# Patient Record
Sex: Male | Born: 1935 | Race: Black or African American | Hispanic: No | Marital: Married | Smoking: Former smoker
Health system: Southern US, Community
[De-identification: ages and names within clinical notes are randomized; demographics above are authoritative.]

## PROBLEM LIST (undated history)

## (undated) DIAGNOSIS — I714 Abdominal aortic aneurysm, without rupture, unspecified: Secondary | ICD-10-CM

## (undated) DIAGNOSIS — R066 Hiccough: Secondary | ICD-10-CM

## (undated) DIAGNOSIS — I10 Essential (primary) hypertension: Secondary | ICD-10-CM

## (undated) DIAGNOSIS — K449 Diaphragmatic hernia without obstruction or gangrene: Secondary | ICD-10-CM

## (undated) DIAGNOSIS — K5790 Diverticulosis of intestine, part unspecified, without perforation or abscess without bleeding: Secondary | ICD-10-CM

## (undated) DIAGNOSIS — N184 Chronic kidney disease, stage 4 (severe): Secondary | ICD-10-CM

## (undated) DIAGNOSIS — J449 Chronic obstructive pulmonary disease, unspecified: Secondary | ICD-10-CM

## (undated) DIAGNOSIS — I2510 Atherosclerotic heart disease of native coronary artery without angina pectoris: Secondary | ICD-10-CM

## (undated) DIAGNOSIS — K219 Gastro-esophageal reflux disease without esophagitis: Secondary | ICD-10-CM

## (undated) DIAGNOSIS — K222 Esophageal obstruction: Secondary | ICD-10-CM

## (undated) DIAGNOSIS — J850 Gangrene and necrosis of lung: Secondary | ICD-10-CM

## (undated) DIAGNOSIS — R911 Solitary pulmonary nodule: Secondary | ICD-10-CM

## (undated) DIAGNOSIS — D649 Anemia, unspecified: Secondary | ICD-10-CM

## (undated) DIAGNOSIS — C61 Malignant neoplasm of prostate: Secondary | ICD-10-CM

## (undated) DIAGNOSIS — I480 Paroxysmal atrial fibrillation: Secondary | ICD-10-CM

## (undated) HISTORY — DX: Abdominal aortic aneurysm, without rupture: I71.4

## (undated) HISTORY — DX: Gangrene and necrosis of lung: J85.0

## (undated) HISTORY — DX: Atherosclerotic heart disease of native coronary artery without angina pectoris: I25.10

## (undated) HISTORY — PX: CORONARY STENT PLACEMENT: SHX1402

## (undated) HISTORY — DX: Anemia, unspecified: D64.9

## (undated) HISTORY — DX: Essential (primary) hypertension: I10

## (undated) HISTORY — DX: Chronic obstructive pulmonary disease, unspecified: J44.9

## (undated) HISTORY — DX: Hiccough: R06.6

## (undated) HISTORY — DX: Abdominal aortic aneurysm, without rupture, unspecified: I71.40

## (undated) HISTORY — DX: Diaphragmatic hernia without obstruction or gangrene: K44.9

## (undated) HISTORY — DX: Esophageal obstruction: K22.2

## (undated) HISTORY — DX: Solitary pulmonary nodule: R91.1

---

## 1998-01-28 ENCOUNTER — Ambulatory Visit (HOSPITAL_COMMUNITY): Admission: RE | Admit: 1998-01-28 | Discharge: 1998-01-28 | Payer: Self-pay | Admitting: Gastroenterology

## 1998-02-14 ENCOUNTER — Ambulatory Visit (HOSPITAL_COMMUNITY): Admission: RE | Admit: 1998-02-14 | Discharge: 1998-02-14 | Payer: Self-pay | Admitting: Gastroenterology

## 1998-09-18 ENCOUNTER — Encounter: Payer: Self-pay | Admitting: Gastroenterology

## 1998-09-18 ENCOUNTER — Ambulatory Visit (HOSPITAL_COMMUNITY): Admission: RE | Admit: 1998-09-18 | Discharge: 1998-09-18 | Payer: Self-pay | Admitting: Gastroenterology

## 1998-10-02 ENCOUNTER — Ambulatory Visit (HOSPITAL_COMMUNITY): Admission: RE | Admit: 1998-10-02 | Discharge: 1998-10-02 | Payer: Self-pay | Admitting: Gastroenterology

## 1998-11-04 ENCOUNTER — Ambulatory Visit (HOSPITAL_COMMUNITY): Admission: RE | Admit: 1998-11-04 | Discharge: 1998-11-04 | Payer: Self-pay | Admitting: Gastroenterology

## 1998-12-02 ENCOUNTER — Ambulatory Visit (HOSPITAL_COMMUNITY): Admission: RE | Admit: 1998-12-02 | Discharge: 1998-12-02 | Payer: Self-pay | Admitting: Gastroenterology

## 1999-05-27 ENCOUNTER — Ambulatory Visit (HOSPITAL_COMMUNITY): Admission: RE | Admit: 1999-05-27 | Discharge: 1999-05-27 | Payer: Self-pay | Admitting: Gastroenterology

## 1999-07-22 ENCOUNTER — Ambulatory Visit (HOSPITAL_COMMUNITY): Admission: RE | Admit: 1999-07-22 | Discharge: 1999-07-22 | Payer: Self-pay | Admitting: Gastroenterology

## 2000-01-28 ENCOUNTER — Ambulatory Visit (HOSPITAL_COMMUNITY): Admission: RE | Admit: 2000-01-28 | Discharge: 2000-01-28 | Payer: Self-pay | Admitting: Gastroenterology

## 2000-04-05 ENCOUNTER — Encounter: Admission: RE | Admit: 2000-04-05 | Discharge: 2000-04-05 | Payer: Self-pay | Admitting: Gastroenterology

## 2000-04-05 ENCOUNTER — Encounter: Payer: Self-pay | Admitting: Gastroenterology

## 2000-06-07 ENCOUNTER — Ambulatory Visit (HOSPITAL_COMMUNITY): Admission: RE | Admit: 2000-06-07 | Discharge: 2000-06-07 | Payer: Self-pay | Admitting: Gastroenterology

## 2000-10-26 ENCOUNTER — Ambulatory Visit (HOSPITAL_COMMUNITY): Admission: RE | Admit: 2000-10-26 | Discharge: 2000-10-26 | Payer: Self-pay | Admitting: Gastroenterology

## 2000-11-11 ENCOUNTER — Encounter: Payer: Self-pay | Admitting: Cardiology

## 2000-11-11 ENCOUNTER — Ambulatory Visit (HOSPITAL_COMMUNITY): Admission: RE | Admit: 2000-11-11 | Discharge: 2000-11-11 | Payer: Self-pay | Admitting: Cardiology

## 2001-03-06 ENCOUNTER — Encounter: Payer: Self-pay | Admitting: Cardiology

## 2001-03-06 ENCOUNTER — Ambulatory Visit (HOSPITAL_COMMUNITY): Admission: RE | Admit: 2001-03-06 | Discharge: 2001-03-06 | Payer: Self-pay | Admitting: Cardiology

## 2001-05-09 ENCOUNTER — Ambulatory Visit (HOSPITAL_COMMUNITY): Admission: RE | Admit: 2001-05-09 | Discharge: 2001-05-09 | Payer: Self-pay | Admitting: Gastroenterology

## 2001-07-20 ENCOUNTER — Ambulatory Visit (HOSPITAL_COMMUNITY): Admission: RE | Admit: 2001-07-20 | Discharge: 2001-07-20 | Payer: Self-pay | Admitting: Ophthalmology

## 2001-09-06 HISTORY — PX: EYE SURGERY: SHX253

## 2001-10-02 ENCOUNTER — Ambulatory Visit (HOSPITAL_COMMUNITY): Admission: RE | Admit: 2001-10-02 | Discharge: 2001-10-02 | Payer: Self-pay | Admitting: Ophthalmology

## 2001-11-21 ENCOUNTER — Ambulatory Visit (HOSPITAL_COMMUNITY): Admission: RE | Admit: 2001-11-21 | Discharge: 2001-11-21 | Payer: Self-pay | Admitting: Gastroenterology

## 2002-05-25 ENCOUNTER — Ambulatory Visit (HOSPITAL_COMMUNITY): Admission: RE | Admit: 2002-05-25 | Discharge: 2002-05-25 | Payer: Self-pay | Admitting: Gastroenterology

## 2002-10-18 ENCOUNTER — Ambulatory Visit (HOSPITAL_COMMUNITY): Admission: RE | Admit: 2002-10-18 | Discharge: 2002-10-18 | Payer: Self-pay | Admitting: Gastroenterology

## 2003-05-15 ENCOUNTER — Ambulatory Visit (HOSPITAL_COMMUNITY): Admission: RE | Admit: 2003-05-15 | Discharge: 2003-05-15 | Payer: Self-pay | Admitting: Gastroenterology

## 2003-07-07 ENCOUNTER — Emergency Department (HOSPITAL_COMMUNITY): Admission: EM | Admit: 2003-07-07 | Discharge: 2003-07-07 | Payer: Self-pay | Admitting: Emergency Medicine

## 2003-07-22 ENCOUNTER — Ambulatory Visit (HOSPITAL_COMMUNITY): Admission: RE | Admit: 2003-07-22 | Discharge: 2003-07-22 | Payer: Self-pay | Admitting: Cardiology

## 2003-07-31 ENCOUNTER — Encounter: Admission: RE | Admit: 2003-07-31 | Discharge: 2003-07-31 | Payer: Self-pay | Admitting: General Surgery

## 2003-08-02 ENCOUNTER — Encounter: Admission: RE | Admit: 2003-08-02 | Discharge: 2003-08-02 | Payer: Self-pay | Admitting: General Surgery

## 2003-09-27 ENCOUNTER — Ambulatory Visit (HOSPITAL_COMMUNITY): Admission: RE | Admit: 2003-09-27 | Discharge: 2003-09-27 | Payer: Self-pay | Admitting: *Deleted

## 2003-11-20 ENCOUNTER — Inpatient Hospital Stay (HOSPITAL_COMMUNITY): Admission: RE | Admit: 2003-11-20 | Discharge: 2003-11-28 | Payer: Self-pay | Admitting: *Deleted

## 2003-12-12 ENCOUNTER — Encounter: Admission: RE | Admit: 2003-12-12 | Discharge: 2003-12-12 | Payer: Self-pay | Admitting: Gastroenterology

## 2003-12-16 ENCOUNTER — Ambulatory Visit (HOSPITAL_COMMUNITY): Admission: RE | Admit: 2003-12-16 | Discharge: 2003-12-16 | Payer: Self-pay | Admitting: Gastroenterology

## 2004-04-07 ENCOUNTER — Ambulatory Visit (HOSPITAL_COMMUNITY): Admission: RE | Admit: 2004-04-07 | Discharge: 2004-04-07 | Payer: Self-pay | Admitting: Gastroenterology

## 2004-08-04 ENCOUNTER — Ambulatory Visit (HOSPITAL_COMMUNITY): Admission: RE | Admit: 2004-08-04 | Discharge: 2004-08-04 | Payer: Self-pay | Admitting: Gastroenterology

## 2005-01-05 ENCOUNTER — Ambulatory Visit (HOSPITAL_COMMUNITY): Admission: RE | Admit: 2005-01-05 | Discharge: 2005-01-05 | Payer: Self-pay | Admitting: Gastroenterology

## 2005-03-01 ENCOUNTER — Encounter: Admission: RE | Admit: 2005-03-01 | Discharge: 2005-03-01 | Payer: Self-pay | Admitting: Cardiology

## 2005-06-29 ENCOUNTER — Ambulatory Visit (HOSPITAL_COMMUNITY): Admission: RE | Admit: 2005-06-29 | Discharge: 2005-06-29 | Payer: Self-pay | Admitting: Gastroenterology

## 2006-02-14 ENCOUNTER — Ambulatory Visit (HOSPITAL_COMMUNITY): Admission: RE | Admit: 2006-02-14 | Discharge: 2006-02-14 | Payer: Self-pay | Admitting: Gastroenterology

## 2006-02-14 ENCOUNTER — Encounter (INDEPENDENT_AMBULATORY_CARE_PROVIDER_SITE_OTHER): Payer: Self-pay | Admitting: Specialist

## 2006-06-15 ENCOUNTER — Ambulatory Visit (HOSPITAL_COMMUNITY): Admission: RE | Admit: 2006-06-15 | Discharge: 2006-06-15 | Payer: Self-pay | Admitting: Gastroenterology

## 2006-06-28 ENCOUNTER — Encounter: Admission: RE | Admit: 2006-06-28 | Discharge: 2006-06-28 | Payer: Self-pay | Admitting: Gastroenterology

## 2006-07-25 ENCOUNTER — Ambulatory Visit (HOSPITAL_COMMUNITY): Admission: RE | Admit: 2006-07-25 | Discharge: 2006-07-25 | Payer: Self-pay | Admitting: Gastroenterology

## 2006-11-07 ENCOUNTER — Ambulatory Visit (HOSPITAL_COMMUNITY): Admission: RE | Admit: 2006-11-07 | Discharge: 2006-11-07 | Payer: Self-pay | Admitting: Gastroenterology

## 2007-05-29 ENCOUNTER — Ambulatory Visit (HOSPITAL_COMMUNITY): Admission: RE | Admit: 2007-05-29 | Discharge: 2007-05-29 | Payer: Self-pay | Admitting: Gastroenterology

## 2007-07-03 ENCOUNTER — Ambulatory Visit (HOSPITAL_COMMUNITY): Admission: RE | Admit: 2007-07-03 | Discharge: 2007-07-03 | Payer: Self-pay | Admitting: Gastroenterology

## 2007-07-31 ENCOUNTER — Ambulatory Visit (HOSPITAL_COMMUNITY): Admission: RE | Admit: 2007-07-31 | Discharge: 2007-07-31 | Payer: Self-pay | Admitting: Gastroenterology

## 2007-09-11 ENCOUNTER — Inpatient Hospital Stay (HOSPITAL_COMMUNITY): Admission: EM | Admit: 2007-09-11 | Discharge: 2007-09-15 | Payer: Self-pay | Admitting: Emergency Medicine

## 2007-10-07 ENCOUNTER — Inpatient Hospital Stay (HOSPITAL_COMMUNITY): Admission: EM | Admit: 2007-10-07 | Discharge: 2007-10-18 | Payer: Self-pay | Admitting: Emergency Medicine

## 2008-02-19 ENCOUNTER — Inpatient Hospital Stay (HOSPITAL_COMMUNITY): Admission: AD | Admit: 2008-02-19 | Discharge: 2008-03-05 | Payer: Self-pay | Admitting: Gastroenterology

## 2008-02-29 ENCOUNTER — Ambulatory Visit: Payer: Self-pay | Admitting: Internal Medicine

## 2008-07-29 IMAGING — CR DG CHEST 1V PORT
2 series · 2 of 2 positions shown · non-contrast
Comparison: 09/12/07.

CLINICAL DATA: Atrial fibrillation and hypotension.  Syncope yesterday.  
AP PORTABLE SEMIERECT CHEST ? 1 VIEW 10/07/07 AT [DATE]:

[AP (1 of 2)]
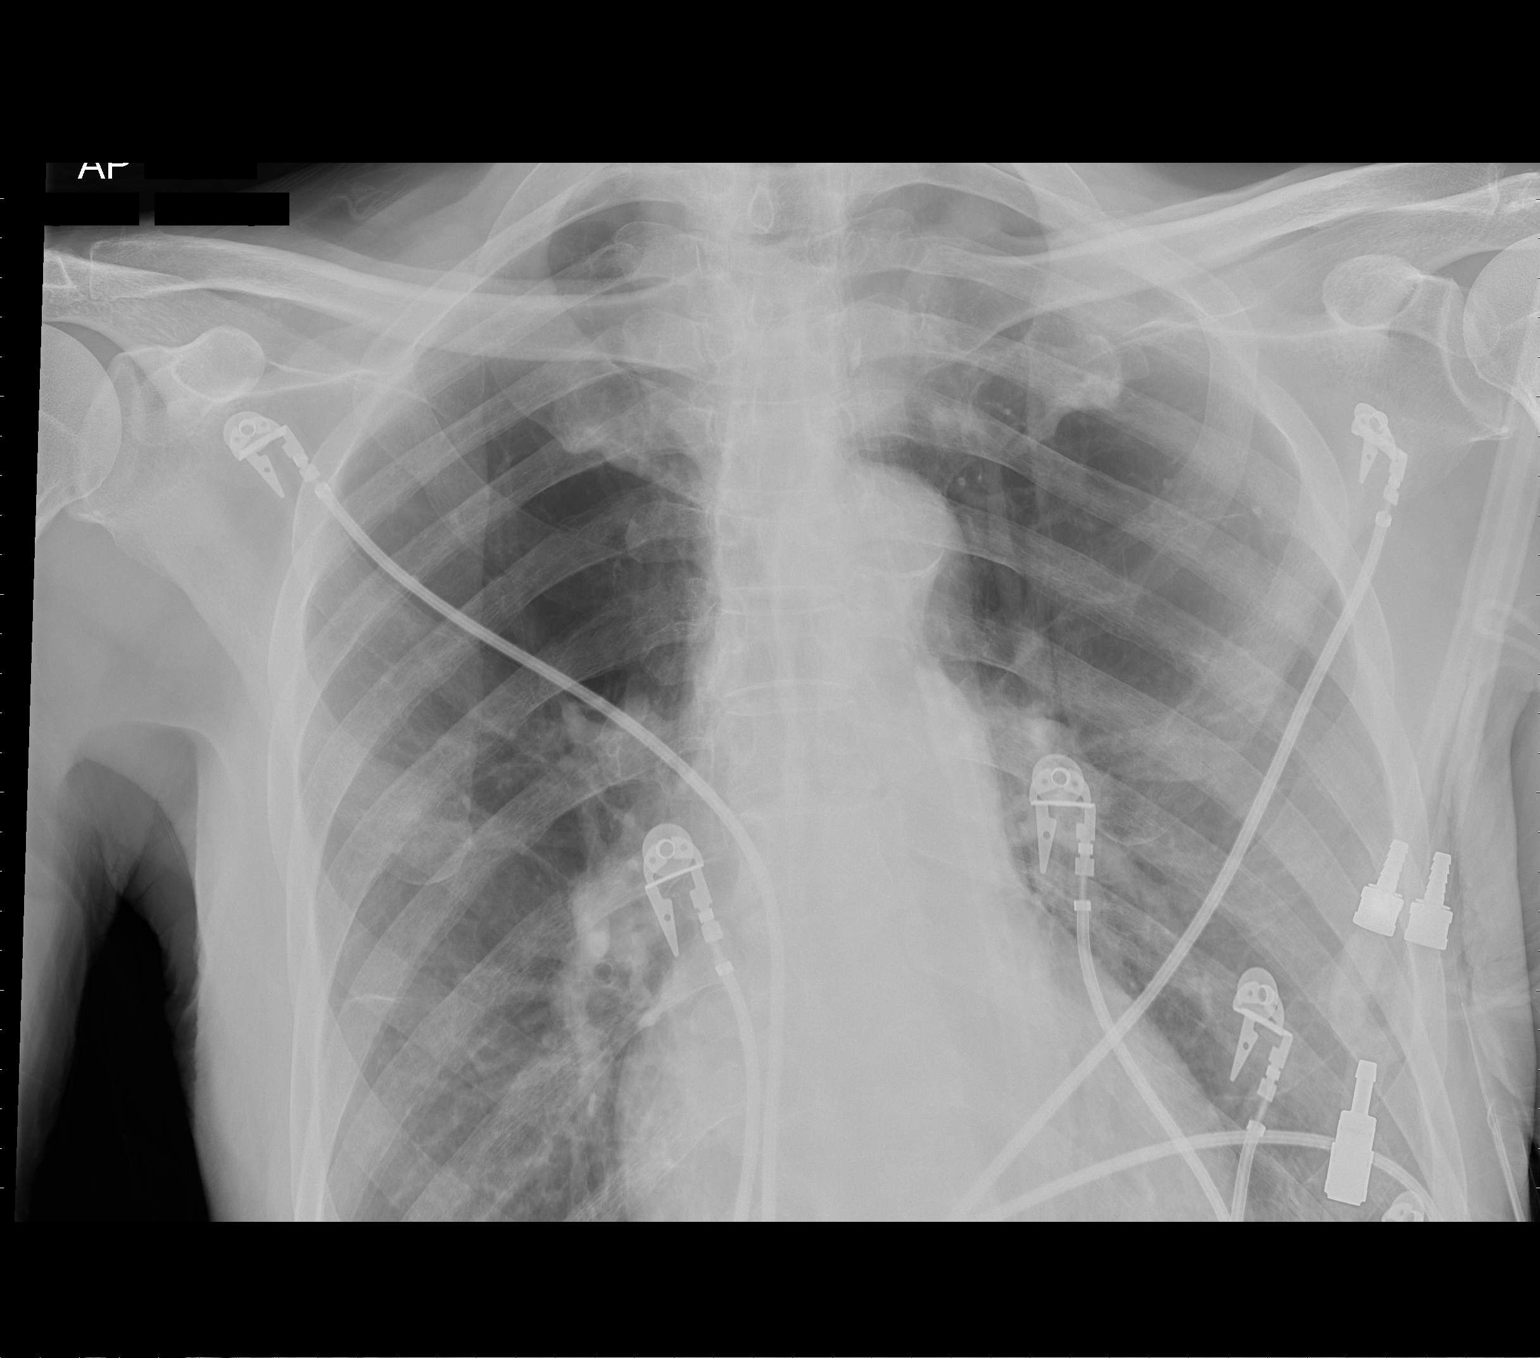

[AP (2 of 2)]
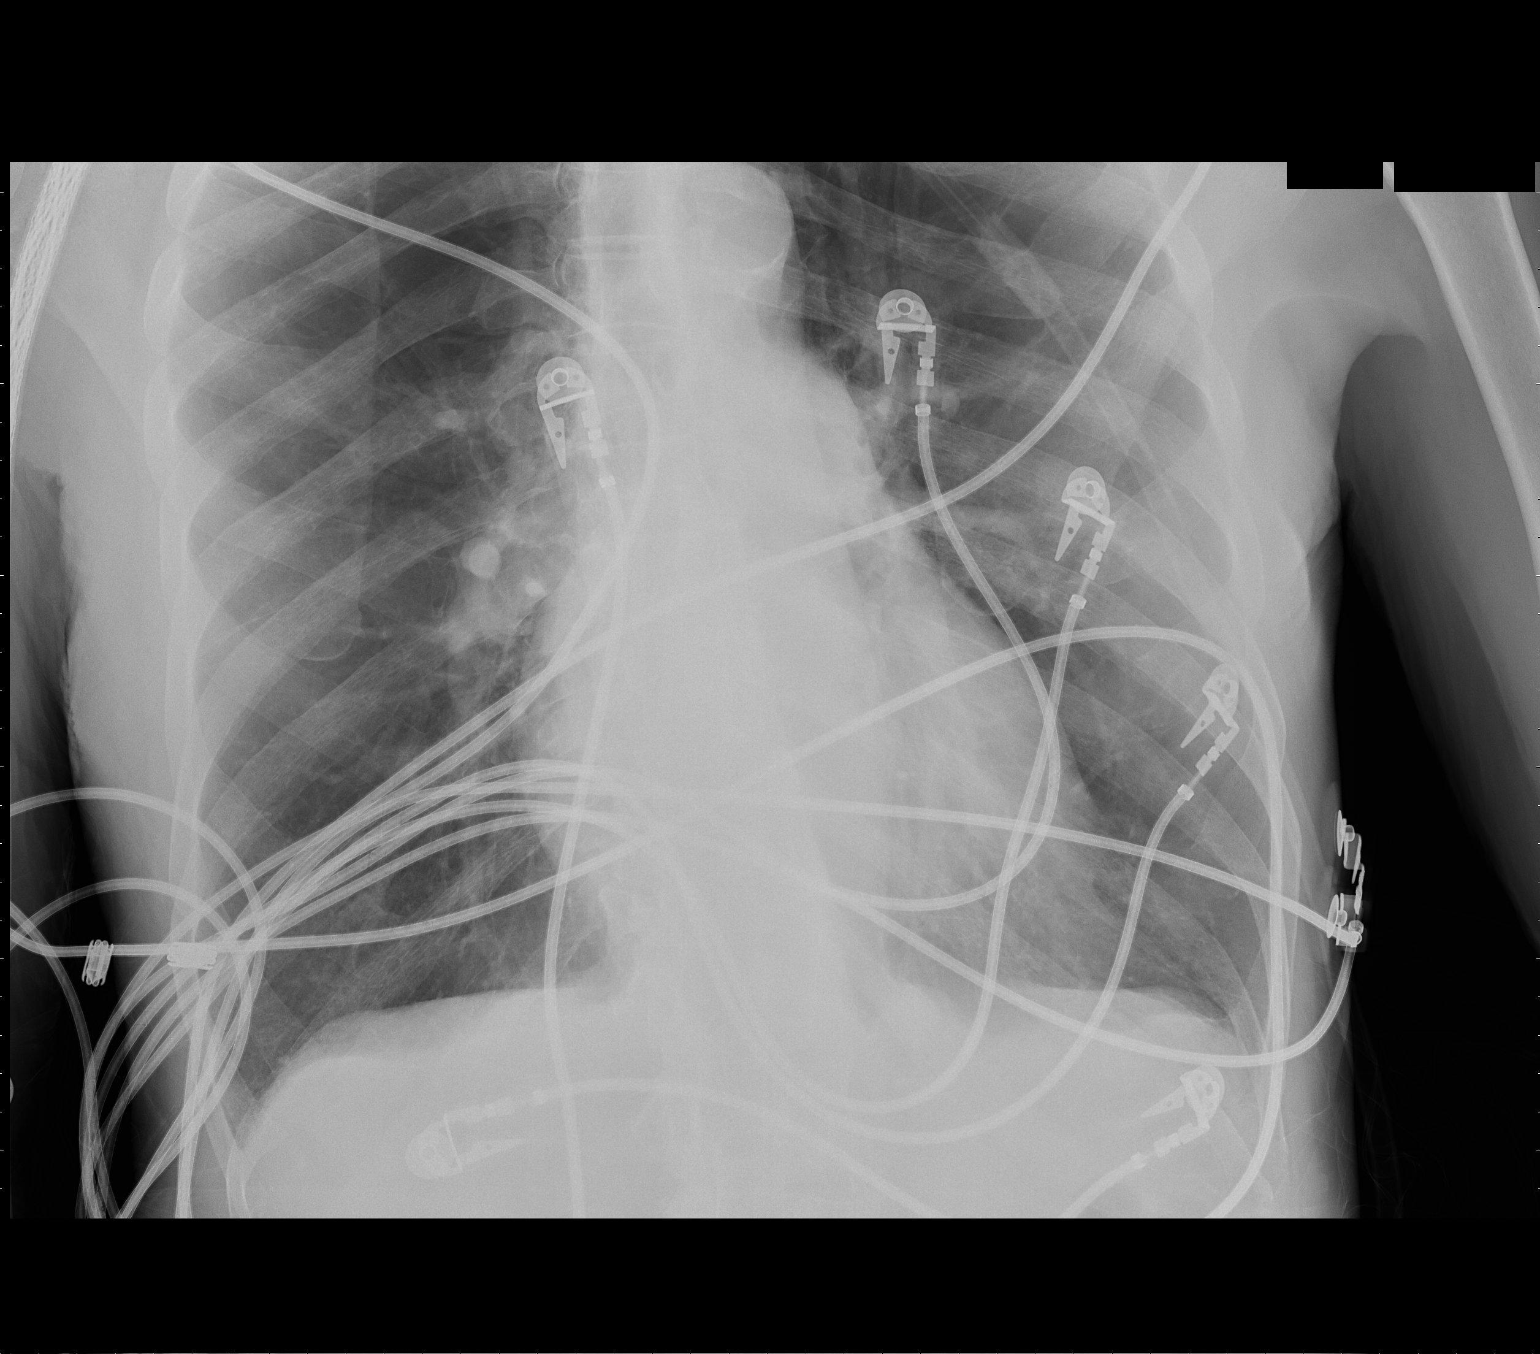

[2 of 2 positions shown; findings below may reference images not displayed]

FINDINGS: No infiltrate or congestive heart failure.  Mild central pulmonary vascular prominence.  No pneumothorax.  Minimal tortuous aorta.  Heart is slightly enlarged.
IMPRESSION: 1.  Mild cardiomegaly.  Mild central pulmonary vascular prominence.
2.  Minimal tortuous aorta.  
3.  No infiltrate, congestive heart failure or pneumothorax.

## 2008-09-10 ENCOUNTER — Inpatient Hospital Stay (HOSPITAL_BASED_OUTPATIENT_CLINIC_OR_DEPARTMENT_OTHER): Admission: RE | Admit: 2008-09-10 | Discharge: 2008-09-10 | Payer: Self-pay | Admitting: Cardiology

## 2008-09-18 ENCOUNTER — Ambulatory Visit (HOSPITAL_COMMUNITY): Admission: RE | Admit: 2008-09-18 | Discharge: 2008-09-18 | Payer: Self-pay | Admitting: Gastroenterology

## 2008-10-25 ENCOUNTER — Encounter (HOSPITAL_COMMUNITY): Admission: RE | Admit: 2008-10-25 | Discharge: 2009-01-23 | Payer: Self-pay | Admitting: Cardiology

## 2008-11-15 ENCOUNTER — Ambulatory Visit (HOSPITAL_COMMUNITY): Admission: RE | Admit: 2008-11-15 | Discharge: 2008-11-15 | Payer: Self-pay | Admitting: Gastroenterology

## 2008-12-17 ENCOUNTER — Ambulatory Visit (HOSPITAL_COMMUNITY): Admission: RE | Admit: 2008-12-17 | Discharge: 2008-12-17 | Payer: Self-pay | Admitting: Gastroenterology

## 2009-02-19 ENCOUNTER — Ambulatory Visit (HOSPITAL_COMMUNITY): Admission: RE | Admit: 2009-02-19 | Discharge: 2009-02-19 | Payer: Self-pay | Admitting: Gastroenterology

## 2009-03-14 ENCOUNTER — Encounter (HOSPITAL_COMMUNITY): Admission: RE | Admit: 2009-03-14 | Discharge: 2009-06-09 | Payer: Self-pay | Admitting: Cardiology

## 2009-03-20 ENCOUNTER — Inpatient Hospital Stay (HOSPITAL_COMMUNITY): Admission: EM | Admit: 2009-03-20 | Discharge: 2009-04-04 | Payer: Self-pay | Admitting: *Deleted

## 2009-03-20 ENCOUNTER — Ambulatory Visit: Payer: Self-pay | Admitting: Pulmonary Disease

## 2009-03-20 DIAGNOSIS — J850 Gangrene and necrosis of lung: Secondary | ICD-10-CM

## 2009-03-20 HISTORY — DX: Gangrene and necrosis of lung: J85.0

## 2009-04-02 ENCOUNTER — Encounter (INDEPENDENT_AMBULATORY_CARE_PROVIDER_SITE_OTHER): Payer: Self-pay | Admitting: Cardiology

## 2009-04-03 ENCOUNTER — Ambulatory Visit: Payer: Self-pay | Admitting: Physical Medicine & Rehabilitation

## 2009-04-04 ENCOUNTER — Inpatient Hospital Stay (HOSPITAL_COMMUNITY)
Admission: RE | Admit: 2009-04-04 | Discharge: 2009-04-16 | Payer: Self-pay | Admitting: Physical Medicine & Rehabilitation

## 2009-04-04 ENCOUNTER — Ambulatory Visit: Payer: Self-pay | Admitting: Physical Medicine & Rehabilitation

## 2009-04-16 ENCOUNTER — Encounter: Payer: Self-pay | Admitting: Internal Medicine

## 2009-04-21 DIAGNOSIS — J449 Chronic obstructive pulmonary disease, unspecified: Secondary | ICD-10-CM

## 2009-04-21 DIAGNOSIS — I10 Essential (primary) hypertension: Secondary | ICD-10-CM | POA: Insufficient documentation

## 2009-04-21 DIAGNOSIS — I4891 Unspecified atrial fibrillation: Secondary | ICD-10-CM | POA: Insufficient documentation

## 2009-04-21 DIAGNOSIS — I2510 Atherosclerotic heart disease of native coronary artery without angina pectoris: Secondary | ICD-10-CM | POA: Insufficient documentation

## 2009-04-22 ENCOUNTER — Ambulatory Visit: Payer: Self-pay | Admitting: Internal Medicine

## 2009-04-22 DIAGNOSIS — J852 Abscess of lung without pneumonia: Secondary | ICD-10-CM | POA: Insufficient documentation

## 2009-04-24 ENCOUNTER — Ambulatory Visit: Payer: Self-pay | Admitting: Cardiology

## 2009-04-24 ENCOUNTER — Telehealth (INDEPENDENT_AMBULATORY_CARE_PROVIDER_SITE_OTHER): Payer: Self-pay | Admitting: *Deleted

## 2009-06-24 ENCOUNTER — Encounter: Payer: Self-pay | Admitting: Internal Medicine

## 2009-06-24 ENCOUNTER — Ambulatory Visit: Payer: Self-pay | Admitting: Internal Medicine

## 2009-07-01 ENCOUNTER — Ambulatory Visit (HOSPITAL_COMMUNITY): Admission: RE | Admit: 2009-07-01 | Discharge: 2009-07-01 | Payer: Self-pay | Admitting: Ophthalmology

## 2009-08-07 ENCOUNTER — Ambulatory Visit: Payer: Self-pay | Admitting: Internal Medicine

## 2009-08-11 ENCOUNTER — Ambulatory Visit: Payer: Self-pay | Admitting: Internal Medicine

## 2009-08-11 ENCOUNTER — Ambulatory Visit: Payer: Self-pay | Admitting: Cardiology

## 2009-08-11 DIAGNOSIS — J984 Other disorders of lung: Secondary | ICD-10-CM

## 2009-08-11 DIAGNOSIS — R1319 Other dysphagia: Secondary | ICD-10-CM

## 2009-08-12 ENCOUNTER — Telehealth: Payer: Self-pay | Admitting: Internal Medicine

## 2009-08-12 ENCOUNTER — Encounter: Payer: Self-pay | Admitting: Internal Medicine

## 2009-08-13 ENCOUNTER — Telehealth (INDEPENDENT_AMBULATORY_CARE_PROVIDER_SITE_OTHER): Payer: Self-pay | Admitting: *Deleted

## 2009-08-14 ENCOUNTER — Encounter: Admission: RE | Admit: 2009-08-14 | Discharge: 2009-08-14 | Payer: Self-pay | Admitting: Gastroenterology

## 2009-08-14 ENCOUNTER — Encounter: Payer: Self-pay | Admitting: Internal Medicine

## 2009-09-04 ENCOUNTER — Ambulatory Visit (HOSPITAL_COMMUNITY): Admission: RE | Admit: 2009-09-04 | Discharge: 2009-09-04 | Payer: Self-pay | Admitting: Gastroenterology

## 2009-09-08 ENCOUNTER — Ambulatory Visit (HOSPITAL_COMMUNITY): Admission: RE | Admit: 2009-09-08 | Discharge: 2009-09-08 | Payer: Self-pay | Admitting: Gastroenterology

## 2009-10-06 ENCOUNTER — Telehealth: Payer: Self-pay | Admitting: Internal Medicine

## 2009-10-06 ENCOUNTER — Ambulatory Visit: Payer: Self-pay | Admitting: Internal Medicine

## 2009-10-14 ENCOUNTER — Encounter: Admission: RE | Admit: 2009-10-14 | Discharge: 2009-10-14 | Payer: Self-pay | Admitting: Cardiology

## 2009-10-27 ENCOUNTER — Encounter: Payer: Self-pay | Admitting: Internal Medicine

## 2009-10-31 ENCOUNTER — Ambulatory Visit: Payer: Self-pay | Admitting: Thoracic Surgery

## 2009-11-04 ENCOUNTER — Ambulatory Visit (HOSPITAL_COMMUNITY): Admission: RE | Admit: 2009-11-04 | Discharge: 2009-11-04 | Payer: Self-pay | Admitting: Thoracic Surgery

## 2009-11-05 ENCOUNTER — Ambulatory Visit: Payer: Self-pay | Admitting: Thoracic Surgery

## 2009-11-07 ENCOUNTER — Ambulatory Visit (HOSPITAL_COMMUNITY): Admission: RE | Admit: 2009-11-07 | Discharge: 2009-11-07 | Payer: Self-pay | Admitting: Thoracic Surgery

## 2009-11-11 ENCOUNTER — Ambulatory Visit: Payer: Self-pay | Admitting: Thoracic Surgery

## 2009-11-13 ENCOUNTER — Telehealth: Payer: Self-pay | Admitting: Internal Medicine

## 2009-11-20 ENCOUNTER — Ambulatory Visit: Payer: Self-pay | Admitting: Internal Medicine

## 2009-11-21 ENCOUNTER — Telehealth (INDEPENDENT_AMBULATORY_CARE_PROVIDER_SITE_OTHER): Payer: Self-pay | Admitting: *Deleted

## 2009-12-02 ENCOUNTER — Ambulatory Visit (HOSPITAL_COMMUNITY): Admission: RE | Admit: 2009-12-02 | Discharge: 2009-12-02 | Payer: Self-pay | Admitting: Gastroenterology

## 2009-12-30 ENCOUNTER — Encounter (HOSPITAL_COMMUNITY): Admission: RE | Admit: 2009-12-30 | Discharge: 2010-03-30 | Payer: Self-pay | Admitting: Internal Medicine

## 2010-01-07 ENCOUNTER — Encounter: Payer: Self-pay | Admitting: Internal Medicine

## 2010-01-07 ENCOUNTER — Encounter: Admission: RE | Admit: 2010-01-07 | Discharge: 2010-01-07 | Payer: Self-pay | Admitting: Thoracic Surgery

## 2010-01-07 ENCOUNTER — Ambulatory Visit: Payer: Self-pay | Admitting: Thoracic Surgery

## 2010-01-23 ENCOUNTER — Ambulatory Visit: Payer: Self-pay | Admitting: Internal Medicine

## 2010-01-28 IMAGING — RF DG ESOPHAGUS
12 series · 14 of 14 positions shown · non-contrast
Comparison: Chest CT 03/26/2009 and diagnostic esophagram
06/28/2006.

CLINICAL DATA: Pneumonia.  History of esophageal stricture and
percutaneous G tube placed 02/20/2008

LIMITED ESOPHOGRAM / BARIUM SWALLOW
TECHNIQUE: Single contrast examination was performed using
Mmnipaque-CHH and thin barium.
Fluoroscopy time:  1.5 minutes.

[Series 1: run · 3 of 3 slices shown (1 of 12)]
[im 1/3]
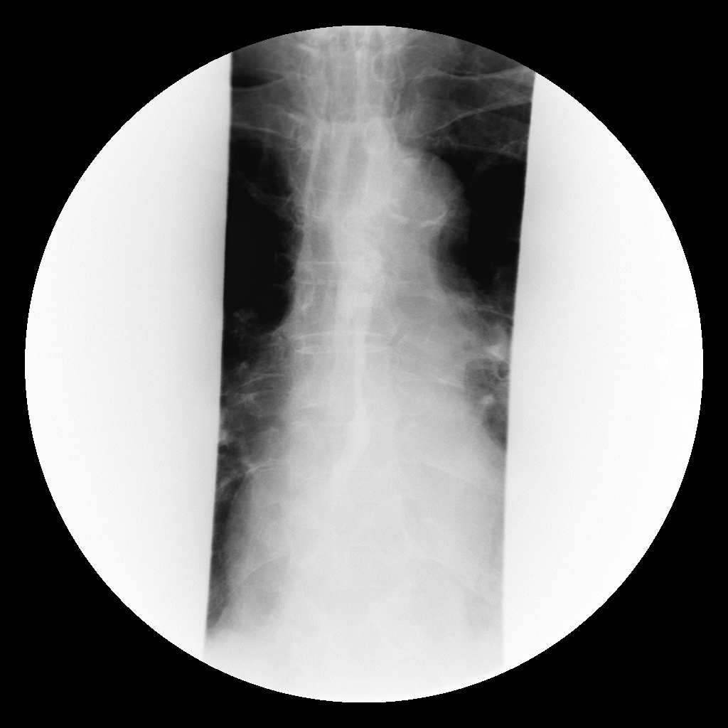
[im 2/3]
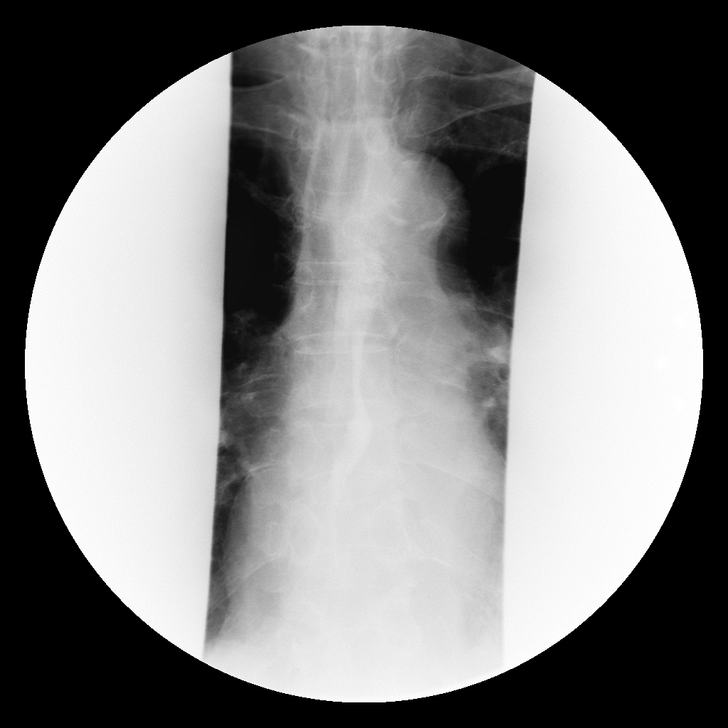
[im 3/3]
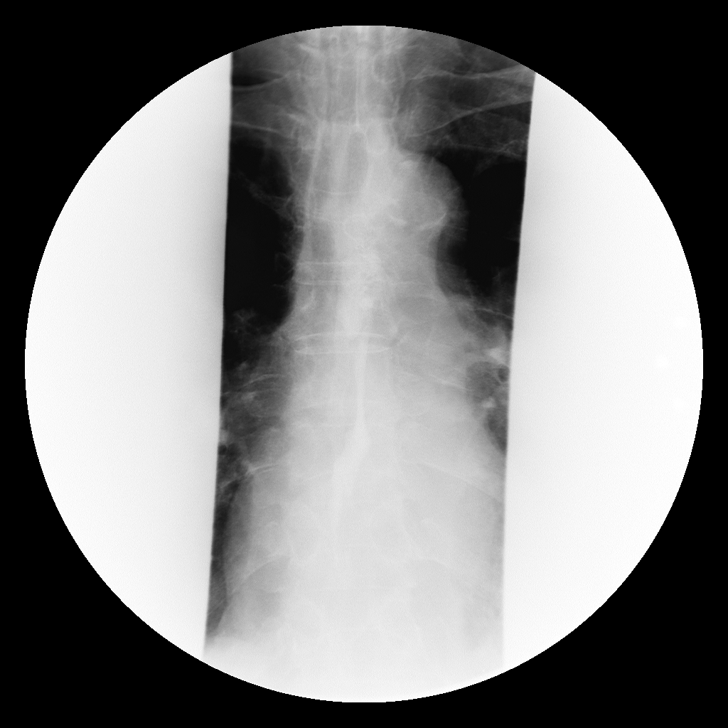

[Series 2: run · 1 of 1 slices shown (2 of 12)]
[im 1/1]
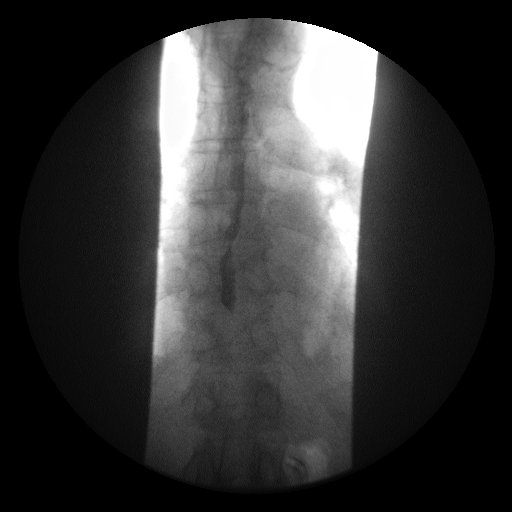

[Series 3: run · 1 of 1 slices shown (3 of 12)]
[im 1/1]
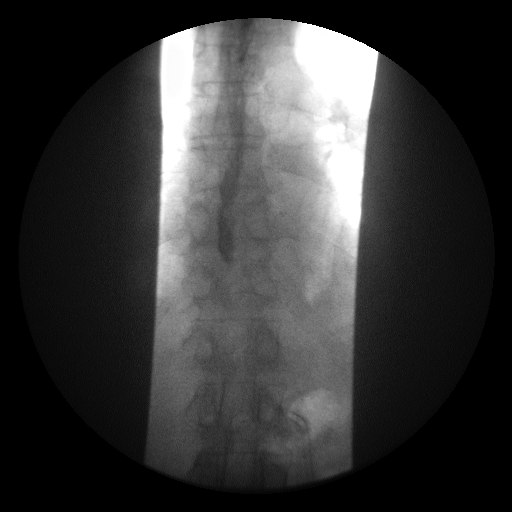

[Series 4: run · 1 of 1 slices shown (4 of 12)]
[im 1/1]
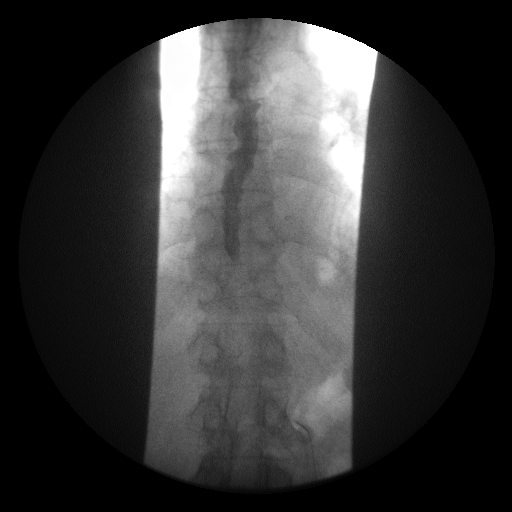

[Series 5: run · 1 of 1 slices shown (5 of 12)]
[im 1/1]
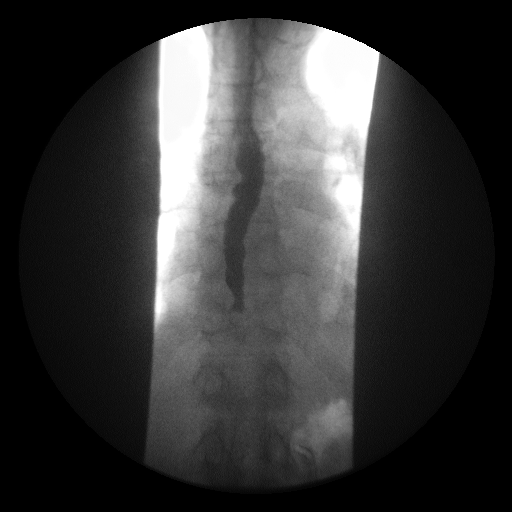

[Series 6: run · 1 of 1 slices shown (6 of 12)]
[im 1/1]
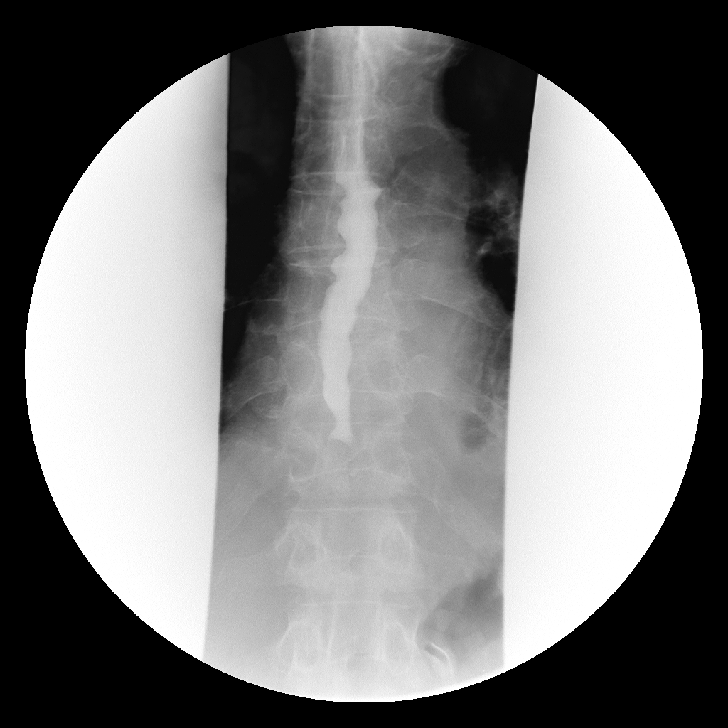

[Series 7: run · 1 of 1 slices shown (7 of 12)]
[im 1/1]
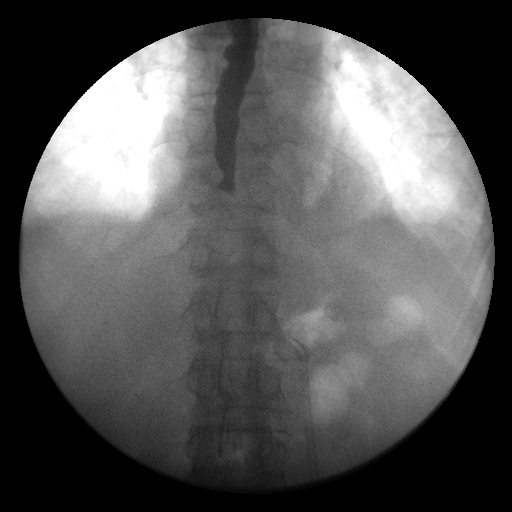

[Series 8: run · 1 of 1 slices shown (8 of 12)]
[im 1/1]
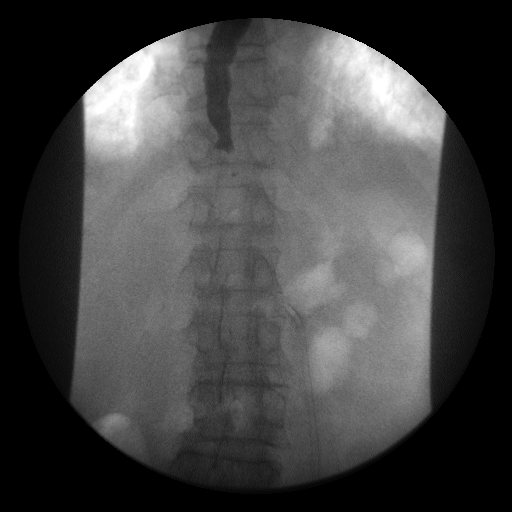

[Series 9: run · 1 of 1 slices shown (9 of 12)]
[im 1/1]
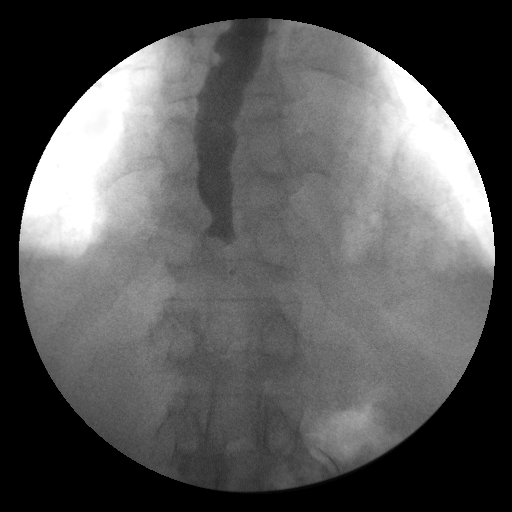

[Series 10: run · 1 of 1 slices shown (10 of 12)]
[im 1/1]
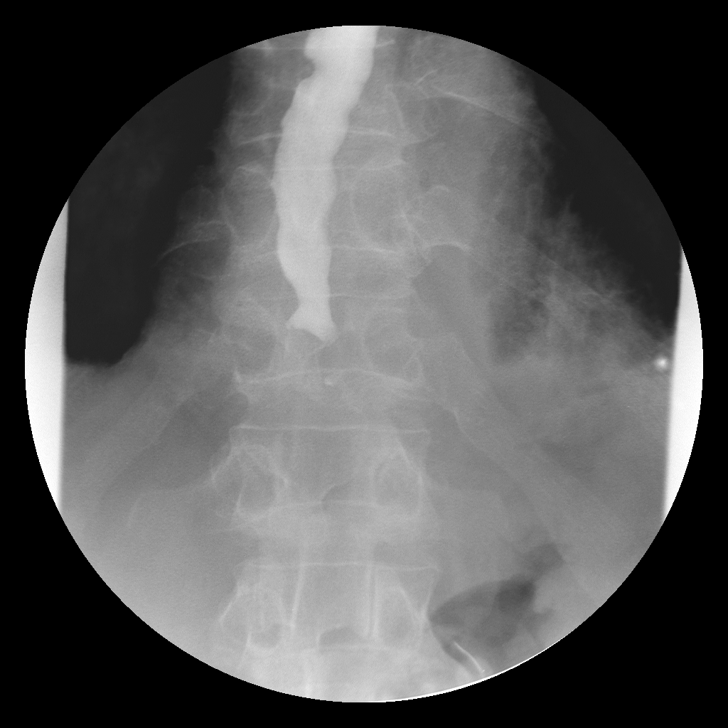

[Series 11: run · 1 of 1 slices shown (11 of 12)]
[im 1/1]
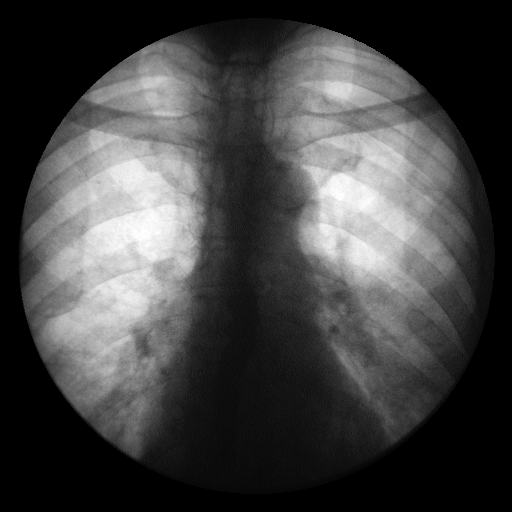

[Series 12: run · 1 of 1 slices shown (12 of 12)]
[im 1/1]
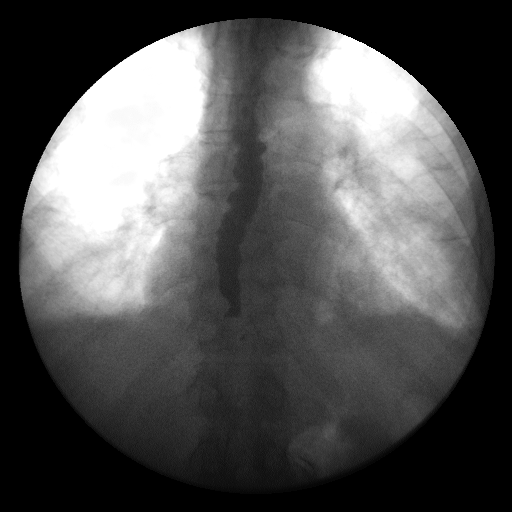

[14 of 14 positions shown; findings below may reference images not displayed]

FINDINGS: In discussion with the patient, he reports not taking
fluids or solids per mouth since the placement of the percutaneous
G tube.  Because of this, the study was initiated using small
volumes of Mmnipaque-CHH.  This passed into the thoracic esophagus,
but do not pass into the stomach.  No aspiration into the
tracheobronchial tree was demonstrated.  This was followed by small
volumes of thin barium.  Similar results were obtained.  There was
no passage of contrast material into the stomach.  Some spasm of
the thoracic esophagus was noted.
IMPRESSION: Non patency of the distal esophagus compatible with chronic
esophageal obstruction.  No demonstrated aspiration into the
tracheobronchial tree.

## 2010-02-01 IMAGING — CR DG CHEST 2V
2 series · 2 of 2 positions shown · non-contrast
Comparison: Esophagram 04/07/2009 and chest x-ray 04/05/2009.  CT
chest 03/26/2009

CLINICAL DATA: Pneumonia, deconditioning.

CHEST - 2 VIEW

[w chest pa]
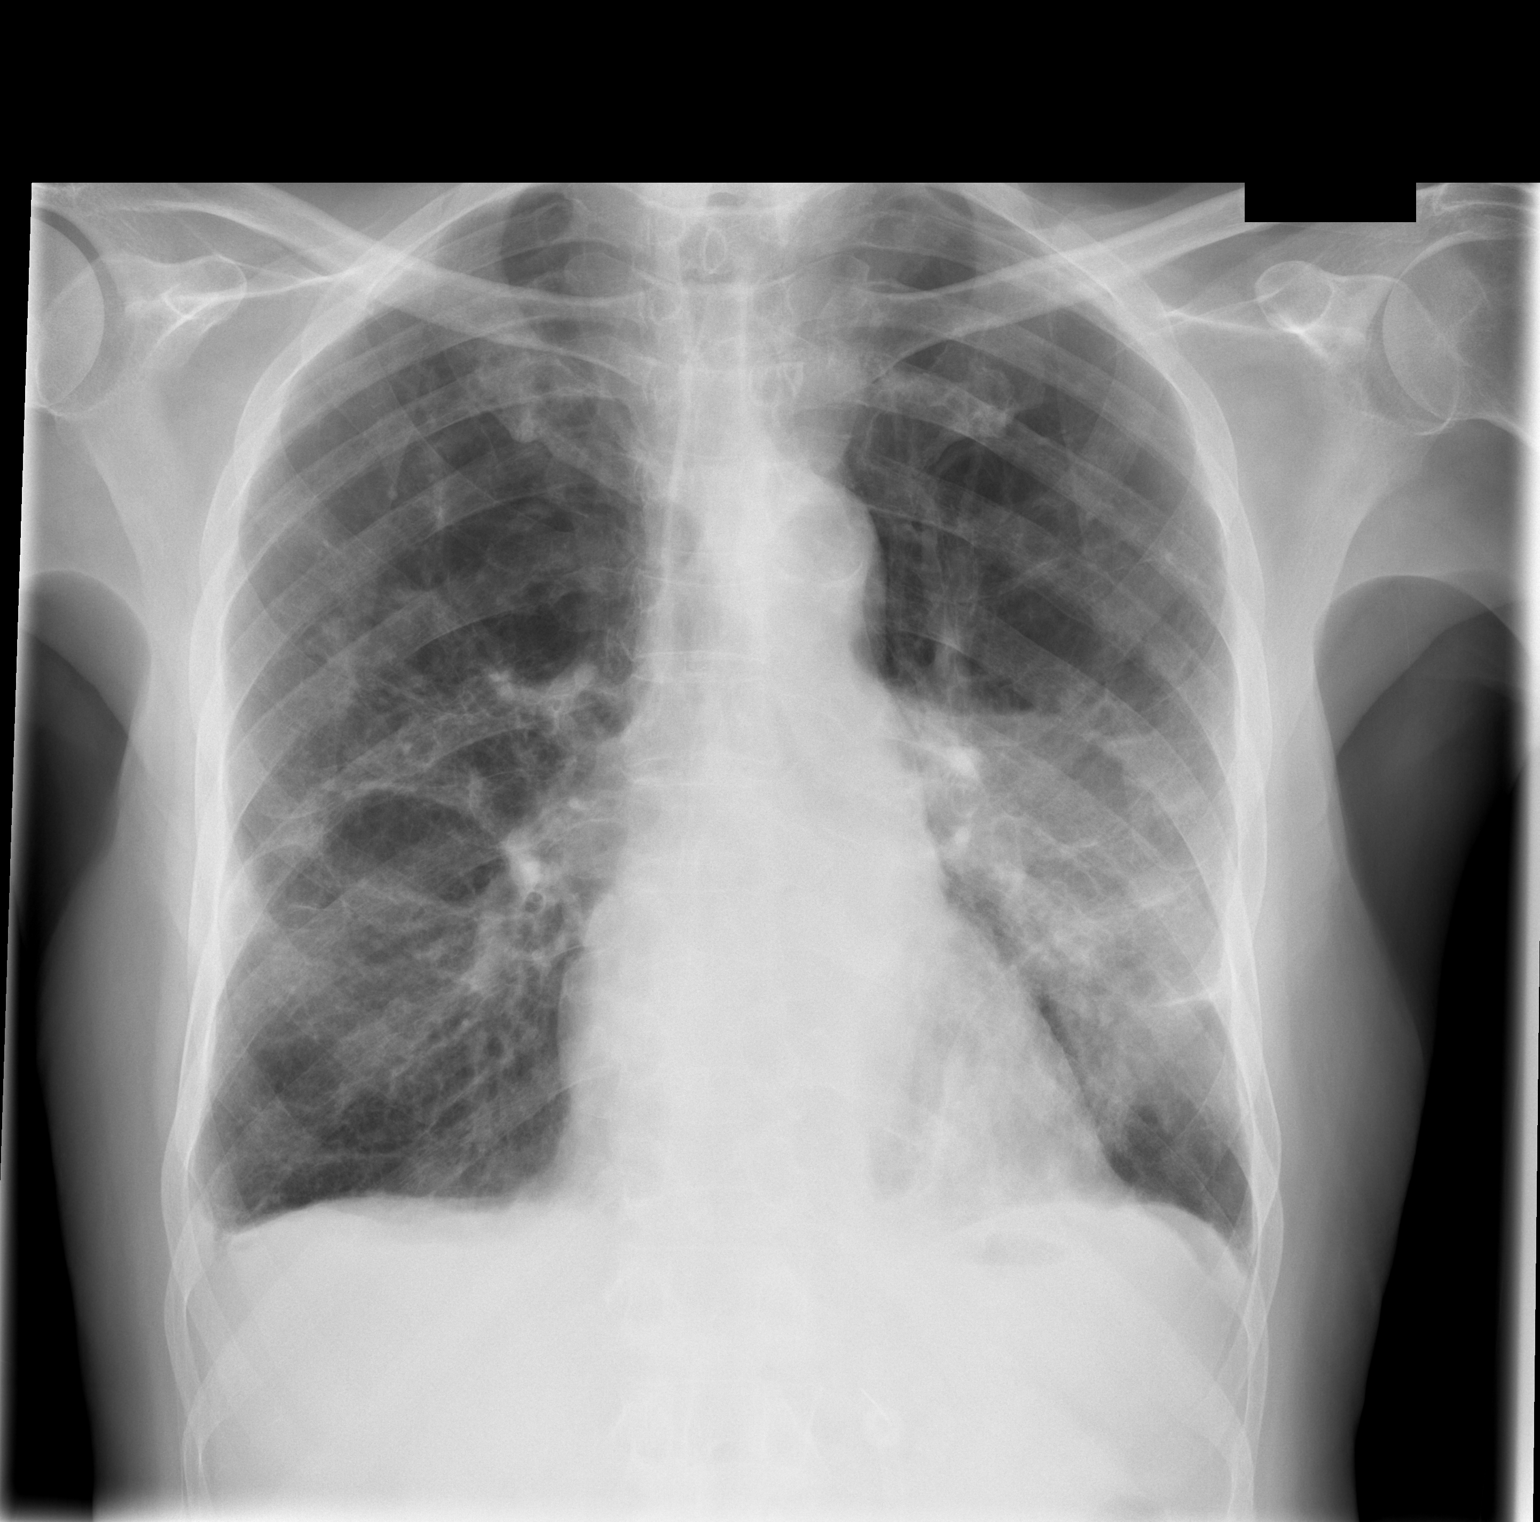

[w chest lat]
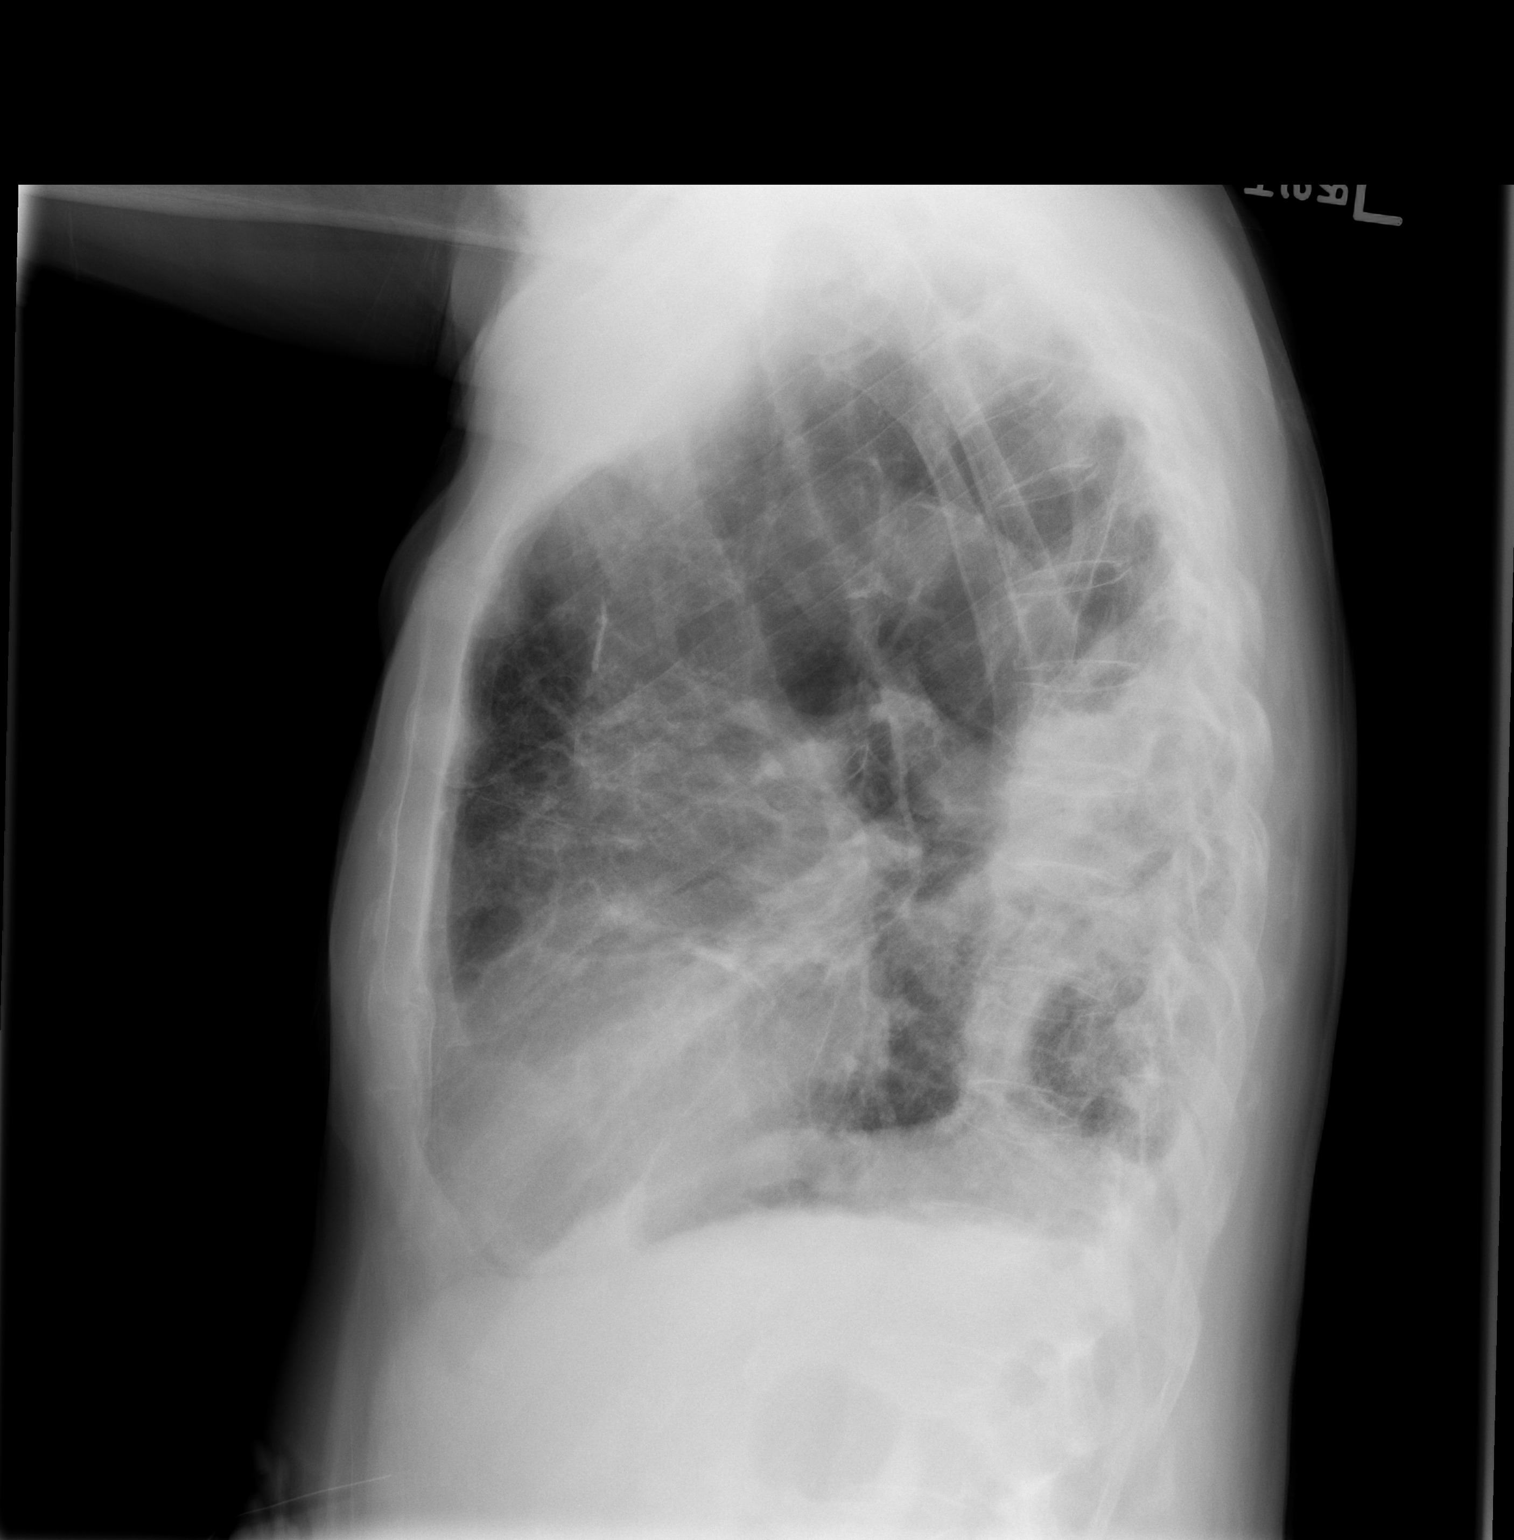

[2 of 2 positions shown; findings below may reference images not displayed]

FINDINGS: Trachea is midline.  Heart size normal.  Thoracic aorta
is calcified.  Lungs are severely emphysematous with persistent air
space consolidation in the left lower lobe.  Tiny bilateral pleural
effusions.
IMPRESSION: 1.  Left lower lobe pneumonia.  Follow-up to clearing is
recommended.
2.  Tiny bilateral pleural effusions.

## 2010-02-09 ENCOUNTER — Ambulatory Visit (HOSPITAL_COMMUNITY)
Admission: RE | Admit: 2010-02-09 | Discharge: 2010-02-09 | Payer: Self-pay | Source: Home / Self Care | Admitting: Cardiology

## 2010-03-31 ENCOUNTER — Encounter (HOSPITAL_COMMUNITY): Admission: RE | Admit: 2010-03-31 | Discharge: 2010-05-07 | Payer: Self-pay | Admitting: Internal Medicine

## 2010-06-09 ENCOUNTER — Ambulatory Visit: Payer: Self-pay | Admitting: Internal Medicine

## 2010-06-10 ENCOUNTER — Telehealth (INDEPENDENT_AMBULATORY_CARE_PROVIDER_SITE_OTHER): Payer: Self-pay | Admitting: *Deleted

## 2010-08-12 ENCOUNTER — Ambulatory Visit (HOSPITAL_BASED_OUTPATIENT_CLINIC_OR_DEPARTMENT_OTHER)
Admission: RE | Admit: 2010-08-12 | Discharge: 2010-08-12 | Payer: Self-pay | Source: Home / Self Care | Admitting: Cardiology

## 2010-09-09 ENCOUNTER — Ambulatory Visit (HOSPITAL_COMMUNITY)
Admission: RE | Admit: 2010-09-09 | Discharge: 2010-09-09 | Payer: Self-pay | Source: Home / Self Care | Attending: Gastroenterology | Admitting: Gastroenterology

## 2010-09-26 ENCOUNTER — Encounter: Payer: Self-pay | Admitting: Cardiology

## 2010-09-26 ENCOUNTER — Encounter (HOSPITAL_BASED_OUTPATIENT_CLINIC_OR_DEPARTMENT_OTHER): Payer: Self-pay | Admitting: General Surgery

## 2010-09-27 ENCOUNTER — Encounter: Payer: Self-pay | Admitting: Internal Medicine

## 2010-10-06 NOTE — Assessment & Plan Note (Signed)
Summary: F/U AFTER CT ///kp   Visit Type:  Follow-up Copy to:  Dr Shana Chute Primary Provider/Referring Provider:  Dr. Shana Chute PMD, Dr Arty Baumgartner - GI  CC:  Pt here for follow-up. pt reports breathing is well.  Pt c/o productive cough with dark grey.Marland Kitchen  History of Present Illness: OV 11/20/2009:  Followup LLL aspiration-necrotizing PNA (from August 2010 due to esophageal stricture and is s/p peg), Deconditioning (refused opd rehab), RUL nodule, COPD and weight loss. Since last visit, has continued with NPO status. HAs gained 7# weight to 140#. Feels well. No complaints. Uses spiriva. Had cxr n 10/14/2009 at Dr Spruill's office for dyspnea and noted to have a new LUL Nodule 2cm. Followup  with Dr. Edwyna Shell suggests this is pseudotumor (based on CT and PET data 3/1 and3/12/2009). REview of  Dr Edwyna Shell notes tells that he has recommended followup CXR in 2 months. PAtient watns to consolidate followup here. Followup CT also shows LLL nec pna has cleared compared to dec 2010. Alsol, LMB mucus has cleared but now has some in RMB mucus. CT also shows diffuse emphysmea. Of note, hemoptysis of prior viist has resolved  Current Medications (verified): 1)  Plavix 75 Mg Tabs (Clopidogrel Bisulfate) .... Take 1 Tablet By Mouth Once A Day 2)  Spiriva Handihaler 18 Mcg Caps (Tiotropium Bromide Monohydrate) .... One Inhalation Daily 3)  Baclofen 10 Mg Tabs (Baclofen) .... 5 Times A Day Before Meals 4)  Ferrous Sulfate 325 (65 Fe) Mg Tabs (Ferrous Sulfate) .... Take 1 Tablet By Mouth Two Times A Day 5)  Jevity  Liqd (Nutritional Supplements) .... At 7am, 11am, 3pm, 7pm and 10pm, Each Tube Flushes Past Tube Feeds. 6)  Nexium 40 Mg Cpdr (Esomeprazole Magnesium) .... Take 1 Tablet By Mouth Once A Day 7)  Tylenol With Codeine #3 300-30 Mg Tabs (Acetaminophen-Codeine) .... As Directed  Allergies (verified): No Known Drug Allergies  Past History:  Family History: Last updated: 04/22/2009 Pt has 10  siblings 1Sister-Breast cancer 1 Sister-cancer (unsure of what type) 3 Brothers-cancer(unsure of what type)  Social History: Last updated: 04/22/2009 Married Hx of ETOH abuse-quit 30 yrs ago Patient states former smoker x 30years. Quit in 2006  Risk Factors: Smoking Status: quit (04/22/2009) Packs/Day: 0.75 (04/22/2009)  Past Medical History: #ADMISSION 03/20/2009 - 04/16/2009 (medicne and rehab) for  1. Deconditioning in the setting of left lower lobe necrotizing pneumonia; on clinda/ciprp.  > [facility] CTs show clearing Dec 2010 and November 04, 2009  2. Hypertension.   3. History of esophageal stricture with chronic dysphagia.   4. Chronic hiccups.   5. Tube feed-induced diarrhea.   6. Chronic atrial fibrillation.   7. Iron-deficiency anemia.   8. Mild hyponatremia.  #Hypertension #Atrial Fibrillation #History of esophageal stricture and stenosis....Marland KitchenMarland KitchenDr Arty Baumgartner > Complete obstruction on barium esopghagram Jan 2011 > On strict NPO and s/p PEG tube #Chronic Hiccups #Coronary Heart Disease #C O P D on spiriva...................Marland KitchenRamaswamy > desaturated to 89% after 155 feet x 2 laps on 08/11/2009 >Gold stage 2 COPD on 08/11/2009 > PFT at Dr. Edwyna Shell office after clearance of LLL PNA:  His FVC was 2.66   with an FEV1 of 1.42 with a diffusion capacity of 22%.  There were   multiple bullae on the CT scan.  > Did not desaturate with exertion March 17. 2011 in pulm office #Hiatal Hernia #Myocardial Infarction-Jan 2009 #Abdominal Aneurysm #LUL Nodule 2cm  > showed up suddenly on CXR 10/14/2009 (new since Dec 2010 CT chest) >  Likely pseudotumor 3/12/011 CT chest. PET scan negative 11/07/2009  Past Surgical History: Reviewed history from 04/22/2009 and no changes required. Right Eye Macular Hole Repair 2003 Stent placed x 2  Family History: Reviewed history from 04/22/2009 and no changes required. Pt has 10 siblings 1Sister-Breast cancer 1 Sister-cancer (unsure of what type) 3  Brothers-cancer(unsure of what type)  Social History: Reviewed history from 04/22/2009 and no changes required. Married Hx of ETOH abuse-quit 30 yrs ago Patient states former smoker x 30years. Quit in 2006  Review of Systems       The patient complains of productive cough.  The patient denies shortness of breath with activity, shortness of breath at rest, non-productive cough, coughing up blood, chest pain, irregular heartbeats, acid heartburn, indigestion, loss of appetite, weight change, abdominal pain, difficulty swallowing, sore throat, tooth/dental problems, headaches, nasal congestion/difficulty breathing through nose, sneezing, itching, ear ache, anxiety, depression, hand/feet swelling, joint stiffness or pain, rash, change in color of mucus, and fever.    Vital Signs:  Patient profile:   77 year old male Height:      69 inches Weight:      140 pounds O2 Sat:      95 % on Room air Temp:     98.1 degrees F oral Pulse rate:   76 / minute BP sitting:   130 / 80  (right arm) Cuff size:   regular  Vitals Entered By: Carron Curie CMA (November 20, 2009 2:16 PM)  O2 Flow:  Room air  Serial Vital Signs/Assessments:  Comments: 2:59 PM Ambulatory Pulse Oximetry  Resting; HR__81___    02 Sat__96___  Lap1 (185 feet)   HR_86____   02 Sat__95___ Lap2 (185 feet)   HR__104___   02 Sat__94___    Lap3 (185 feet)   HR__110___   02 Sat_94___  _x__Test Completed without Difficulty ___Test Stopped due to:  By: Michel Bickers CMA   CC: Pt here for follow-up. pt reports breathing is well.  Pt c/o productive cough with dark grey. Comments Medications reviewed with patient Carron Curie CMA  November 20, 2009 2:18 PM Daytime phone number verified with patient.    Physical Exam  General:  well developed, well nourished, in no acute distressthin and cachectic.  Improved weight Head:  normocephalic and atraumatic Eyes:  PERRLA/EOM intact; conjunctiva and sclera clear Ears:  TMs  intact and clear with normal canals Nose:  no deformity, discharge, inflammation, or lesions Mouth:  no deformity or lesions Neck:  no masses, thyromegaly, or abnormal cervical nodes Chest Wall:  no deformities noted Lungs:  decreased BS bilateral and prolonged exhilation.  some crackles on left Heart:  regular rate and rhythm, S1, S2 without murmurs, rubs, gallops, or clicks Abdomen:  soft normal bowel sounds peg tube + Msk:  no deformity or scoliosis noted with normal posture Pulses:  pulses normal Extremities:  no clubbing, cyanosis, edema, or deformity noted Neurologic:  CN II-XII grossly intact with normal reflexes, coordination, muscle strength and tone Skin:  intact without lesions or rashes Cervical Nodes:  no significant adenopathy Axillary Nodes:  no significant adenopathy Psych:  alert and cooperative; normal mood and affect; normal attention span and concentration   CT of Chest  Procedure date:  11/07/2009  Findings:       IMPRESSION:    1.  The lesion along the superior portion of the left major fissure   and extending into the superior segment left lower lobe does not   qualify as a hypermetabolic  malignancy based on PET CT.  Given that   this has significantly enlarged over the past 3 months, a low grade   malignancy seems unlikely.  I suspect that this may represent a   pseudotumor of fluid in the major fissure or an adjacent bulla, but   follow-up imaging is likely warranted to ensure resolution (chest   radiography would likely be satisfactory to follow).   2.  Mediastinal lymph nodes are only borderline increased in   metabolic activity.   3.  Severe emphysema.   4.  Scarring in the lower lobes.   5.  There is some frothy mucous in the right mainstem bronchus.   6.  Hypodense lesions in the liver and kidneys, likely cysts or   similar benign lesions given the lack of hypermetabolic activity.   7.  Contrast medium in the esophagus may reflect  gastroesophageal   reflux.   8.  Spondylosis.    Read By:  Dellia Cloud,  M.D.   Released By:  Dellia Cloud,  M.D.   Comments:      independently reviewed  Impression & Recommendations:  Problem # 1:  PULMONARY NODULE (ICD-518.89) Assessment New  AGree the LUL nodule is a pseudotumor. Came on too quick. Righjt in the fissure. PET negative.  plan repeat cxr in fewe months He wants that followup dne here  - I will inform Dr. Edwyna Shell  Orders: Est. Patient Level IV (24401)  Problem # 2:  HEMOPTYSIS UNSPECIFIED (ICD-786.30) Assessment: Improved resolved  plan no furhter followup The following medications were removed from the medication list:    Brovana 15 Mcg/14ml Nebu (Arformoterol tartrate) .Marland Kitchen... Twice daily His updated medication list for this problem includes:    Spiriva Handihaler 18 Mcg Caps (Tiotropium bromide monohydrate) ..... One inhalation daily  Problem # 3:  CHRONIC OBSTRUCTIVE PULMONARY DISEASE, MODERATE (ICD-496) Assessment: Unchanged stable disease. does not desaturare with exertion. refused rehab  plan continue spiriva  Problem # 4:  ABSCESS OF LUNG (ICD-513.0) Assessment: Improved  LLL necrotizing pna/ lung abscess s/p clinda and cleomycin Rx ending sept 2010.   >CT chest 12/6/201 showing significant improvement compared to CT July and August 2010 > CT shows Left main bronchus lesion that is new on 08/11/2009. This was not there before. This likely represents mucus plugging > CT 11/07/2009: LMB mucus has resolved. LLL PNA has cleared. New RMB muchs present. PET scan negative for nodes  plan no need for furhter CTs for lung abscess   Orders: Est. Patient Level IV (02725)  Problem # 5:  FAILURE TO THRIVE (ICD-783.41) Assessment: Improved  due to esoph obstruction. he is gainging weight  plan per Dr. Loreta Ave  Orders: Est. Patient Level IV [phone])  Problem # 6:  PULMONARY NODULE, RIGHT UPPER LOBE (ICD-518.89) Assessment: Comment  Only  RUL nodule on CT 08/11/2009 has nearly resolved compared to July and August 2010 CT  plan no further followup  Orders: Est. Patient Level IV (03474)  Patient Instructions: 1)  continue spiriva 2)  advise pulmonary rehab when you get interested in it 3)  have cxr in 2 months and see me back 4)  I wil inform Dr. Edwyna Shell that you wish to do al folowup here   Immunization History:  Pneumovax Immunization History:    Pneumovax:  pneumovax (11/04/2004)

## 2010-10-06 NOTE — Progress Notes (Signed)
Summary: talk to nurse  Phone Note Call from Patient Call back at Home Phone [phone]   Caller: Spouse//Lillian Call For: ramaswamy Reason for Call: Talk to Nurse Summary of Call: Wants to talk to nurse about scheduling her husband for pulmonary rehab program. Initial call taken by: Darletta Moll,  November 21, 2009 11:02 AM  Follow-up for Phone Call        Called, spoke with pt's wife Gardiner Ramus.  Per Gardiner Ramus, pt would like to start Pulm Rehab now.  Orders placed in EMR-Lillian aware.  Gweneth Dimitri RN  November 21, 2009 11:15 AM

## 2010-10-06 NOTE — Letter (Signed)
Summary: [facility]  [facility]   Imported By: Lester M[place]ridge 10/30/2009 07:28:29  _____________________________________________________________________  External Attachment:    Type:   Image     Comment:   External Document

## 2010-10-06 NOTE — Assessment & Plan Note (Signed)
Summary: rov   Visit Type:  Follow-up Copy to:  Dr Shana Chute Primary Provider/Referring Provider:  Dr. Shana Chute PMD, Dr Arty Baumgartner - GI  CC:  Pt here to discuss CT results . Marland Kitchen  History of Present Illness: #IOV 04/22/2009: Mr Lastinger is an ex-smoker,  77 year old male who was admitted 03/20/2009 - 04/16/2009   1. Deconditioning in the setting of left lower lobe necrotizing pneumonia; on clinda/ciprp.   2. Hypertension.   3. History of esophageal stricture with chronic dysphagia (chronic peg, and is strictly npo)  4. Chronic hiccups.   5. Tube feed-induced diarrhea.   6. Chronic atrial fibrillation.   7. Iron-deficiency anemia.   8. Mild hyponatremia.   Seen by pulm consult on 7/15. Thought this was  aspiration necrotizing pna/lung abscess. Prolonged abx course. CT scan 7/21 confromed a large LLL multiple density consolidation v  mass. Now presentting for followup. He feels better overall. There are no new complaints. He is ambulating at home but gets tired walkin across room or from one room to another. HE has some cough with some sputum that is white. He is following strict NPO advice and his getting nutrtion through chronic peg. He is compliant with clinda and cipro. REC: CT chest   #OV 06/24/2009. Foloowup COPD, LL aspiration pna.  After last visit, he had CT chest on 04/24/2009 which was 1 month out of his pneumonia. This was unchanged from admission CT in July 2010. Durng this time, wife started noticing clinical improvement. So, we decided to pursue an expectant approach. He has now finished his clinda and cipro course. Wife states he has not taken antibiotics in over 1 month. At home, PT helped him out till rcently. He has now gained strength and is independent with ADLs. Wife feels he no longer needs O2.He is able to walk to the car as well. No new issues although wife says Dr. Loreta Ave has advised him to eat but he will have hiccups with it.  REC: Followup CT and spirpoOV 08/11/2009: Followup for  Deconditioning,  LLL aspiration PNA, RUL nodule and COPD. Since last visit, improved conditioning. DOing all ADLs. Home PT not coming anymore. Refuses to do rehab. In terms of aspiration: still using PEG largely. DOes not feel like taking po and when he does has hiccups. Also, worried he might aspirate. Wife says Dr. Loreta Ave has cleared him to eat but is waiting on pneumonia clearance beofre doing esophageal dilatation. CT chest today shows clearance of  LLL PNA and RUL nodule compared to July and August 2010. IN terms of COPd, some chronic white mucus with cough but no dyspnea for ADLs. Using spiriva regularly. His pulse ox on room air at rest is fine but desaturated to 88% after walking 155 feet x 2 laps. Only NEW issue is progressive weight loss since august despite ct chest improvement. He is now 130# but was 5# heavier compared to august 2010.   Card[employer]Perfect Spirometry  ID: [id number] Patient: VELTON, ROSELLE DOB: [birth year] Age: 77 Years Old Sex: Male Race: Black Height: 69 Weight: 130 PPD: 0.75 Status: Unconfirmed Past Medical History:  #ADMISSION 03/20/2009 - 04/16/2009 (medicne and rehab) for  1. Deconditioning in the setting of left lower lobe necrotizing pneumonia; on clinda/ciprp.   2. Hypertension.   3. History of esophageal stricture with chronic dysphagia.   4. Chronic hiccups.   5. Tube feed-induced diarrhea.   6. Chronic atrial fibrillation.   7. Iron-deficiency anemia.   8. Mild hyponatremia.  Hypertension Atrial Fibrillation Mild Hyponatremia Anemia Pneumonia History of esophageal stricture and stenosis Chronic Hiccups Coronary Heart Disease C O P D Chronic Dysphagia with PEG tube placement Hiatal Hernia Myocardial Infarction-Jan 2009 Abdominal Aneurysm  Recorded: 08/11/2009 3:39 PM  Parameter  Measured Predicted %Predicted FVC     2.43        3.07        79 FEV1     1.16        2.11        55.30 FEV1%   47.98        69.89        68.70 PEF    2.39         5.90        40.50   Interpretation: gold stage 2 copd. personallh interpreted tacing  Current Medications (verified): 1)  Plavix 75 Mg Tabs (Clopidogrel Bisulfate) .... Take 1 Tablet By Mouth Once A Day 2)  Spiriva Handihaler 18 Mcg Caps (Tiotropium Bromide Monohydrate) .... One Inhalation Daily 3)  Baclofen 10 Mg Tabs (Baclofen) .... 5 Times A Day Before Meals 4)  Ferrous Sulfate 325 (65 Fe) Mg Tabs (Ferrous Sulfate) .... Take 1 Tablet By Mouth Two Times A Day 5)  Jevity  Liqd (Nutritional Supplements) .... At 7am, 11am, 3pm, 7pm and 10pm, Each Tube Flushes Past Tube Feeds. 6)  Nexium 40 Mg Cpdr (Esomeprazole Magnesium) .... Take 1 Tablet By Mouth Once A Day  Allergies (verified): No Known Drug Allergies  Past History:  Family History: Last updated: 04/22/2009 Pt has 10 siblings 1Sister-Breast cancer 1 Sister-cancer (unsure of what type) 3 Brothers-cancer(unsure of what type)  Social History: Last updated: 04/22/2009 Married Hx of ETOH abuse-quit 30 yrs ago Patient states former smoker x 30years. Quit in 2006  Risk Factors: Smoking Status: quit (04/22/2009) Packs/Day: 0.75 (04/22/2009)  Past Medical History: #ADMISSION 03/20/2009 - 04/16/2009 (medicne and rehab) for  1. Deconditioning in the setting of left lower lobe necrotizing pneumonia; on clinda/ciprp.   2. Hypertension.   3. History of esophageal stricture with chronic dysphagia.   4. Chronic hiccups.   5. Tube feed-induced diarrhea.   6. Chronic atrial fibrillation.   7. Iron-deficiency anemia.   8. Mild hyponatremia.    Hypertension Atrial Fibrillation Mild Hyponatremia Anemia Pneumonia History of esophageal stricture and stenosis Chronic Hiccups Coronary Heart Disease #C O P D > desaturated to 89% after 155 feet x 2 laps on 08/11/2009 >Gold stage 2 COPD on 08/11/2009 Chronic Dysphagia with PEG tube placement Hiatal Hernia Myocardial Infarction-Jan 2009 Abdominal Aneurysm  Past Surgical  History: Reviewed history from 04/22/2009 and no changes required. Right Eye Macular Hole Repair 2003 Stent placed x 2  Family History: Reviewed history from 04/22/2009 and no changes required. Pt has 10 siblings 1Sister-Breast cancer 1 Sister-cancer (unsure of what type) 3 Brothers-cancer(unsure of what type)  Social History: Reviewed history from 04/22/2009 and no changes required. Married Hx of ETOH abuse-quit 30 yrs ago Patient states former smoker x 30years. Quit in 2006  Review of Systems       The patient complains of loss of appetite, weight change, and difficulty swallowing.  The patient denies shortness of breath with activity, shortness of breath at rest, productive cough, non-productive cough, coughing up blood, chest pain, irregular heartbeats, acid heartburn, indigestion, abdominal pain, sore throat, tooth/dental problems, headaches, nasal congestion/difficulty breathing through nose, sneezing, itching, ear ache, anxiety, depression, hand/feet swelling, joint stiffness or pain, rash, change in color of mucus,  and fever.         3# weight loss isnce last visit  Vital Signs:  Patient profile:   77 year old male Height:      69 inches Weight:      130 pounds O2 Sat:      94 % on Room air Temp:     97.8 degrees F oral Pulse rate:   67 / minute BP sitting:   122 / 86  (right arm) Cuff size:   regular  Vitals Entered By: Carron Curie CMA (August 11, 2009 3:15 PM)  O2 Flow:  Room air CC: Pt here to discuss CT results .  Comments Medications reviewed with patient Carron Curie CMA  August 11, 2009 3:19 PM    Physical Exam  General:  well developed, well nourished, in no acute distressthin and cachectic.   Head:  normocephalic and atraumatic Eyes:  PERRLA/EOM intact; conjunctiva and sclera clear Ears:  TMs intact and clear with normal canals Nose:  no deformity, discharge, inflammation, or lesions Mouth:  no deformity or lesions Neck:  no masses,  thyromegaly, or abnormal cervical nodes Chest Wall:  no deformities noted Lungs:  decreased BS bilateral and prolonged exhilation.  some crackles on left Heart:  regular rate and rhythm, S1, S2 without murmurs, rubs, gallops, or clicks Abdomen:  soft normal bowel sounds peg tube + Msk:  no deformity or scoliosis noted with normal posture Pulses:  pulses normal Extremities:  no clubbing, cyanosis, edema, or deformity noted Neurologic:  CN II-XII grossly intact with normal reflexes, coordination, muscle strength and tone Skin:  intact without lesions or rashes Cervical Nodes:  no significant adenopathy Axillary Nodes:  no significant adenopathy Psych:  alert and cooperative; normal mood and affect; normal attention span and concentration   CT of Chest  Procedure date:  08/11/2009  Findings:      Likely mucus in LMB - new since July and August 2010 RUL nodules is cleared LLL Pna has improved significantly Significant COPD +    Comments:      personally reviewed  Pulmonary Function Test Date: 08/11/2009 3:39 PM Gender: Male  Pre-Spirometry FVC    Value: 2.43 L/min   % Pred: 79 % FEV1    Value: 1.16 L     Pred: 2.11 L     % Pred: 55.30 % FEV1/FVC  Value: 47.98 %     % Pred: 68.70 %  Impression & Recommendations:  Problem # 1:  ABSCESS OF LUNG (ICD-513.0) Assessment Improved  LLL necrotizing pna/ lung abscess s/p clinda and cleomycin Rx ending sept 2010.   >CT chest 12/6/201 showing significant improvement compared to CT July and August 2010 > CT shows Left main bronchus lesion that is new on 08/11/2009. This was not there before. This likely represents mucus plugging  plan will get ct chest in 3 months   Orders: Est. Patient Level IV (86578)  Problem # 2:  PULMONARY NODULE, RIGHT UPPER LOBE (ICD-518.89) Assessment: Improved  RUL nodule on CT 08/11/2009 has nearly resolved compared to July and August 2010 CT  plan no further followup  Orders: Est. Patient  Level IV (46962)  Problem # 3:  FAILURE TO THRIVE (ICD-783.41) Assessment: Deteriorated  continues to lose weight despite improvement in CT. Syspect this is nutritional due to peg dependent nutrition and inability/lack of desire to take by mouth  plan wife told to consult [facility]ist with [facility] I have updated Dr. Loreta Ave personally over phone  today - she plans repeat GI eval to see if dysphagia could be improved He should be okay for endoscopy from pulmonary perspective (Fev1 is >1L and normoxic on room air)  Orders: Est. Patient Level IV (16109)  Problem # 4:  OTHER DYSPHAGIA (ICD-787.29) Assessment: Comment Only  per failure to thrive  Orders: Est. Patient Level IV (60454)  Problem # 5:  CHRONIC OBSTRUCTIVE PULMONARY DISEASE, MODERATE (ICD-496) Assessment: Improved stable disease  plan offered rehab but he refused continue spiriva start nebs pulmicort and brovana updated flu and pneumovax status  Medications Added to Medication List This Visit: 1)  Pulmicort 0.5 Mg/31ml Susp (Budesonide (inhalation)) .... One respule into nebulizer twice daily. 2)  Brovana 15 Mcg/47ml Nebu (Arformoterol tartrate) .... One in nebulizer twice daily  Other Orders: DME Referral (DME)  Patient Instructions: 1)  continue spiriva 2)  start brovana nebs two times a day 3)  start pulmicort neb two times a day 4)  will get above set up through [facility] 5)  followup CT chest in 3 months  - if needed sooner will call you 6)  return in 3 months  7)  talk to [facility] and see if they can improve nutrition 8)  i will get hold of Dr Loreta Ave to see if she can do anything to improve your from swallowing side Prescriptions: BROVANA 15 MCG/2ML  NEBU (ARFORMOTEROL TARTRATE) One in nebulizer twice daily  #60 x 6   Entered and Authorized by:   Kalman Shan MD   Signed by:   Kalman Shan MD on 08/11/2009   Method used:   Electronically to        [facility]*  (retail)       [address]       [place], [place]  [id number]       Ph: [phone]       Fax: [phone]   RxID:   [id number] PULMICORT 0.5 MG/2ML  SUSP (BUDESONIDE (INHALATION)) One respule into nebulizer twice daily.  #60 x 6   Entered and Authorized by:   Kalman Shan MD   Signed by:   Kalman Shan MD on 08/11/2009   Method used:   Electronically to        [facility]* (retail)       [address]       [place], [place]  [id number]       Ph: [phone]       Fax: [phone]   RxID:   [id number]   Appended Document: rov Ambulatory Pulse Oximetry  Resting; HR__75___    02 Sat__95___room air  Lap1 (185 feet)   HR__106___   02 Sat__92___ Lap2 (185 feet)   HR__89___   02 Sat___89__    Lap3 (185 feet)   HR_____   02 Sat_____  ___Test Completed without Difficulty ___Test Stopped due to:

## 2010-10-06 NOTE — Assessment & Plan Note (Signed)
Summary: SOB ///kp   Visit Type:  IME Initial Copy to:  Dr Shana Chute Primary Provider/Referring Provider:  Dr. Shana Chute PMD, Dr Arty Baumgartner - GI  CC:  Pt is here for acute visit due to increased SOB. Pt also c/o reflux after tube feedings. .  History of Present Illness: OV 01/23/2010: 77 year old male. Followup for COPD - s/p rehab compleetion Aug 2011. 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 Background hx of LLL necrotixing pna and dysphagia/peg tube - Summer 2010 with clearane by March 2011.    June 09, 2010: Last visits March and May 2011. Was doing well. [facility] rehab August 2011.  Past 1 months increased reflux after PEG feeds. Also increased hiccups compared to baseline. Past 3 weeks increased dyspnea on exertion as well. Now dyspneic with shaving, walking across room. Dyspnea improved by rest.  Spiriva not helping, also ran out past week. Denies worsenign cough (although increased reflux and gag +), edema, syncope, nausea, vomit, diarrhea, sputum, hemoptysis, fever, chills. Howeer, when I went to rexamine him after cxr he had DEFINITE COUGH with BROWN SPUTUM (baseline is white).   No weight loss. Eating as before via PEG. Of note, as before he did not desaturate with exertion 185 feet x 3 laps.   Preventive Screening-Counseling & Management  Alcohol-Tobacco     Smoking Status: quit     Packs/Day: 34     Year Started: 1976     Year Quit: 2006     Pack years: 30  Current Medications (verified): 1)  Plavix 75 Mg Tabs (Clopidogrel Bisulfate) .... Take 1 Tablet By Mouth Once A Day 2)  Spiriva Handihaler 18 Mcg Caps (Tiotropium Bromide Monohydrate) .... One Inhalation Daily 3)  Baclofen 10 Mg Tabs (Baclofen) .... 5 Times A Day Before Meals 4)  Slow Fe 160 (50 Fe) Mg Cr-Tabs (Ferrous Sulfate Dried) .... Take 1 Tablet By Mouth Two Times A Day 5)  Jevity  Liqd (Nutritional Supplements) .... At 7am, 11am, 3pm, 7pm and 10pm, Each Tube Flushes Past Tube  Feeds. 6)  Nexium 40 Mg Cpdr (Esomeprazole Magnesium) .... Take 1 Tablet By Mouth Once A Day 7)  Tylenol With Codeine #3 300-30 Mg Tabs (Acetaminophen-Codeine) .... As Directed  Allergies (verified): No Known Drug Allergies  Past History:  Past medical, surgical, family and social histories (including risk factors) reviewed, and no changes noted (except as noted below).  Past Medical History: #ADMISSION 03/20/2009 - 04/16/2009 (medicne and rehab) for   -. Deconditioning in the setting of left lower lobe necrotizing pneumonia; on clinda/ciprp.    -  [facility] CTs show clearing Dec 2010 and November 04, 2009. No followup required after March 2011 #LUL NODULE - Pseudotumor  -CXR 10/14/2009 - incidenatl first appearance 3cm. PET negative 11/07/2009  - Improved on CXR /12/2009 2.6cm #RUL nodule  - first seen CT July 2010   - Resolved on CT 08/21/2009 #. Hypertension.   # History of esophageal stricture with chronic dysphagia. ..Marland KitchenMarland KitchenDr Loreta Ave   Lupe Carney stablisized becaues of peg feeds by March 2011 #. Chronic hiccups.  #. Tube feed-induced diarrhea.  #. Chronic atrial fibrillation.  #. Iron-deficiency anemia.    #Hypertension #Atrial Fibrillation #History of esophageal stricture and stenosis....Marland KitchenMarland KitchenDr Arty Baumgartner  - Complete obstruction on barium esopghagram Jan 2011  - On strict NPO and s/p PEG tube #Chronic Hiccups #Coronary Heart Disease #C O P D on spiriva...................Marland KitchenRamaswamy   - desaturated to 89% after 155 feet  x 2 laps on 08/11/2009   -Gold stage 2 COPD on 08/11/2009   - PFT at Dr. Edwyna Shell office after clearance of LLL PNA:  His FVC 2.66L,  FEV1 1.42L, DLCO 22%   - Did not desaturate with exertion March 17. 2011 and Jun 09, 2010  in pulm office #Hiatal Hernia #Myocardial Infarction-Jan 2009 #Abdominal Aneurysm  Past Surgical History: Reviewed history from 04/22/2009 and no changes required. Right Eye Macular Hole Repair 2003 Stent placed x 2  Family History: Reviewed history from  04/22/2009 and no changes required. Pt has 10 siblings 1Sister-Breast cancer 1 Sister-cancer (unsure of what type) 3 Brothers-cancer(unsure of what type)  Social History: Reviewed history from 04/22/2009 and no changes required. Married Hx of ETOH abuse-quit 30 yrs ago Patient states former smoker started n 63. Quit in 2006 avg 1 ppd Packs/Day:  13 Pack years:  30  Review of Systems       The patient complains of shortness of breath with activity, acid heartburn, and indigestion.  The patient denies shortness of breath at rest, productive cough, non-productive cough, coughing up blood, chest pain, irregular heartbeats, loss of appetite, weight change, abdominal pain, difficulty swallowing, sore throat, tooth/dental problems, headaches, nasal congestion/difficulty breathing through nose, sneezing, itching, ear ache, anxiety, depression, hand/feet swelling, joint stiffness or pain, rash, change in color of mucus, and fever.         has gained weight increased dyspnea and hiccups  Vital Signs:  Patient profile:   77 year old male Height:      69 inches Weight:      142 pounds BMI:     21.05 O2 Sat:      94 % on Room air Temp:     98.6 degrees F oral Pulse rate:   82 / minute BP sitting:   130 / 80  (right arm) Cuff size:   regular  Vitals Entered By: Carron Curie CMA (June 09, 2010 2:16 PM)  O2 Flow:  Room air  Serial Vital Signs/Assessments:  Comments: Ambulatory Pulse Oximetry  Resting; HR_78____    02 Sat_93%RA____  Lap1 (185 feet)   HR_94____   02 Sat_95%RA____ Lap2 (185 feet)   HR__98___   02 Sat__92%RA___    Lap3 (185 feet)   HR__103___   02 Sat__92%RA___  _X__Test Completed without Difficulty ___Test Stopped due to:  Carver Fila  June 09, 2010 3:11 PM    By: Carver Fila   CC: Pt is here for acute visit due to increased SOB. Pt also c/o reflux after tube feedings.  Comments Medications reviewed with patient Carron Curie CMA  June 09, 2010 2:21 PM Daytime phone number verified with patient.    Physical Exam  General:  well developed, well nourished, in no acute distress.   Improved weight. healthy appearing.   Head:  normocephalic and atraumatic Eyes:  PERRLA/EOM intact; conjunctiva and sclera clear Ears:  TMs intact and clear with normal canals Nose:  no deformity, discharge, inflammation, or lesions Mouth:  no deformity or lesions Neck:  no masses, thyromegaly, or abnormal cervical nodes Chest Wall:  no deformities noted Lungs:  decreased BS bilateral and prolonged exhilation.   Heart:  regular rate and rhythm, S1, S2 without murmurs, rubs, gallops, or clicks Abdomen:  soft normal bowel sounds peg tube + Msk:  no deformity or scoliosis noted with normal posture Pulses:  pulses normal Extremities:  no clubbing, cyanosis, edema, or deformity noted Neurologic:  CN II-XII grossly intact  with normal reflexes, coordination, muscle strength and tone Skin:  intact without lesions or rashes Cervical Nodes:  no significant adenopathy Axillary Nodes:  no significant adenopathy Psych:  alert and cooperative; normal mood and affect; normal attention span and concentration   CXR  Procedure date:  06/09/2010  Findings:      LUL nodule resolved Clear and unchanged from May 2011  Impression & Recommendations:  Problem # 1:  PULMONARY NODULE (ICD-518.89) Assessment Improved  Orders: T-2 View CXR (71020TC) Est. Patient Level IV (16109)  Orders: Est. Patient Level III (60454)   LUL nodule is a pseudotumor. PET negative March 2011. 01/07/2010 size has decreased. CXR 06/09/2010 shows resolution  plan no further followup  Problem # 2:  CHRONIC OBSTRUCTIVE PULMONARY DISEASE, MODERATE (ICD-496) Assessment: Deteriorated c/o worsening dyspnea compared to baesline. This  has started few weeks after stopping rehab. Although denied cough I saw him cough up brown sputum in office. CXR is negative fpr pneumonia and he did  not desaturate with exertion in office.  Suspect mild AECOPD and worsening GERD making things worse.     plan d/w Dr. Loreta Ave his GI doc on phone. She recommends doubling up on nexium and following with her continue spiriva - will refill have flu shot short course antibiotic Rx for mild AECOPD - to be given via PEG At fu check alpha 1 antitrypsin ROV 3 months    Spiriva Handihaler 18 Mcg Caps (Tiotropium bromide monohydrate) ..... One inhalation daily  Problem # 3:  SHORTNESS OF BREATH (SOB) (ICD-786.05) Assessment: Deteriorated  Medications Added to Medication List This Visit: 1)  Slow Fe 160 (50 Fe) Mg Cr-tabs (Ferrous sulfate dried) .... Take 1 tablet by mouth two times a day 2)  Nexium 40 Mg Cpdr (Esomeprazole magnesium) .... Take 1 tablet by peg tube two times a day 3)  Doxycycline Hyclate 100 Mg Tabs (Doxycycline hyclate) .Marland Kitchen.. 1 crushed tablet via peg tube two times a day after meals  Patient Instructions: 1)  #COPD 2)    - have flu shot 3)   - there is a small flare of cOpd 4)   - continue your spiriva 5)   - take antibiotic via PEG as directed 6)   - if not better or getting worse call us 24/7 or go to ER 7)    - check alpha 1 antityrpsin at folowup 8)  #INCREASED REFLUX 9)   - double up on your nexium - script sent 10)   - call dR. Mann and see her in few weeks 11)  #RETURN 12)   - 3 months or sooner if worse Prescriptions: DOXYCYCLINE HYCLATE 100 MG TABS (DOXYCYCLINE HYCLATE) 1 crushed tablet via peg tube two times a day after meals  #10 x 0   Entered and Authorized by:   Kalman Shan MD   Signed by:   Kalman Shan MD on 06/09/2010   Method used:   Electronically to        [facility] [address] [phone]* (retail)       [address]       [place], [place]  [id number]       Ph: [phone] or [phone]       Fax: [phone]   RxID:   [id number] NEXIUM 40 MG CPDR (ESOMEPRAZOLE MAGNESIUM) Take 1 tablet by peg tube two times a  day  #50 x 6   Entered and Authorized by:   Kalman Shan  MD   Signed by:   Kalman Shan MD on 06/09/2010   Method used:   [facility]        [facility] [address] [phone]* (retail)       [address]       [place], [place]  [address]       Ph: [phone] or [phone]       Fax: [phone]   RxID:   [id number]

## 2010-10-06 NOTE — Progress Notes (Signed)
Summary: oxygen question ----does not need o2  Phone Note Call from Patient   Caller: Patient Call For: ramaswamy Summary of Call: pt want to know if he can get order to start back on oxygen. Initial call taken by: Rickard Patience,  June 10, 2010 4:33 PM  Follow-up for Phone Call        Pt is asking if he can go on oxygen. He states that Dr. Loreta Ave rec this. I advised pt that when we did walk test he did not desat so it would be hard to get him qualified for oxygen. I adivsed I will send to MR to advise. Carron Curie CMA  June 10, 2010 4:56 PM   Additional Follow-up for Phone Call Additional follow up Details #1::        does not need o2. Did not desaturate MArch 2011 and OCt 2011. Reassure. IF he still wants to talk to me, pls let me know Additional Follow-up by: Kalman Shan MD,  June 15, 2010 7:17 PM    Additional Follow-up for Phone Call Additional follow up Details #2::    Called, spoke with pt's wife.  Left message for pt to return call.   Gweneth Dimitri RN  June 16, 2010 9:31 AM   called and spoke with pt.  informed him of MR's recs and that Ambulatory Oximetry test in march 2011 and oct 2011 didn't show desat. no need for o2.  pt verbalized his understanding and denied any questions.  nothing further needed. Arman Filter LPN  June 18, 2010 12:43 PM

## 2010-10-06 NOTE — Assessment & Plan Note (Signed)
Summary: 2 MONTHS/ MBW   Visit Type:  Follow-up Copy to:  Dr Shana Chute Primary Provider/Referring Provider:  Dr. Shana Chute PMD, Dr Arty Baumgartner - GI  CC:  Pt here for 2 month follow-up. Pt is attending rehab. Marland Kitchen  History of Present Illness: OV 01/23/2010: 77 year old male. Followup for COPD, LUL Nodule - pseudotumor, 2-3cm, PET negative March 2011. Background hx of LLL necrotixing pna and dysphagia/peg tube  Doing well since last visit March 2011. No new complaints. Has started pulmonary rehab past few weeks. So far liking it and helping. No weight loss. Dysphagia continues. No worsening dyspnea, cough, sputum. Eating as before via PEG. Had CXR 01/07/2010 for pseudotumor - size has decreased to 2.4cm or so  Current Medications (verified): 1)  Plavix 75 Mg Tabs (Clopidogrel Bisulfate) .... Take 1 Tablet By Mouth Once A Day 2)  Spiriva Handihaler 18 Mcg Caps (Tiotropium Bromide Monohydrate) .... One Inhalation Daily 3)  Baclofen 10 Mg Tabs (Baclofen) .... 5 Times A Day Before Meals 4)  Ferrous Sulfate 325 (65 Fe) Mg Tabs (Ferrous Sulfate) .... Take 1 Tablet By Mouth Two Times A Day 5)  Jevity  Liqd (Nutritional Supplements) .... At 7am, 11am, 3pm, 7pm and 10pm, Each Tube Flushes Past Tube Feeds. 6)  Nexium 40 Mg Cpdr (Esomeprazole Magnesium) .... Take 1 Tablet By Mouth Once A Day 7)  Tylenol With Codeine #3 300-30 Mg Tabs (Acetaminophen-Codeine) .... As Directed  Allergies (verified): No Known Drug Allergies  Past History:  Family History: Last updated: 04/22/2009 Pt has 10 siblings 1Sister-Breast cancer 1 Sister-cancer (unsure of what type) 3 Brothers-cancer(unsure of what type)  Social History: Last updated: 04/22/2009 Married Hx of ETOH abuse-quit 30 yrs ago Patient states former smoker x 30years. Quit in 2006  Risk Factors: Smoking Status: quit (04/22/2009) Packs/Day: 0.75 (04/22/2009)  Past Medical History: #ADMISSION 03/20/2009 - 04/16/2009 (medicne and rehab) for  1.  Deconditioning in the setting of left lower lobe necrotizing pneumonia; on clinda/ciprp.  > [facility] CTs show clearing Dec 2010 and November 04, 2009 > No further followup Cts after March 2011 #LUL NODULE - Pseudotumor >CXR 10/14/2009 - incidenatl first appearance 3cm. PET negative 11/07/2009 > Improved on CXR /12/2009 2.6cm #RUL nodule > first seen CT July 2010 > Resolved on CT 08/21/2009 #. Hypertension.   # History of esophageal stricture with chronic dysphagia. ..Marland KitchenMarland KitchenDr Loreta Ave > Wegith stablisized becaues of peg feeds by March 2011 #. Chronic hiccups.  #. Tube feed-induced diarrhea.  #. Chronic atrial fibrillation.   7. Iron-deficiency anemia.    #Hypertension #Atrial Fibrillation #History of esophageal stricture and stenosis....Marland KitchenMarland KitchenDr Arty Baumgartner > Complete obstruction on barium esopghagram Jan 2011 > On strict NPO and s/p PEG tube #Chronic Hiccups #Coronary Heart Disease #C O P D on spiriva...................Marland KitchenRamaswamy > desaturated to 89% after 155 feet x 2 laps on 08/11/2009 >Gold stage 2 COPD on 08/11/2009 > PFT at Dr. Edwyna Shell office after clearance of LLL PNA:  His FVC was 2.66   with an FEV1 of 1.42 with a diffusion capacity of 22%.  There were   multiple bullae on the CT scan.  > Did not desaturate with exertion March 17. 2011 in pulm office #Hiatal Hernia #Myocardial Infarction-Jan 2009 #Abdominal Aneurysm  Past Surgical History: Reviewed history from 04/22/2009 and no changes required. Right Eye Macular Hole Repair 2003 Stent placed x 2  Family History: Reviewed history from 04/22/2009 and no changes required. Pt has 10 siblings 1Sister-Breast cancer 1 Sister-cancer (unsure of what type)  3 Brothers-cancer(unsure of what type)  Social History: Reviewed history from 04/22/2009 and no changes required. Married Hx of ETOH abuse-quit 30 yrs ago Patient states former smoker x 30years. Quit in 2006  Review of Systems  The patient denies shortness of breath with activity, shortness  of breath at rest, productive cough, non-productive cough, coughing up blood, chest pain, irregular heartbeats, acid heartburn, indigestion, loss of appetite, weight change, abdominal pain, difficulty swallowing, sore throat, tooth/dental problems, headaches, nasal congestion/difficulty breathing through nose, sneezing, itching, ear ache, anxiety, depression, hand/feet swelling, joint stiffness or pain, rash, change in color of mucus, and fever.    Vital Signs:  Patient profile:   77 year old male Height:      69 inches Weight:      138 pounds BMI:     20.45 O2 Sat:      94 % on Room air Temp:     98.3 degrees F oral Pulse rate:   83 / minute BP sitting:   130 / 68  (right arm) Cuff size:   regular  Vitals Entered By: Carron Curie CMA (Jan 23, 2010 1:55 PM)  O2 Flow:  Room air CC: Pt here for 2 month follow-up. Pt is attending rehab.  Comments Medications reviewed with patient Carron Curie CMA  Jan 23, 2010 1:55 PM Daytime phone number verified with patient.    Physical Exam  General:  well developed, well nourished, in no acute distress.   Improved weighthealthy appearing.   Head:  normocephalic and atraumatic Eyes:  PERRLA/EOM intact; conjunctiva and sclera clear Ears:  TMs intact and clear with normal canals Nose:  no deformity, discharge, inflammation, or lesions Mouth:  no deformity or lesions Neck:  no masses, thyromegaly, or abnormal cervical nodes Chest Wall:  no deformities noted Lungs:  decreased BS bilateral and prolonged exhilation.  some crackles on left Heart:  regular rate and rhythm, S1, S2 without murmurs, rubs, gallops, or clicks Abdomen:  soft normal bowel sounds peg tube + Msk:  no deformity or scoliosis noted with normal posture Pulses:  pulses normal Extremities:  no clubbing, cyanosis, edema, or deformity noted Neurologic:  CN II-XII grossly intact with normal reflexes, coordination, muscle strength and tone Skin:  intact without lesions or  rashes Cervical Nodes:  no significant adenopathy Axillary Nodes:  no significant adenopathy Psych:  alert and cooperative; normal mood and affect; normal attention span and concentration   CXR  Procedure date:  01/07/2010  Findings:       Had CXR 01/07/2010 for pseudotumor - size has decreased to 2.4cm or so  Comments:      independently reviewed  Impression & Recommendations:  Problem # 1:  PULMONARY NODULE (ICD-518.89) Assessment Improved  Orders: Est. Patient Level III (19147)   LUL nodule is a pseudotumor. PET negative March 2011. 01/07/2010 size has decreased  plan repeat cxr 6 months  Problem # 2:  CHRONIC OBSTRUCTIVE PULMONARY DISEASE, MODERATE (ICD-496) Assessment: Unchanged I am glad he is attending rehab.  plan continue spiriva  continue rehab rov 6 months  Medications Added to Medication List This Visit: 1)  Proair Hfa 108 (90 Base) Mcg/act Aers (Albuterol sulfate) .Marland Kitchen.. 1-2 puffs every 4-6 hours as needed  Other Orders: Prescription Created Electronically [id number]) HFA Instruction [phone])  Patient Instructions: 1)  return to see me in 6 months (not 9 months as I said) 2)  please continue spiriva 3)  continue rehab 4)  at followoup I might do cxr 5)  have  flu shot in fall 6)  come sooner or call if there are problems 7)  i am givign you  a pro-air sample - use this when needed  Prescriptions: PROAIR HFA 108 (90 BASE) MCG/ACT  AERS (ALBUTEROL SULFATE) 1-2 puffs every 4-6 hours as needed  #1 x 6   Entered and Authorized by:   Kalman Shan MD   Signed by:   Kalman Shan MD on 01/23/2010   Method used:   [facility]  [address] [phone]* (retail)       [address]       [place], [place]  [address]       Ph: [phone] or [phone]       Fax: [phone]   RxID:   [id number] SPIRIVA HANDIHALER 18 MCG CAPS (TIOTROPIUM BROMIDE MONOHYDRATE) one inhalation daily  #1 x 6   Entered and  Authorized by:   Kalman Shan MD   Signed by:   Kalman Shan MD on 01/23/2010   Method used:   [facility]        [facility]  [address] [phone]* (retail)       [address]       [place], [place]  [id number]       Ph: [phone] or [phone]       Fax: [phone]   RxID:   [id number]

## 2010-10-06 NOTE — Progress Notes (Signed)
Summary: Brovana  Phone Note From Pharmacy Call back at [phone]   [facility]  [address] [phone]* Call For: Camar [place]  Summary of Call: called to say pt's insurance is requiring Prior Auth on Brovana or pt needs to get this thru [facility] or DME. Initial call taken by: Eugene Gavia,  October 06, 2009 4:22 PM  Follow-up for Phone Call        called and spoke with pts wife---she stated that somehow this keeps getting messed up---[facility] bro[facility] and does not use [facility] [address]--she does not need any of this med---she will call back when she needs a refill Randell Loop CMA  October 06, 2009 4:58 PM

## 2010-10-06 NOTE — Letter (Signed)
Summary: [facility]  [facility]   Imported By: Lester [place] 02/09/2010 08:15:27  _____________________________________________________________________  External Attachment:    Type:   Image     Comment:   External Document

## 2010-10-06 NOTE — Progress Notes (Signed)
Summary: FYI cancelled Ct  Phone Note From Other Clinic Call back at [phone]   Caller: rose w/ [facility] Call For: [facility] Summary of Call: PT has cancelled CT scan for and Rose just wanted you to know. Initial call taken by: Tivis Ringer, CNA,  November 13, 2009 5:33 PM  Follow-up for Phone Call        why? Is he going to fu with me? Follow-up by: Kalman Shan MD,  November 16, 2009 10:33 PM  Additional Follow-up for Phone Call Additional follow up Details #1::        pt cancelled appt becuase he had a ct done last week that was ordered by Dr. Remo Lipps office. I have requested report be faxed. Pt has f/u appt on 11/20/09.Carron Curie CMA  November 17, 2009 11:23 AM

## 2010-10-06 NOTE — Assessment & Plan Note (Signed)
Summary: SOB///kp   Visit Type:  Follow-up Copy to:  Dr Shana Chute Primary Provider/Referring Provider:  Dr. Shana Chute PMD, Dr Arty Baumgartner - GI  CC:  Pt here for acute visit for increased SOB with exertion and rest x 2 weeks. Pt has not used spiriva in one month..  History of Present Illness: OV 10/06/2009. Followup LLL aspiration-necrotizing PNA (from August 2010 due to esophageal stricture and is s/p peg), Deconditioning (refused opd rehab), RUL nodule, COPD and weight loss. Since last visit on 08/14/2009  has gained weight but ran out of spiriva 1 month ago. Also is not taking pulmicort nebs. Wife says medicines are all confusing. For last 2 weeks now worsening dyspnea on exertion, and change in sputum to grey mucus. He has been evaluated with Ba swallow on 08/14/2009 and this showed eso. obstruction. Wife says he has been scoped since by Dr. Loreta Ave and failed to open esophagus. Therefore, still using PEG. Denies fever, edema. Of note, he is reporting some new streaky hemoptysis x 2 months and says this is now nearly resolved (very poor historian and he had denied this to my cma earlier today and even to [place] before)  Current Medications (verified): 1)  Plavix 75 Mg Tabs (Clopidogrel Bisulfate) .... Take 1 Tablet By Mouth Once A Day 2)  Spiriva Handihaler 18 Mcg Caps (Tiotropium Bromide Monohydrate) .... One Inhalation Daily 3)  Baclofen 10 Mg Tabs (Baclofen) .... 5 Times A Day Before Meals 4)  Ferrous Sulfate 325 (65 Fe) Mg Tabs (Ferrous Sulfate) .... Take 1 Tablet By Mouth Two Times A Day 5)  Jevity  Liqd (Nutritional Supplements) .... At 7am, 11am, 3pm, 7pm and 10pm, Each Tube Flushes Past Tube Feeds. 6)  Nexium 40 Mg Cpdr (Esomeprazole Magnesium) .... Take 1 Tablet By Mouth Once A Day 7)  Brovana 15 Mcg/49ml Nebu (Arformoterol Tartrate) .... Twice Daily 8)  Tylenol With Codeine #3 300-30 Mg Tabs (Acetaminophen-Codeine) .... As Directed  Allergies (verified): No Known Drug Allergies  Past  History:  Family History: Last updated: 04/22/2009 Pt has 10 siblings 1Sister-Breast cancer 1 Sister-cancer (unsure of what type) 3 Brothers-cancer(unsure of what type)  Social History: Last updated: 04/22/2009 Married Hx of ETOH abuse-quit 30 yrs ago Patient states former smoker x 30years. Quit in 2006  Risk Factors: Smoking Status: quit (04/22/2009) Packs/Day: 0.75 (04/22/2009)  Past Medical History: Reviewed history from 08/11/2009 and no changes required. #ADMISSION 03/20/2009 - 04/16/2009 (medicne and rehab) for  1. Deconditioning in the setting of left lower lobe necrotizing pneumonia; on clinda/ciprp.   2. Hypertension.   3. History of esophageal stricture with chronic dysphagia.   4. Chronic hiccups.   5. Tube feed-induced diarrhea.   6. Chronic atrial fibrillation.   7. Iron-deficiency anemia.   8. Mild hyponatremia.    Hypertension Atrial Fibrillation Mild Hyponatremia Anemia Pneumonia History of esophageal stricture and stenosis Chronic Hiccups Coronary Heart Disease #C O P D > desaturated to 89% after 155 feet x 2 laps on 08/11/2009 >Gold stage 2 COPD on 08/11/2009 Chronic Dysphagia with PEG tube placement Hiatal Hernia Myocardial Infarction-Jan 2009 Abdominal Aneurysm  Past Surgical History: Reviewed history from 04/22/2009 and no changes required. Right Eye Macular Hole Repair 2003 Stent placed x 2  Family History: Reviewed history from 04/22/2009 and no changes required. Pt has 10 siblings 1Sister-Breast cancer 1 Sister-cancer (unsure of what type) 3 Brothers-cancer(unsure of what type)  Social History: Reviewed history from 04/22/2009 and no changes required. Married Hx of ETOH abuse-quit 30  yrs ago Patient states former smoker x 30years. Quit in 2006  Review of Systems       The patient complains of shortness of breath with activity, shortness of breath at rest, and productive cough.  The patient denies non-productive cough, coughing up  blood, chest pain, irregular heartbeats, acid heartburn, indigestion, loss of appetite, weight change, abdominal pain, difficulty swallowing, sore throat, tooth/dental problems, headaches, nasal congestion/difficulty breathing through nose, sneezing, itching, ear ache, anxiety, depression, hand/feet swelling, joint stiffness or pain, rash, change in color of mucus, and fever.    Vital Signs:  Patient profile:   77 year old male Height:      69 inches Weight:      133.50 pounds O2 Sat:      92 % on Room air Temp:     98.5 degrees F oral Pulse rate:   94 / minute BP sitting:   120 / 80  (right arm) Cuff size:   regular  Vitals Entered By: Carron Curie CMA (October 06, 2009 2:42 PM)  O2 Flow:  Room air CC: Pt here for acute visit for increased SOB with exertion and rest x 2 weeks. Pt has not used spiriva in one month. Comments Medications reviewed with patient Carron Curie CMA  October 06, 2009 2:47 PM Daytime phone number verified with patient.    Physical Exam  General:  well developed, well nourished, in no acute distressthin and cachectic.   Head:  normocephalic and atraumatic Eyes:  PERRLA/EOM intact; conjunctiva and sclera clear Ears:  TMs intact and clear with normal canals Nose:  no deformity, discharge, inflammation, or lesions Mouth:  no deformity or lesions Neck:  no masses, thyromegaly, or abnormal cervical nodes Chest Wall:  no deformities noted Lungs:  decreased BS bilateral and prolonged exhilation.  some crackles on left Heart:  regular rate and rhythm, S1, S2 without murmurs, rubs, gallops, or clicks Abdomen:  soft normal bowel sounds peg tube + Msk:  no deformity or scoliosis noted with normal posture Pulses:  pulses normal Extremities:  no clubbing, cyanosis, edema, or deformity noted Neurologic:  CN II-XII grossly intact with normal reflexes, coordination, muscle strength and tone Skin:  intact without lesions or rashes Cervical Nodes:  no  significant adenopathy Axillary Nodes:  no significant adenopathy Psych:  alert and cooperative; normal mood and affect; normal attention span and concentration   Impression & Recommendations:  Problem # 1:  CHRONIC OBSTRUCTIVE PULMONARY DISEASE, MODERATE (ICD-496) Assessment Deteriorated Symptoms worse since last visit  compared to baseline. Suspect combination of medicine non compliance (poor udnerstanding) and mild infection  PLAN restart spiriva restart pulmicort nebs continue brovana nebs try doxycycline x 5 days rov 2 weeks with NP for med calendar and respinse  Problem # 2:  OTHER DYSPHAGIA (EAV-409.81) Assessment: Comment Only  per failure to thrive  Orders: Est. Patient Level IV (19147)  Problem # 3:  FAILURE TO THRIVE (ICD-783.41) Assessment: Improved  this is due to esophageal obstruction.  plan per Dr. Loreta Ave Weight appears to be stabilizing encourage peg feeds  Orders: Est. Patient Level IV (82956)  Problem # 4:  PULMONARY NODULE, RIGHT UPPER LOBE (ICD-518.89) Assessment: Comment Only  RUL nodule on CT 08/11/2009 has nearly resolved compared to July and August 2010 CT  plan no further followup  Orders: Radiology Referral (Radiology)  Problem # 5:  ABSCESS OF LUNG (ICD-513.0) Assessment: Unchanged  LLL necrotizing pna/ lung abscess s/p clinda and cleomycin Rx ending sept 2010.   >CT chest  12/6/201 showing significant improvement compared to CT July and August 2010 > CT shows Left main bronchus lesion that is new on 08/11/2009. This was not there before. This likely represents mucus plugging  plan will get ct chest early ,march 2011  Orders: Radiology Referral (Radiology) Est. Patient Level IV (53664)  Problem # 6:  HEMOPTYSIS UNSPECIFIED (ICD-786.30) Assessment: New  NEw scanty hemoptysis x 2 months. Nearly resolved  plan If this persists at follow up in 2 weeks with Tammy PArrett, then I need to set up for bronch otherwise CT early  march 2011 His updated medication list for this problem includes:    Spiriva Handihaler 18 Mcg Caps (Tiotropium bromide monohydrate) ..... One inhalation daily    Brovana 15 Mcg/42ml Nebu (Arformoterol tartrate) .Marland Kitchen... Twice daily    Pulmicort 0.5 Mg/55ml Susp (Budesonide (inhalation)) ..... One respule into nebulizer twice dail at 0.25mg /ml strenght.    Doxycycline Monohydrate 100 Mg Caps (Doxycycline monohydrate) ..... By peg tube two times a day after meals  Orders: Est. Patient Level IV (40347)  Medications Added to Medication List This Visit: 1)  Brovana 15 Mcg/70ml Nebu (Arformoterol tartrate) .... Twice daily 2)  Tylenol With Codeine #3 300-30 Mg Tabs (Acetaminophen-codeine) .... As directed 3)  Pulmicort 0.5 Mg/57ml Susp (Budesonide (inhalation)) .... One respule into nebulizer twice dail at 0.25mg /ml strenght. 4)  Doxycycline Monohydrate 100 Mg Caps (Doxycycline monohydrate) .... By peg tube two times a day after meals  Patient Instructions: 1)  please take spiriva 1 puff daily 2)  please take brovana 1 neb two times a day 3)  please take pulmicort 1 neb two times a day 4)  please take doxycycline 100mg  by peg tube two times a day after meals x 5 days 5)  return in 2 weeks to see my nurse Tammy 6)  You will see Tammy for med calendar  - so bring all your medicines 7)  You will see Tammy for followup of your lung disease 8)  If you are stil not better in 2 weeks, we will image your chest 9)  If you coughd up blood, let us know 10)  ct scan early march 2011 Prescriptions: DOXYCYCLINE MONOHYDRATE 100 MG  CAPS (DOXYCYCLINE MONOHYDRATE) By peg tube two times a day after meals  #10 x 0   Entered and Authorized by:   Kalman Shan MD   Signed by:   Kalman Shan MD on 10/06/2009   Method used:   [facility]  [address] [phone]* (retail)       [address]       [place], [place]  [address]       Ph: [phone] or [phone]        Fax: [phone]   RxID:   [id number] PULMICORT 0.5 MG/2ML  SUSP (BUDESONIDE (INHALATION)) One respule into nebulizer twice dail at 0.25mg /ml strenght.  #60 x 6   Entered and Authorized by:   Kalman Shan MD   Signed by:   Kalman Shan MD on 10/06/2009   Method used:   [facility]ically to        [facility]  [address] [phone]* (retail)       [address]       [place], [place]  [address]       Ph: [phone] or [phone]       Fax:  [phone]   RxID:   [id number] BROVANA 15 MCG/2ML NEBU (ARFORMOTEROL TARTRATE) twice daily  #60 x 6   Entered and Authorized by:   Kalman Shan MD   Signed by:   Kalman Shan MD on 10/06/2009   Method used:   [facility]        [facility]  [address] [phone]* (retail)       [address]       [place], [place]  [address]       Ph: [phone] or [phone]       Fax: [phone]   RxID:   [id number] SPIRIVA HANDIHALER 18 MCG CAPS (TIOTROPIUM BROMIDE MONOHYDRATE) one inhalation daily  #1 x 6   Entered and Authorized by:   Kalman Shan MD   Signed by:   Kalman Shan MD on 10/06/2009   Method used:   [facility]ically to        [facility]  [address] [phone]* (retail)       [address]       [place], [place]  [id number]       Ph: [phone] or [phone]       Fax: [phone]   RxID:   [id number]

## 2010-11-29 LAB — GLUCOSE, CAPILLARY: Glucose-Capillary: 93 mg/dL (ref 70–99)

## 2010-12-10 ENCOUNTER — Telehealth: Payer: Self-pay | Admitting: *Deleted

## 2010-12-10 NOTE — Telephone Encounter (Signed)
Summary of Call: supposed to have seen him 2 months ago. pls call and find out Initial call taken by: Kalman Shan MD,  November 23, 2010 11:49 PM  Spoke to pt and appt set for 01-08-11 at 10:30am.[name] [place], CMA

## 2010-12-12 LAB — GLUCOSE, CAPILLARY: Glucose-Capillary: 155 mg/dL — ABNORMAL HIGH (ref 70–99)

## 2010-12-12 LAB — CLOSTRIDIUM DIFFICILE EIA

## 2010-12-12 LAB — URINE CULTURE: Colony Count: 10000

## 2010-12-12 LAB — COMPREHENSIVE METABOLIC PANEL
ALT: 20 U/L (ref 0–53)
Calcium: 8.5 mg/dL (ref 8.4–10.5)
GFR calc Af Amer: >60 mL/min (ref >60)
Glucose, Bld: 91 mg/dL (ref 70–99)
Sodium: 134 mEq/L — ABNORMAL LOW (ref 135–145)
Total Protein: 6.2 g/dL (ref 6.0–8.3)

## 2010-12-12 LAB — CBC
Hemoglobin: 10.8 g/dL — ABNORMAL LOW (ref 13.0–17.0)
Hemoglobin: 12 g/dL — ABNORMAL LOW (ref 13.0–17.0)
MCHC: 33.1 g/dL (ref 30.0–36.0)
RBC: 3.88 MIL/uL — ABNORMAL LOW (ref 4.22–5.81)
RDW: 17 % — ABNORMAL HIGH (ref 11.5–15.5)

## 2010-12-12 LAB — URINALYSIS, ROUTINE W REFLEX MICROSCOPIC
Bilirubin Urine: NEGATIVE
Ketones, ur: NEGATIVE mg/dL
Nitrite: NEGATIVE
Protein, ur: NEGATIVE mg/dL
Urobilinogen, UA: 0.2 mg/dL (ref 0.0–1.0)
pH: 7.5 (ref 5.0–8.0)

## 2010-12-12 LAB — DIFFERENTIAL
Eosinophils Relative: 4 % (ref 0–5)
Lymphocytes Relative: 9 % — ABNORMAL LOW (ref 12–46)
Lymphs Abs: 0.5 10*3/uL — ABNORMAL LOW (ref 0.7–4.0)
Monocytes Relative: 10 % (ref 3–12)

## 2010-12-12 LAB — BASIC METABOLIC PANEL
Calcium: 9.2 mg/dL (ref 8.4–10.5)
GFR calc Af Amer: >60 mL/min (ref >60)
GFR calc non Af Amer: >60 mL/min (ref >60)
Potassium: 3.8 mEq/L (ref 3.5–5.1)
Sodium: 136 mEq/L (ref 135–145)

## 2010-12-13 LAB — CARDIAC PANEL(CRET KIN+CKTOT+MB+TROPI)
CK, MB: 2.9 ng/mL (ref 0.3–4.0)
Relative Index: INVALID (ref 0.0–2.5)
Relative Index: INVALID (ref 0.0–2.5)
Total CK: 26 U/L (ref 7–232)
Troponin I: 0.09 ng/mL — ABNORMAL HIGH (ref 0.00–0.06)

## 2010-12-13 LAB — BASIC METABOLIC PANEL
BUN: 7 mg/dL (ref 6–23)
CO2: 23 mEq/L (ref 19–32)
CO2: 23 mEq/L (ref 19–32)
CO2: 24 mEq/L (ref 19–32)
Calcium: 8.5 mg/dL (ref 8.4–10.5)
Calcium: 8.6 mg/dL (ref 8.4–10.5)
Calcium: 9 mg/dL (ref 8.4–10.5)
Chloride: 105 mEq/L (ref 96–112)
Chloride: 108 mEq/L (ref 96–112)
Chloride: 108 mEq/L (ref 96–112)
Creatinine, Ser: 1.25 mg/dL (ref 0.4–1.5)
GFR calc Af Amer: >60 mL/min (ref >60)
GFR calc Af Amer: >60 mL/min (ref >60)
GFR calc non Af Amer: >60 mL/min (ref >60)
GFR calc non Af Amer: >60 mL/min (ref >60)
GFR calc non Af Amer: >60 mL/min (ref >60)
GFR calc non Af Amer: >60 mL/min (ref >60)
Glucose, Bld: 67 mg/dL — ABNORMAL LOW (ref 70–99)
Glucose, Bld: 73 mg/dL (ref 70–99)
Glucose, Bld: 98 mg/dL (ref 70–99)
Potassium: 3.7 mEq/L (ref 3.5–5.1)
Potassium: 3.7 mEq/L (ref 3.5–5.1)
Potassium: 4.1 mEq/L (ref 3.5–5.1)
Potassium: 4.4 mEq/L (ref 3.5–5.1)
Sodium: 133 mEq/L — ABNORMAL LOW (ref 135–145)
Sodium: 140 mEq/L (ref 135–145)
Sodium: 140 mEq/L (ref 135–145)
Sodium: 141 mEq/L (ref 135–145)

## 2010-12-13 LAB — DIFFERENTIAL
Basophils Absolute: 0 10*3/uL (ref 0.0–0.1)
Eosinophils Absolute: 0 10*3/uL (ref 0.0–0.7)
Lymphs Abs: 0.7 10*3/uL (ref 0.7–4.0)
Monocytes Absolute: 1.2 10*3/uL — ABNORMAL HIGH (ref 0.1–1.0)
Monocytes Relative: 5 % (ref 3–12)
Neutro Abs: 21.4 10*3/uL — ABNORMAL HIGH (ref 1.7–7.7)

## 2010-12-13 LAB — MAGNESIUM
Magnesium: 1.9 mg/dL (ref 1.5–2.5)
Magnesium: 2.2 mg/dL (ref 1.5–2.5)

## 2010-12-13 LAB — CBC
HCT: 33.1 % — ABNORMAL LOW (ref 39.0–52.0)
HCT: 33.6 % — ABNORMAL LOW (ref 39.0–52.0)
HCT: 35.1 % — ABNORMAL LOW (ref 39.0–52.0)
HCT: 35.4 % — ABNORMAL LOW (ref 39.0–52.0)
HCT: 42.4 % (ref 39.0–52.0)
Hemoglobin: 10.9 g/dL — ABNORMAL LOW (ref 13.0–17.0)
Hemoglobin: 11 g/dL — ABNORMAL LOW (ref 13.0–17.0)
Hemoglobin: 11.2 g/dL — ABNORMAL LOW (ref 13.0–17.0)
Hemoglobin: 11.6 g/dL — ABNORMAL LOW (ref 13.0–17.0)
Hemoglobin: 11.7 g/dL — ABNORMAL LOW (ref 13.0–17.0)
Hemoglobin: 11.8 g/dL — ABNORMAL LOW (ref 13.0–17.0)
Hemoglobin: 14.3 g/dL (ref 13.0–17.0)
MCHC: 33.3 g/dL (ref 30.0–36.0)
MCHC: 34.5 g/dL (ref 30.0–36.0)
MCHC: 33.6 g/dL (ref 30.0–36.0)
MCV: 92.9 fL (ref 78.0–100.0)
MCV: 94.1 fL (ref 78.0–100.0)
MCV: 95.4 fL (ref 78.0–100.0)
MCV: 94.9 fL (ref 78.0–100.0)
Platelets: 324 10*3/uL (ref 150–400)
RBC: 3.51 MIL/uL — ABNORMAL LOW (ref 4.22–5.81)
RBC: 3.56 MIL/uL — ABNORMAL LOW (ref 4.22–5.81)
RBC: 3.67 MIL/uL — ABNORMAL LOW (ref 4.22–5.81)
RDW: 15.9 % — ABNORMAL HIGH (ref 11.5–15.5)
RDW: 16.3 % — ABNORMAL HIGH (ref 11.5–15.5)
RDW: 16.4 % — ABNORMAL HIGH (ref 11.5–15.5)
RDW: 16 % — ABNORMAL HIGH (ref 11.5–15.5)
WBC: 11.7 10*3/uL — ABNORMAL HIGH (ref 4.0–10.5)
WBC: 11.8 10*3/uL — ABNORMAL HIGH (ref 4.0–10.5)
WBC: 14 10*3/uL — ABNORMAL HIGH (ref 4.0–10.5)
WBC: 5.6 10*3/uL (ref 4.0–10.5)

## 2010-12-13 LAB — URINALYSIS, ROUTINE W REFLEX MICROSCOPIC
Ketones, ur: 15 mg/dL — AB
Nitrite: POSITIVE — AB
Protein, ur: 30 mg/dL — AB
Urobilinogen, UA: 4 mg/dL — ABNORMAL HIGH (ref 0.0–1.0)

## 2010-12-13 LAB — EXPECTORATED SPUTUM ASSESSMENT W GRAM STAIN, RFLX TO RESP C

## 2010-12-13 LAB — POCT I-STAT, CHEM 8
BUN: 27 mg/dL — ABNORMAL HIGH (ref 6–23)
Calcium, Ion: 1.2 mmol/L (ref 1.12–1.32)
Hemoglobin: 15.3 g/dL (ref 13.0–17.0)
TCO2: 21 mmol/L (ref 0–100)

## 2010-12-13 LAB — URINE MICROSCOPIC-ADD ON

## 2010-12-13 LAB — BLOOD GAS, ARTERIAL
Acid-Base Excess: 1.2 mmol/L (ref 0.0–2.0)
O2 Saturation: 95.1 %
TCO2: 25.4 mmol/L (ref 0–100)
pCO2 arterial: 32.6 mmHg — ABNORMAL LOW (ref 35.0–45.0)

## 2010-12-13 LAB — CULTURE, RESPIRATORY W GRAM STAIN

## 2010-12-13 LAB — COMPREHENSIVE METABOLIC PANEL
ALT: 28 U/L (ref 0–53)
ALT: 14 U/L (ref 0–53)
AST: 28 U/L (ref 0–37)
CO2: 30 mEq/L (ref 19–32)
Calcium: 8.3 mg/dL — ABNORMAL LOW (ref 8.4–10.5)
Calcium: 8.4 mg/dL (ref 8.4–10.5)
Creatinine, Ser: 1.1 mg/dL (ref 0.4–1.5)
GFR calc Af Amer: >60 mL/min (ref >60)
GFR calc Af Amer: 54 mL/min — ABNORMAL LOW (ref >60)
GFR calc non Af Amer: >60 mL/min (ref >60)
Glucose, Bld: 80 mg/dL (ref 70–99)
Potassium: 4.2 mEq/L (ref 3.5–5.1)
Sodium: 139 mEq/L (ref 135–145)
Sodium: 139 mEq/L (ref 135–145)
Total Protein: 5.9 g/dL — ABNORMAL LOW (ref 6.0–8.3)
Total Protein: 5.6 g/dL — ABNORMAL LOW (ref 6.0–8.3)

## 2010-12-13 LAB — CK TOTAL AND CKMB (NOT AT ARMC)
Relative Index: INVALID (ref 0.0–2.5)
Total CK: 26 U/L (ref 7–232)

## 2010-12-13 LAB — CULTURE, BLOOD (ROUTINE X 2)

## 2010-12-13 LAB — URINE CULTURE
Colony Count: NO GROWTH
Culture: NO GROWTH

## 2010-12-13 LAB — CLOSTRIDIUM DIFFICILE EIA: C difficile Toxins A+B, EIA: NEGATIVE

## 2010-12-13 LAB — TROPONIN I: Troponin I: 0.09 ng/mL — ABNORMAL HIGH (ref 0.00–0.06)

## 2010-12-13 LAB — POCT CARDIAC MARKERS

## 2011-01-06 ENCOUNTER — Encounter: Payer: Self-pay | Admitting: Internal Medicine

## 2011-01-08 ENCOUNTER — Ambulatory Visit (INDEPENDENT_AMBULATORY_CARE_PROVIDER_SITE_OTHER): Payer: Medicare Other | Admitting: Internal Medicine

## 2011-01-08 ENCOUNTER — Encounter: Payer: Self-pay | Admitting: Internal Medicine

## 2011-01-08 ENCOUNTER — Ambulatory Visit (INDEPENDENT_AMBULATORY_CARE_PROVIDER_SITE_OTHER)
Admission: RE | Admit: 2011-01-08 | Discharge: 2011-01-08 | Disposition: A | Discharge status: Home / Self Care | Payer: Medicare Other | Source: Ambulatory Visit | Attending: Internal Medicine | Admitting: Internal Medicine

## 2011-01-08 DIAGNOSIS — J449 Chronic obstructive pulmonary disease, unspecified: Secondary | ICD-10-CM

## 2011-01-08 DIAGNOSIS — J984 Other disorders of lung: Secondary | ICD-10-CM

## 2011-01-08 DIAGNOSIS — J852 Abscess of lung without pneumonia: Secondary | ICD-10-CM

## 2011-01-08 DIAGNOSIS — R0902 Hypoxemia: Secondary | ICD-10-CM

## 2011-01-08 NOTE — Progress Notes (Signed)
SATURATION QUALIFICATIONS:  Patient Saturations on Room Air at Rest = 93%  Patient Saturations on Room Air while Ambulating = 87%  Patient Saturations on 2 Liters of oxygen while Ambulating = 93%   

## 2011-01-08 NOTE — Patient Instructions (Signed)
Please take your inhalers regularly - do you have refills, if not my nurse will  Send it You need oxygen to use at night and day time  - we will set it up Return to see me in 6 months or sooner if needed

## 2011-01-09 ENCOUNTER — Encounter: Payer: Self-pay | Admitting: Internal Medicine

## 2011-01-09 NOTE — Assessment & Plan Note (Signed)
Stable disease but has recurrent desaturation. CXR is clear. Inhaler non-compliance suspected as cause  PLAN Reemphasized compliance with mdi  Restart home o2 but also check overnight pulse ox

## 2011-01-09 NOTE — Progress Notes (Signed)
  Subjective:    Patient ID: [name], male    DOB: 1918-11-29, 77 y.o.   MRN: [id number]  HPIFollowup for COPD - s/p rehab compleetion Aug 2011.No desaturation walking 185 feet x 3 laps in Oct 2011 , LUL Nodule - pseudotumor -, 2-3cm, PET negative March 2011. Background hx of LLL necrotixing pna and dysphagia/peg tube - Summer 2010 with clearane by March 2011.   OV 01/08/2011: Followup COPD. Presents with wife. He has turned 89. Denies active complaints. No change in dyspnea, baseline cough. FEels well. Still using PEG 100% of the time but appears to be having increasing reflux. Not seen Dr. Loreta Ave of GI in a long time per wife. We noticed he desaturated walking across hallways; this is a new finding for him. So we got cxr and did not show any infiltrates to account for new exertional hypoxemia. He then admitted to noncompliance with all inhalers.   Review of Systems Increased reflux + Non compliance with meds + Otherwise no change and 12 point ROS negative and per HPI     Objective:   Physical Exam General:  well developed, well nourished, in no acute distress.   Improved weight. healthy appearing.   Head:  normocephalic and atraumatic Eyes:  PERRLA/EOM intact; conjunctiva and sclera clear Ears:  TMs intact and clear with normal canals Nose:  no deformity, discharge, inflammation, or lesions Mouth:  no deformity or lesions Neck:  no masses, thyromegaly, or abnormal cervical nodes Chest Wall:  no deformities noted Lungs:  decreased BS bilateral and prolonged exhilation.   Heart:  regular rate and rhythm, S1, S2 without murmurs, rubs, gallops, or clicks Abdomen:  soft normal bowel sounds peg tube + Msk:  no deformity or scoliosis noted with normal posture Pulses:  pulses normal Extremities:  no clubbing, cyanosis, edema, or deformity noted Neurologic:  CN II-XII grossly intact with normal reflexes, coordination, muscle strength and tone Skin:  intact without lesions or  rashes Cervical Nodes:  no significant adenopathy       Assessment & Plan:

## 2011-01-19 NOTE — Discharge Summary (Signed)
NAME:  [name], DOSANJH               ACCOUNT NO.:  [id number]   MEDICAL RECORD NO.:  [id number]          PATIENT TYPE:  INP   LOCATION:  6731                         FACILITY:  [facility]   PHYSICIAN:  Mohan N. Sharyn Lull, M.D. DATE OF BIRTH:  12/09/1918   DATE OF ADMISSION:  09/11/2007  DATE OF DISCHARGE:  09/15/2007                               DISCHARGE SUMMARY   ADMISSION DIAGNOSES:  1. Non-Q wave myocardial infarction.  Scan to rule out inferior wall      myocardial infarction due to left anterior fascicular block.  2. Hypertension.  3. History of tobacco abuse.  4. History of alcohol abuse.  5. Gastroesophageal reflux disease.  6. History of esophageal stricture.   FINAL DIAGNOSES:  1. Status post acute interior wall myocardial infarction status post      percutaneous transluminal coronary angioplasty and stenting to mid      right coronary artery.  2. Hypertension.  3. Hypercholesterolemia.  4. History of tobacco abuse.  5. Chronic obstructive pulmonary disease.  6. History of alcohol abuse.  7. Gastroesophageal reflux disease.  8. History of esophageal stricture in the past.   DISCHARGE MEDICATIONS:  1. Enteric-coated aspirin 81 mg one tablet daily.  2. Plavix 75 mg one tablet daily with food.  3. Toprol XL 25 mg one tablet daily.  4. Simvastatin 40 mg one tablet daily at night.  5. Nexium 40 mg one capsule daily a half hour before breakfast.  6. Nitrostat 0.4 mg sublingual, use as directed.   DISCHARGE DIET:  Low salt, low cholesterol.   Post PTCA and stent instructions have been given.   DISCHARGE ACTIVITIES:  Increase activity slowly as tolerated.  The  patient has been scheduled for phase II cardiac rehab as outpatient.   CONDITION ON DISCHARGE:  Stable.   DISCHARGE FOLLOWUP:  Follow up with me in one week.   BRIEF HISTORY AND HOSPITAL COURSE:  [name] is an 77 year old black  male with a past medical history significant for hypertension, GERD,  esophageal  stricture, history of tobacco abuse, history of alcohol abuse  in the remote past.  He came to the ER complaining of retrosternal chest  pain off and on since yesterday at 10 a.m.  A dull ache and grade 8/10,  radiating to the left arm associated with nausea.  The patient did not  seek any medical attention until this morning, on January 5, when his  wife drove him to the ER.  The patient denies any diaphoresis.  Denies  shortness of breath.  Denies palpitations, lightheadedness or syncope.  Denies PND, orthopnea, leg swelling.  EKG done in the ER showed normal  sinus rhythm with left anterior fascicular block, right bundle branch  block with ST-T wave changes and was noted to have elevated CPK MB and  troponin-I of 22.0.   PAST MEDICAL HISTORY:  As above.   PAST SURGICAL HISTORY:  1. He had an abdominal aortic aneurysm repair in 2005.  2. He had a ventral hernia repair.  3. History of esophageal dilatation in the past.   ALLERGIES:  No known  drug allergies.   HOME MEDICATIONS:  He is on:  1. Nexium 40 mg p.o. daily.  2. Aspirin as needed.  3. Questionable blood pressure medicine.   SOCIAL HISTORY:  He is married.  Retired.  Smoked one pack per day for  15-20 years.  Quit in the 1980's.  Used to drink heavy for 15-20 years,  quit also in the 1980's.   FAMILY HISTORY:  Noncontributory.   PHYSICAL EXAMINATION:  GENERAL:  He is alert, awake, oriented x3.  No  acute distress.  VITAL SIGNS:  Blood pressure 118/76, pulse 78 regular.  EYES:  Conjunctivae are pink.  NECK:  Supple.  No JVD.  No bruits.  LUNGS:  Decreased breath sounds at bases.  CARDIOVASCULAR:  S1 and S2 are normal.  There is a soft systolic murmur  and S4 gallop.  ABDOMEN:  Soft.  Bowel sounds are present.  Nontender.  EXTREMITIES:  No clubbing, cyanosis or edema.   LABORATORY DATA:  Chest x-ray showed no acute abnormalities.  Severe  COPD changes.  Other labs are not available in the chart.  His  cholesterol  was 136, LDL 80, HDL 49.  Hemoglobin was 11.2, hematocrit  33, white count of 9.9, CPK was 2247, MB 268, relative index 11.9,  troponin-I was above 100.  Repeat troponi-I on January 7 is 42.38, CPK  is 648, MB 34.8, relative index 5.4.  Hemoglobin and hematocrit have  been stable at 11.7 and 34.9.  His labs today are CPK 242, MB 11.4,  relative index 4.7, troponin-I has come down to 28.48.  Potassium is  3.9, BUN 17, creatinine 1.09, glucose 90, hemoglobin 11.7, hematocrit  35.4, white count of 5.9.   BRIEF HOSPITAL COURSE:  The patient was emergently taken to the cath lab  and underwent left cardiac catheterization with selective left and right  coronary angiography and PTCA stenting to 100% occluded RCA and  temporary transvenous pacemaker as per procedure report.  The patient  tolerated the procedure well.  There were no complications post-  procedure.  The patient did not have any episodes of chest pain during  the hospital stay.  Phase I cardiac rehab was called.  The patient has  been ambulating in the hallway without any problems.  Her cardiac  markers have gradually come towards normal.  His groin is stable with no  evidence of hematoma or bruit.  The patient is scheduled for phase II  cardiac rehab as an outpatient and will be discharged home on the above  medications and will be followed up in the office in 1 week and with Dr.  Shana Chute in 2 weeks.      [name]. Sharyn Lull, M.D.  Electronically Signed     MNH/MEDQ  D:  09/15/2007  T:  09/15/2007  Job:  [id number]   cc:   Osvaldo Shipper. Spruill, M.D.  [name]. Sharyn Lull, M.D.

## 2011-01-19 NOTE — Cardiovascular Report (Signed)
NAME:  [name], [name]               ACCOUNT NO.:  [id number]   MEDICAL RECORD NO.:  [id number]          PATIENT TYPE:  OIB   LOCATION:  1961                         FACILITY:  [facility]   PHYSICIAN:  Mohan N. Sharyn Lull, M.D. DATE OF BIRTH:  February 18, 1919   DATE OF PROCEDURE:  09/10/2008  DATE OF DISCHARGE:  09/10/2008                            CARDIAC CATHETERIZATION   PROCEDURE:  Left cardiac catheterization with selective left and right  coronary angiography, left ventricular graft to the right groin using  Judkins technique.   INDICATIONS FOR PROCEDURE:  Mr. Chalfin is an 77 year old black male with  past medical history significant for coronary artery disease, history of  inferior wall MI, status post PCI to RCA.  He has Cypher drug-eluting  stent 2.75 x 23 mm long to mid RCA, hypertension, hypercholesteremia,  GERD, history of esophageal stricture, history of diverticulosis, iron-  deficiency anemia, and history of alcohol abuse and tobacco abuse,  complains of exertional chest pain associated with shortness of breath  associated with feeling tired, weak, and fatigue.  EKG done in the  office showed normal sinus rhythm with right bundle-branch block with T-  wave inversions in the inferior lead.  The patient denies any chest pain  at present.  Denied any nausea, vomiting, or diaphoresis.  Denies  palpitation, lightheadedness, or syncope.   PAST MEDICAL HISTORY:  As above.   PAST SURGICAL HISTORY:  He had AAA repair, had ventral hernia repair,  had multiple esophageal dilatation in the past.  Subsequently, he had  PEG tube insertion.   SOCIAL HISTORY:  He is married, retired,  He smoked 1 pack per day for  20+ years, quit in 5s.  He used to drink heavy for 15-20 years, quit  also in 1980s.   HOME MEDICATIONS:  He is on enteric-coated aspirin 81 mg p.o. daily,  Plavix 75 mg p.o. daily, Toprol-XL 25 mg p.o. daily, nystatin 40 mg p.o.  daily, and Nitrostat sublingual p.r.n.   FAMILY HISTORY:  Noncontributory.   PHYSICAL EXAMINATION:  GENERAL:  He is alert, awake, and oriented x3 in  no acute distress.  VITAL SIGNS:  Blood pressure was 130/70, pulse was 70 and regular.  EYES:  Conjunctivae are pink.  NECK:  Supple.  No JVD.  No bruit.  LUNGS:  Clear to auscultation without rhonchi or rales.  CARDIOVASCULAR:  S1 and S2 was normal.  There was soft systolic murmur.  ABDOMEN:  Soft.  Bowel sounds present.  PEG site was okay.  EXTREMITIES:  There is no clubbing, cyanosis, or edema.   IMPRESSION:  New onset angina, rule-out restenosis, coronary artery  disease, history of inferior wall myocardial infarction, status post  percutaneous coronary intervention to mid right coronary artery in the  past, chronic obstructive pulmonary disease, hypercholesteremia,  hypertension, history of esophageal stricture, history of tobacco abuse,  history of alcohol abuse, status post dysphagia, status post PEG tube  insertion.  I discussed with the patient regarding noninvasive stress  testing versus left catheterization, its risks and benefits, i.e.,  death, MI, stroke, local vascular complications, etc., and consented for  the  procedure.   PROCEDURE:  After obtaining informed consent, the patient was brought to  the Cath Lab and was placed on the fluoroscopy table.  Right groin was  prepped and draped in the usual fashion.  Xylocaine 2% was used for  local anesthesia in right groin.  With help of thin-wall needle, a 6-  French arterial sheath was placed.  The sheath was aspirated and  flushed.  Next, a 6-French left Judkins catheter was advanced over the  wire under fluoroscopic guidance into the ascending aorta.  Wire was  pulled out.  The catheter was aspirated and connected to the manifold.  Catheter was further advanced and engaged into left coronary ostium.  Multiple views of the left system were taken.  Next, catheter was  disengaged and was pulled out over the wire and  was replaced with a 4-  [place] 3-D diagnostic catheter, which was advanced over the wire under  fluoroscopic guidance into the ascending aorta.  Wire was pulled out.  The catheter was aspirated and connected to the manifold.  Catheter was  further advanced and engaged into the right coronary ostium.  Multiple  views of the right system were taken.  Next, catheter was disengaged and  was pulled out over the wire and was replaced with a 4-French pigtail  catheter, which was advanced over the wire under fluoroscopic guidance  up to the ascending aorta.  Wire was pulled out.  The catheter was  aspirated and connected to the manifold.  Catheter was further advanced  across the aortic valve into the LV.  LV pressures were recorded.  Next,  left ventriculography was done in 30-degree RAO position.  Postangiographic pressures were recorded from LV and then pullback  pressures were recorded from the aorta.  There was no gradient across  the aortic valve.  Next, a pigtail catheter was pulled out over the  wire.  Sheath was aspirated and flushed.   FINDINGS:  LV showed __________ inferior wall hypokinesia, EF of 55%.  Left main was short which was patent.  LAD has 10-15% __________  stenosis.  Diagonal 1 is small vessel with mild disease.  Diagonal 2 is  very, very small.  Left circumflex has 10-15% mid stenosis.  OM-1 was  very, very small.  OM-2 was small with 20-30% mid stenosis.  RCA has 20-  30% ostial stenosis and 20-25% proximal stenosis just prior to the stent  of midportion.  Mid portion of that vessel  was widely patent.  PDA and  PLV branches are small which are patent.   The patient tolerated the procedure well.  There were no complications.  The patient was transferred to the recovery room in stable condition.      Eduardo Osier. Sharyn Lull, M.D.  Electronically Signed     MNH/MEDQ  D:  09/10/2008  T:  09/10/2008  Job:  [id number]

## 2011-01-19 NOTE — Consult Note (Signed)
[name], [name]               ACCOUNT NO.:  [id number]   MEDICAL RECORD NO.:  [id number]          PATIENT TYPE:  INP   LOCATION:  2035                         FACILITY:  [facility]   PHYSICIAN:  Oretha Milch, MD      DATE OF BIRTH:  Jan 13, 1919   DATE OF CONSULTATION:  03/26/2009  DATE OF DISCHARGE:                                 CONSULTATION   REQUESTING PHYSICIAN:  Osvaldo Shipper. Spruill, MD   REASON FOR CONSULTATION:  Lung abscess.   HISTORY OF PRESENT ILLNESS:  Mr. Vallecillo is a pleasant 77 year old  gentleman with multiple medical problems, most significant being the  esophageal stricture requiring PEG placement and chronic malnutrition as  a result.  I note that he sees Dr. Loreta Ave for this.  His last esophageal  dilatation was complicated by bleeding while he was on Plavix and he had  a prolonged admission in August 2009.  He was admitted on March 20, 2009  by Dr. Sharyn Lull for fever, chest pain, and yellow phlegm and chest x-ray  showed extensive infiltrate in the lateral left lingula and lower lobe.  He was initially treated with Rocephin, Zithromax, clindamycin, and  vancomycin.  He was subsequently changed to Zosyn and vancomycin.  His  course was complicated by loose stools and I note that one specimen was  negative for C. diff.  Chest x-ray on March 22, 2009 shows improved  aeration of the left lung with lingular infiltrate and cavitation.  He  has defervesced but continues to produce copious amounts of purulent  phlegm.   PAST MEDICAL HISTORY:  1. Coronary artery disease.  2. History of inferior wall MI status post PTCA and stent to RCA in      2009.  3. Hypertension.  4. Hypercholesterolemia.  5. GERD with gastritis.  6. Hiatal hernia.  7. Esophageal strictures with PEG placement in June 2009 with severe      esophagitis.  8. Chronic diverticulosis.  9. Intermittent constipation.  10.Recurrent hiccups.  11.Iron-deficiency anemia.  12.History of alcohol and tobacco abuse  in the past.   PAST SURGICAL HISTORY:  Status post ventral hernia repair surgery and  multiple esophageal dilatations, status post AAA repair, PEG in June  2009, status post tonsillectomy, and hemorrhoidectomy.   CURRENT MEDICATIONS:  1. Albuterol nebs 4 times a day.  2. Aspirin 81 mg daily.  3. Baclofen 5 mg 2 t.i.d.  4. Plavix 75 mg daily.  5. Enoxaparin 30 mg q.24 subcu.  6. Jevity 1.2 at q.6 h.  7. Metoprolol 6.25 mg b.i.d.  8. Protonix 40 mg b.i.d.  9. IV Zosyn and vancomycin.  10.Zolpidem 5 mg p.o. at bedtime p.r.n.  11.Tylenol p.r.n.   ALLERGIES:  No known drug allergies.   SOCIAL HISTORY:  He is a retired Research scientist (physical sciences).  He has smoked about 1  pack per day for 30 years, quit 30 years ago.  He is married with two  children.  He used to drink a pint of wine and whisky but quit in the  80s.   FAMILY HISTORY:  Noncontributory.   REVIEW OF  SYSTEMS:  Reports copious amounts of phlegm production.  Pleuritic left-sided chest pain is better.  Denies fevers, abdominal  pain, nausea, and reports loose stools.  No dizziness or vomiting.   PHYSICAL EXAMINATION:  GENERAL:  Adult gentleman sitting up in bed in  mild respiratory distress using accessory muscles to breathe.  VITAL SIGNS:  Respirations 20, afebrile last 24 hours, heart rate 85,  blood pressure 111/70, and O2 saturation 97% on 2 L nasal cannula.  HEENT:  Dry mucosa, pallor present.  No icterus.  1+ clubbing.  NECK:  Supple.  No JVD.  No lymphadenopathy.  CVS:  S1-S2 normal.  CHEST:  Crackles in the left axilla and base.  Right side clear.  No  rhonchi.  ABDOMEN:  Soft and nontender.  NEUROLOGIC:  Nonfocal.  EXTREMITIES:  No edema.   LABORATORY DATA:  Blood cultures x2 negative.  C. diff toxin negative on  March 23, 2009.  WBC 10.6, hemoglobin 11.8, and platelets 399.  BUN and  creatinine were 12/1.09, sodium 140, potassium 3.7, bicarbonate 23,  chloride 107, anion gap 10, and calcium 8.3.  Respiratory culture  showed  Gram-positive cocci, Gram-negative rods, but no growth on culture.  Urine culture on March 20, 2009 was negative.   IMPRESSION:  1. Aspiration pneumonia.  2. Cavitation is very suggestive of lung abscess.  3. Malnutrition.  4. Antibiotic associated diarrhea.  5. Esophageal stricture, PEG feeding   RECOMMENDATIONS:  1. He will likely need a prolonged course of antibiotics.  Since MRSA      has not been isolated, I think we can safely discontinued      vancomycin at the current time.  We will continue IV Zosyn for now.      He can eventually be discharged on either p.o. Augmentin or a      combination of Avelox and clindamycin.  He will need a total      duration of 4-6 weeks of antibiotics depending on radiographic and      clinical improvement down the line.  2. I agree with obtaining a CT chest without contrast at the current      time and placing a PPD.  3. Postural drainage with special attention to left lower lobe can be      attempted.  4. Antibiotic associated diarrhea may be a complicating issue.  I note      that C. diff has been negative.  I will ask the patient to formally      consult about changing his tube feeds.   Thanks Dr. Shana Chute for involving [place] in the care of this patient.  We  will assist with his management during his hospital stay and after.  I  would also suggest increasing the level of palliation here given his  overall decline over the last year.      Oretha Milch, MD  Electronically Signed     RVA/MEDQ  D:  03/26/2009  T:  03/26/2009  Job:  [id number]

## 2011-01-19 NOTE — Op Note (Signed)
NAME:  [name], [name]               ACCOUNT NO.:  [id number]   MEDICAL RECORD NO.:  [id number]          PATIENT TYPE:  INP   LOCATION:  3705                         FACILITY:  [facility]   PHYSICIAN:  Anselmo Rod, M.D.  DATE OF BIRTH:  09-09-1918   DATE OF PROCEDURE:  10/11/2007  DATE OF DISCHARGE:                               OPERATIVE REPORT   PROCEDURE PERFORMED:  Esophagoscopy.   ENDOSCOPIST:  Anselmo Rod, M.D.   INSTRUMENT USED:  Pentax video panendoscope.   INDICATIONS FOR PROCEDURE:  77 year old [place] American male with a  history of severe dysphagia and recurrent hiccups undergoing an EGD.  The patient has had significant weight loss in the recent past because  of inability to eat. He has recurrent nausea and vomiting, as well.   PREPROCEDURE PREPARATION:  Informed consent was procured from the  patient.  The patient was fasted for eight hours prior to the procedure.  The risks and benefits of the procedure were discussed with the patient  including the risks of bleeding, perforation, etc.  The patient is on  anticoagulation for her heart disease and recent stent placement.   PREPROCEDURE PHYSICAL:  Patient with stable vital signs.  Neck supple.  Chest clear to auscultation.  S1 and S2 regular.  Abdomen soft with  normal bowel sounds.   DESCRIPTION OF PROCEDURE:  The patient was placed in the left lateral  decubitus position, sedated gently with 25 mcg Fentanyl and 3 mg Versed  given intravenously in slow incremental doses.  Once the patient was  adequately sedated, maintained on low flow oxygen and continuous cardiac  monitoring, the Pentax video panendoscope was advanced through the mouth  piece over the tongue into the esophagus.  There was severe grade 4  distal esophagitis. Multiple washes were done.  It was difficult  maneuvering the scope through the distal esophageal sphincter and,  therefore, the procedure was limited, the stomach and proximal small  bowel were not visualized to avoid problems with perforation.  The  tissue was macerated at the GE junction.  Multiple washes were  inadequate to visualize the underlying mucosa.  The esophagitis seemed  to be more severe in the distal third of the esophagus.  The patient  tolerated the procedure well without complications.   IMPRESSION:  Severe LA Grade 4 distal esophagitis with stricturing at  the GE junction. The stomach and proximal small bowel are not  visualized.   RECOMMENDATIONS:  1. As discussed with Dr. Shana Chute, a PICC line will be placed tomorrow      and the patient will be started on TPN.  2. Change Protonix to IV 40  mg per day.  3. Allow the patient clear liquids or ice chips for now.  4. Further recommendations will be made in follow up.      Anselmo Rod, M.D.  Electronically Signed     JNM/MEDQ  D:  10/11/2007  T:  10/11/2007  Job:  [id number]   cc:   Osvaldo Shipper. Spruill, M.D.

## 2011-01-19 NOTE — Op Note (Signed)
NAME:  [name], [name]               ACCOUNT NO.:  [id number]   MEDICAL RECORD NO.:  [id number]          PATIENT TYPE:  AMB   LOCATION:  ENDO                         FACILITY:  [facility]   PHYSICIAN:  Anselmo Rod, M.D.  DATE OF BIRTH:  [birth year]   DATE OF PROCEDURE:  07/03/2007  DATE OF DISCHARGE:                               OPERATIVE REPORT   PROCEDURE PERFORMED:  Esophagogastroduodenoscopy with balloon dilatation  of distal esophageal stricture.   ENDOSCOPIST:  Anselmo Rod, M.D.   INSTRUMENT USED:  Pentax video panendoscope.   INDICATIONS FOR PROCEDURE:  77 year old African American male with a  history of chronic reflux and esophageal strictures, undergoing a repeat  EGD for recurrent dysphagia and chronic hiccups.   PREPROCEDURE PREPARATION:  Informed consent was procured from the  patient.  The patient was fasted for eight  hours prior to the  procedure.  Risks and benefits of the procedure were discussed with the  patient in great detail.   PREPROCEDURE PHYSICAL:  The patient had stable vital signs except for  blood pressure 190/110 (The patient did not take his antihypertensive  this morning.)  NECK:  Supple.  Chest clear to auscultation.  S1, S2  regular.  Abdomen soft with normal bowel sounds.   DESCRIPTION OF PROCEDURE:  The patient was placed in the left lateral  decubitus position and sedated with 50 mcg of Fentanyl and 3 mg of  Versed given intravenously in slow incremental doses.  Once the patient  was adequately sedated and maintained on low-flow oxygen and continuous  cardiac monitoring, the Pentax video panendoscope was advanced through  the mouthpiece over the tongue into the esophagus under direct vision.  The proximal esophagus appeared normal but had a corkscrew appearance.  The distal esophagus seemed somewhat inflamed with grade 1 to grade 2  distal esophagitis with mild stricturing at 40 cm.  A small hiatal  hernia was seen on high retroflexion.   There was minimal bleeding with  passage of the scope.  Mild gastritis was noted throughout the gastric  mucosa. The proximal small bowel appeared normal.  A small hiatal hernia  was seen on high retroflexion distal esophageal stricture was dilated  with a controlled radial expansion balloon dilator at 12 mm and then at  15 mm.  The patient tolerated the procedure well without complications.   IMPRESSION:  1. Distal esophagitis with mild stricturing at 40 cm dilated with a      CRE balloon dilator up to 15 mm.  2. Mild diffuse gastritis.  3. Normal proximal small bowel.   RECOMMENDATIONS:  1. Continue Nexium.  2. Soft diet the next couple of days.  3. Repeat esophagogastroduodenoscopy with dilatation in the next four      weeks.      Anselmo Rod, M.D.  Electronically Signed     JNM/MEDQ  D:  07/03/2007  T:  07/03/2007  Job:  [id number]   cc:   Osvaldo Shipper. Spruill, M.D.  Fax: [phone]

## 2011-01-19 NOTE — H&P (Signed)
[name], [name]               ACCOUNT NO.:  [id number]   MEDICAL RECORD NO.:  [id number]          PATIENT TYPE:  IPS   LOCATION:  4029                         FACILITY:  [facility]   PHYSICIAN:  Ranelle Oyster, M.D.DATE OF BIRTH:  1918-12-16   DATE OF ADMISSION:  04/04/2009  DATE OF DISCHARGE:                              HISTORY & PHYSICAL   PULMONOLOGIST:  Kalman Shan, MD   HISTORY OF PRESENT ILLNESS:  This is an 77 year old African American  male with CAD and atrial fibrillation with esophageal stricture leading  to severe stenosis and dysphagia.  He has had a PEG in for over a year.  He was admitted on March 20, 2009 with pleuritic chest pain and fever of  101.8, white count of 23,000.  Chest x-ray revealed left midlung  airspace disease with questionable areas of necrosis and extra alveolar  air.  The patient was started on IV Zosyn and vancomycin for treatment.  CT of the chest was ultimately done showing a left lower lobe  consolidation with air-fluid levels and large mediastinal nodes.  Some  question of neoplasm was raised.  Pulmonary Medicine felt that this was  an infectious process and recommended antibiotics of 4-6 weeks.   The patient has had many issues including hypoxia and continues on 3  liters of oxygen.  He developed the few beats of V-tach as well as  atrial fibrillation with rate control.  The patient was changed to p.o.  Cipro and Cleocin on March 31, 2009.  He has had issues of diarrhea and  C. diff has been checked x1 and was negative.  Therapies were initiated.  The patient continued to have struggles with mobility and self-care.  Rehab was counseled yesterday and felt the patient could benefit from an  inpatient admission and ultimately he was brought today.   REVIEW OF SYSTEMS:  Notable for weakness, shortness of breath, diarrhea,  urinary frequency, hematemesis.  He does feel that shortness of breath  has improved.  Full reviews in the written H  and P.   PAST MEDICAL HISTORY:  1. Positive for CAD with PTCA in January 2009.  2. Severe dysphagia due to above with PEG placement in a year ago.  3. Chronic hiccups.  4. Atrial fibrillation.  5. Esophagitis.  6. Hiatal hernia.  7. Hypertension.  8. 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  9. COPD.  10.Iron deficiency anemia.   FAMILY HISTORY:  Noncontributory due to advanced age.   SOCIAL HISTORY:  The patient is married, lives alone in a one-level  house with four steps to enter.  He has a history of alcohol abuse and  has quit for 30 years.  He quit tobacco 30 years ago as well.  Wife is  at home who can provide supervision.   ALLERGIES:  None.   MEDICATIONS:  Aspirin, baclofen, iron, Megace, Nexium, nitroglycerin,  and Jevity.   LABS:  Hemoglobin 10.9, white count 5.6, platelets 471.  Sodium 139,  potassium 4.2, BUN 25, creatinine 1.48.  Prealbumin 9.9.   PHYSICAL EXAMINATION:  VITAL SIGNS:  Blood  pressure is 109/66, pulse is  84, respiratory rate is 16, temperature 98.6.  GENERAL:  The patient is sitting in bed, fairly frail.  HEENT:  Pupils are equal, round, and reactive to light.  Ear, nose and  throat exam are notable for dentures in place.  Mucosa is otherwise pink  and moist.  NECK:  Supple without JVD or lymphadenopathy.  CHEST:  Notable for diffuse crackles and rhonchi, left more than right.  HEART:  Irregularly irregular.  Rate was controlled.  ABDOMEN:  Soft, nontender.  Bowel sounds are positive.  SKIN:  Notable for dry feet bilaterally, but no obvious breakdown was  noted today.  NEUROLOGIC:  Cranial nerves II through XII are grossly intact.  Reflexes  are 2+.  Sensation is intact as well.  The patient is able to move all  four extremities today.  He was 3/5 at the hips, 3+ to 4/5 at the knees,  and 4 to 5/5 at the ankles.  Upper extremity strength is near 4 to 5/5  throughout.  Judgment, orientation, memory, and mood were all  appropriate.    POST ADMISSION PHYSICIAN EVALUATION:  1. Functional deficits secondary to pneumonia due to likely      aspiration.  2. The patient is admitted to receive collaborative interdisciplinary      care between the physiatrist, rehab nursing staff, and therapy      team.  3. The patient's level of medical complexity and substantial therapy      needs in context of that medical necessity cannot be provided at a      lesser intensity of care.  4. The patient has experienced substantial functional loss from his      baseline.  Upon functional assessment at the time of preadmission      screening, the patient was min assist for basic transfers and      mobility, ambulate 15 feet.  He is min assist with ADLs.  This      assessment was within 24 hours of this admission today.  The      patient was independent with a walker prior to admission.  Judging      by the patient's diagnosis, physical exam, and functional history,      he has the potential for functional progress, which will result in      measurable gains while in inpatient rehab.  These gains will be of      substantial and practical use upon discharge to home and      facilitating mobility and self-care.  Interim changes in medical      status since preadmission screening are detailed in history of      present illness above.  5. Physiatrist will provide 24-hour management of medical needs as      well as oversight of the therapy plan/treatment and provide      guidance as appropriate regarding interaction of two.  6. 24-hour rehab nursing will assist in the management of the      patient's bowel and bladder function as well as skin care,      nutrition, and integration of therapy concepts, techniques,      education, etc.  7. PT will assess and treat for lower extremity strength, functional      mobility, gait, range of motion, endurance, and balance with goals      supervision to modified independent.  8. OT will assess and treat for  upper extremity use and ADLs, adaptive  equipment and techniques, safety awareness, family and patient      education with goals supervision to modified independent.  9. Case management and social worker will assess and treat for      psychosocial issues and discharge planning.  10.Team conferences will be held weekly to assess progress towards      goals and to determine barriers to discharge.  11.The patient has demonstrated sufficient medical stability and      exercise capacity to tolerate at least 3 hours of therapy per day      at least 5 days per week.  12.Estimated length of stay is approximately 1 weeks' time.  Prognosis      is good.   MEDICAL PROBLEM LIST AND PLAN:  1. CAD:  Continue to monitor.  Watch the patient's I's and O,s as well      as heart rate.  2. Dysphagia:  Continue n.p.o. for now due to aspiration.  3. We will schedule baclofen for hiccup control.  4. Iron deficiency anemia:  Resume iron supplements due to recent drop      in hemoglobin and follow serially.  No overt signs of blood loss.      Heme check stools.  5. Acute renal failure:  This has improved.  We will stop IV fluids      and add water flushes through the PEG.  6. Leukocytosis:  This has resolved.  We will follow up.  The patient      is on oral antibiotics.  7. Hypoxia:  Increase nebulizers in t.i.d.  Encourage out of bed,      incentive spirometry, pulmonary toilet, etc.  Oxygen continues at 2-      3 liters.      Ranelle Oyster, M.D.  Electronically Signed     ZTS/MEDQ  D:  04/04/2009  T:  04/05/2009  Job:  [id number]   cc:   Kalman Shan, MD

## 2011-01-19 NOTE — Letter (Signed)
November 05, 2009   Osvaldo Shipper. Spruill, MD  [address] [address]  [place], [place] [address]   Re:  OLYVER, HAWES               DOB:  07-31-1919   Dear Dorene Sorrow,   I saw the patient back today and his left lower lobe lesion is  interesting in that it is so smooth and it has come up so fast, but it  definitely is there.  It is a 3.9-cm mass in the posterior portion of  the left upper lobe, some nodules from a mediastinoscopy, nodules were  small, but some of the mediastinal nodes are present, but small.  I have  gone ahead and ordered a PET scan on him before they take other biopsies  to see whether this is a positive scan and if any nodes are positive.  He will see me back next week after his PET scan.  His blood pressure  was 153/90, pulse 88, respirations 16, sats were 92%.  His FVC was 2.66  with an FEV1 of 1.42 with a diffusion capacity of 22%.  There were  multiple bullae on the CT scan.   Ines Bloomer, M.D.  Electronically Signed   DPB/MEDQ  D:  11/05/2009  T:  11/06/2009  Job:  [id number]   cc:   Kalman Shan, MD

## 2011-01-19 NOTE — Op Note (Signed)
NAME:  [name], [name]               ACCOUNT NO.:  [id number]   MEDICAL RECORD NO.:  [id number]          PATIENT TYPE:  AMB   LOCATION:  ENDO                         [facility]ACILITY:  [facility]   PHYSICIAN:  Anselmo Rod, M.D.  DATE OF BIRTH:  March 08, 1919   DATE OF PROCEDURE:  07/31/2007  DATE OF DISCHARGE:                               OPERATIVE REPORT   PROCEDURE PERFORMED:  Esophagogastroduodenoscopy with balloon dilatation  of a distal esophageal stricture.   ENDOSCOPIST:  Anselmo Rod, M.D.   INSTRUMENT USED:  Pentax video panendoscope.   INDICATIONS FOR PROCEDURE:  An 77 year old, African-American male with a  history of reflux, mild distal esophagitis and esophageal stricture  undergoing a repeat dilatation for dysphagia.   PREPROCEDURE PREPARATION:  Informed consent was procured from the  patient.  The patient fasted for 8 hours prior to the procedure.  Risks  and benefits of the procedure were discussed with the patient in great  detail.   PREPROCEDURE PHYSICAL:  The patient had stable vital signs.  NECK:  Supple.  CHEST:  Clear to auscultation.  S1/S2 regular.  ABDOMEN:  Soft with normal bowel sounds.   DESCRIPTION OF PROCEDURE:  The patient was placed in the left lateral  decubitus position and sedated with 50 mcg of Fentanyl and 2 mg of  Versed given intravenously in slow incremental doses.  Once the patient  was adequately sedated and maintained on low-flow oxygen and continuous  cardiac monitoring, the Pentax video panendoscope was advanced through  the mouthpiece over the tongue into the esophagus under direct vision.  The vocal cords appeared healthy.  The proximal esophagus appeared  normal.  There was mild distal esophagitis with a stricture at the GE  junction.  An 8-cm long balloon dilator was used (CRE balloon), and the  stricture was serially dilated from 15 mm to 16.5 mm to 18 mm.  The  balloon was held in place for about 3 minutes with the balloon dilated  up  to 80 mm.  Repeat endoscopy was done after the balloon was removed,  and there was some bleeding from the site of the distal esophageal  stricture.  The rest of the gastric mucosa, and the proximal small bowel  appeared normal.  There was no outlet obstruction.  Retroflexion in the  high cardia revealed some fresh heme and a small hiatal hernia.  The  patient tolerated the procedure well without complications.   IMPRESSION:  1. Distal esophageal stricture with mild esophagitis, dilated with      controlled radial expansion balloon (see description above).  2. Small hiatal hernia.  3. Normal-appearing gastric mucosa and proximal small bowel.   RECOMMENDATIONS:  1. Continue Nexium suspension on a BID basis for the next 15 days and      decrease it to once a day thereafter.  2. Soft diet for next couple of days.  3. Outpatient follow-up in the next 2 weeks with further      recommendations.      Anselmo Rod, M.D.  Electronically Signed     JNM/MEDQ  D:  07/31/2007  T:  07/31/2007  Job:  [id number]

## 2011-01-19 NOTE — Cardiovascular Report (Signed)
NAME:  [name], [name]               ACCOUNT NO.:  [id number]   MEDICAL RECORD NO.:  [id number]          PATIENT TYPE:  INP   LOCATION:  6731                         FACILITY:  [facility]   PHYSICIAN:  Mohan N. Sharyn Lull, M.D. DATE OF BIRTH:  1919-02-26   DATE OF PROCEDURE:  09/11/2007  DATE OF DISCHARGE:                            CARDIAC CATHETERIZATION   PROCEDURE:  1. Left cardiac cath with selective left and right coronary      angiography, LV-graphy, insertion of temporary transvenous      pacemaker via right groin using Judkins technique.  2. Successful PTCA to 100% occluded mid RCA using 2.5 x 8 and 2.5 x 12      mm long Voyager balloon.  3. Successful deployment of 2.75 x 23 mm long Cypher drug-eluting      stent in mid RCA.  4. Successful post dilatation of Cypher drug-eluting stent using 2.75      x 15 mm long PowerSail balloon.   INDICATIONS FOR PROCEDURE:  [name] is an 77 year old black male with  past medical history significant for hypertension, GERD, history of  esophageal stricture, history of tobacco abuse, alcohol abuse in remote  past.  He came to the ER complaining of retrosternal chest pain off and  on since yesterday 10 a.m. described as dull aching, grade 8/10,  radiating to the left arm associated with nausea.  The patient did not  seek any medical attention until this morning when his wife drove him to  ER.  The patient denies any diaphoresis.  Denies shortness of breath.  Denies palpitation, lightheadedness or syncope.  Denies PND, orthopnea,  leg swelling.  EKG done in the ER showed normal sinus rhythm with left  anterior fascicular block, right bundle branch block with ST-T wave  changes, and was noted to have elevated CPK-MB and troponin I of 22.  Due to typical anginal chest pain, EKG changes, positive cardiac enzymes  and recurrent chest pain, discussed with patient and his wife regarding  emergency left cath, possible PTCA and stenting, its risks and  benefits  i.e. death, MI, stroke, need for emergency CABG, risk of restenosis,  local vascular complications, etc. and consented for the procedure.   PROCEDURE:  After obtaining the informed consent, the patient was  brought to the cath lab and was placed on the fluoroscopy table.  The  right groin was prepped and draped in the usual fashion, 2% Xylocaine  was used for local anesthesia in the right groin.  With the help of thin-  wall needle, 6-French arterial and 6-French venous sheaths were placed.  Both these sheaths were aspirated and flushed.  Next, 6-French left  Judkins catheter was advanced over the wire under fluoroscopic guidance  up to the ascending aorta.  Wire was pulled out, the catheter was  aspirated and connected to the manifold.  Catheter was further advanced  and engaged into left coronary ostium.  Next, multiple views of the left  system were taken.  Next, the catheter was disengaged and was pulled out  over the wire and was replaced with a 6-French right  Judkins catheter  which was advanced over the wire under fluoroscopic guidance up to the  ascending aorta.  Wire was pulled out, the catheter was aspirated and  connected to the manifold.  Catheter was further advanced and engaged  into right coronary ostium.  Multiple views of the right system were  taken.  Next, the catheter was disengaged and was pulled out over the  wire and was replaced with 6-French pigtail catheter which was advanced  over the wire under fluoroscopic guidance up to the ascending aorta.  Catheter was further advanced across the aortic valve into the LV.  LV  pressures were recorded.  Next LV graft was done in 30 degrees RAO  position.  Post angiographic pressures were recorded from LV and then  pullback pressures were recorded from the aorta.  There was no gradient  across the aortic valve.  Next, the pigtail catheter was pulled out over  the wire.  Sheaths aspirated and flushed.   FINDINGS:   LV showed mid inferior wall dyskinesia, EF of 45-50%.  Left  main was short which was patent.  LAD has 20-25% proximal stenosis and  60-70% distal stenosis.  Diagonal-1 to diagonal-4 were very small.  The  left circumflex has 20-30% proximal stenosis and then tapers down in AV  groove.  OM-1 is very small, OM-2 has 40-50% proximal stenosis.  RCA has  30% ostial and proximal stenosis and then 100% occluded.  Temporary  transvenous pacer was inserted via right femoral venous approach up to  RV apex without difficulty prior to PTCA stenting, which was  discontinued at the end of the procedure.   INTERVENTIONAL PROCEDURE:  Successful PTCA to mid RCA was done using 2.5  x 8 mm long and then 2.5 x 12 mm long Voyager balloon for predilatation  and then 2.75 x 23 mm long Cypher drug-eluting stent was deployed at 13  atmospheres pressure in mid RCA.  The stent was postdilated using 2.75 x  15 mm long PowerSail balloon going up to 18 atmospheres pressure.  The  lesion was dilated from 100% to 0% residual  with excellent TIMI grade 3  distal flow without evidence of dissection or distal embolization.  The  patient received weight-based Angiomax and 600 mg of Plavix during the  procedure.  The patient tolerated procedure well.  There were no  complications.  The patient was transferred to recovery room in stable  condition.      [name]. Sharyn Lull, M.D.  Electronically Signed     MNH/MEDQ  D:  09/15/2007  T:  09/15/2007  Job:  [id number]   cc:   Osvaldo Shipper. Spruill, M.D.

## 2011-01-19 NOTE — Discharge Summary (Signed)
[name], [name]               ACCOUNT NO.:  [id number]   MEDICAL RECORD NO.:  [id number]          PATIENT TYPE:  IPS   LOCATION:  4029                         FACILITY:  [facility]   PHYSICIAN:  Ranelle Oyster, M.D.DATE OF BIRTH:  07-Dec-1918   DATE OF ADMISSION:  04/04/2009  DATE OF DISCHARGE:  04/16/2009                               DISCHARGE SUMMARY   DISCHARGE DIAGNOSES:  1. Deconditioning in the setting of left lower lobe pneumonia.  2. Hypertension.  3. History of esophageal stricture with chronic dysphagia.  4. Chronic hiccups.  5. Tube feed-induced diarrhea.  6. Chronic atrial fibrillation.  7. Iron-deficiency anemia.  8. Mild hyponatremia.   HISTORY OF PRESENT ILLNESS:  [name] is an 76 year old male with  history of coronary artery disease, AFib, dysphagia with esophageal  stricture/stenosis status post PEG placement admitted on March 20, 2009,  with pleuritic chest pain, fever of 101.8, and leukocytosis with white  count of 23.3.  Chest x-ray done showed left mid lung airspace disease  with questionable area of necrosis and extra-alveolar air.  The patient  was started on IV Zosyn and mag for treatment.  Followup chest x-ray  questioned cavitation and Pulmonary was consulted for question of  interventional procedures.  CT chest recommended and this revealed left  lower lobe consolidation with air-fluid levels and large mediastinal  nodes with question of neoplasm.  Pulmonary felt patient with infectious  process and recommended antibiotics for 4-6 weeks and sputum cytology  was ordered.  The patient has continued to have issues with hypoxia  requiring 3 liters of O2.  He has had few beats of asymptomatic V tach  but continues in AFib, rate controlled.  He was changed to p.o. Cipro  and Cleocin on March 31, 2009.  He has had issues with diarrhea and C  diff checked x1 has been negative.  Therapies were initiated, and the  patient is noted to be limited by decreased  endurance level as well as  fatigue limiting his mobility.  He was evaluated by Rehab and felt that  he would benefit from CIR stay.   PAST MEDICAL HISTORY:  Significant for:  1. Coronary artery disease with PTCA in January 2009.  2. Severe dysphagia with PEG tube placement secondary to chronic      reflux.  3. Malnutrition.  4. Esophageal stricture and stenosis.  5. Chronic hiccups.  6. AFib.  7. History of esophagitis.  8. Right eye macular hole repair in 2003.  9. COPD.  10.Iron-deficiency anemia.  11.Hypertension.  12.Hiatal hernia.   ALLERGIES:  No known drug allergies.   Family history is noncontributory secondary to advanced age.   SOCIAL HISTORY:  The patient is married, lives in 1-level home with 4  steps at entry.  Has a history of alcohol abuse in the past, quit 30  years ago.  History of tobacco use, quit x30 years.  Wife is supportive  and can provide supervision past discharge.   FUNCTIONAL HISTORY:  The patient was independent for mobility with  walker.  Reports being able to walk without difficulty.  He  does not  drive.   FUNCTIONAL STATUS:  The patient is min assist with max cues for  transfers, min assist ambulating 60 feet with rolling walker with easy  fatigability.   PHYSICAL EXAMINATION:  VITAL SIGNS:  Blood pressure 109/66, pulse 84,  respiratory rate 16, temperature 98.6.  GENERAL:  The patient is frail-looking elderly male, lying in bed, no  acute distress.  HEENT:  Pupils equal, round, reactive to light.  Ear, nose, and throat  exam revealed dentures in place.  Oral mucosa is pink and moist.  NECK:  Supple without JVD or lymphadenopathy.  CHEST:  The patient is noted to have diffuse crackles and rhonchi, left  greater than right.  Also noted to have hemoptysis.  HEART:  Regularly irregular rhythm, rate controlled.  ABDOMEN:  Soft, nontender.  Bowel sounds are positive.  SKIN:  Dry feet bilaterally without breakdown.  NEUROLOGIC:  Cranial  nerves II through XII grossly intact.  Reflexes 2+.  Sensation intact.  The patient is able to move all 4 extremities.  He  has 3/5 strength at hips, 3+ to 4/5 at knees, 4-5/5 at ankles, upper  extremities near 4-5/5 throughout.  Judgment orientation, memory, mood  are all appropriate.   HOSPITAL COURSE:  [name] was admitted to Rehab on April 04, 2009, for inpatient therapies to consist of PT, OT at least 3 hours 5  days a week.  Past admission physiatrist, rehab RN, and therapy team  have worked together to provide customized collaborative  interdisciplinary care.  Rehab RN has been working with the patient on  bowel and bladder training as well as tube feed management.  The patient  was noted to have issues with reflux and intolerance of tube feeds of  315 mL q.i.d. and was noted to reflux tube feeds; therefore, this was  adjusted with tube feeds being decreased to 200 mL on q.i.d. basis.  The  patient also has reported increase in his chronic hiccup and baclofen  was increased to 5 times a day prior to tube feeds to help with  tolerance of tube feeds and prevent regurgitation due to hiccups.  The  patient's oxygenation has been monitored with p.r.n. O2 sat monitoring.  He has continued on 2 liters O2 throughout his stay.  Prior to discharge  sats checked with activity revealed the patient desating up to 84  minutes on room air taking 2 minutes to recover with rest.  The patient  has continued to have issues with loose watery stools.  C diff was  rechecked x2 and this has been negative.  The patient was started on  Flora-Q as well as Questran to help with his symptomatology.  The  patient had been changed to Osmolite to see if this would decrease his  stooling; however, as this was not effective, he was changed back to  Jevity 1.2 calories.  Dietary has evaluated the patient's nutritional  input and felt that the patient would require supplements of 300 mL 5  times a day and  he is increased to this amount at the time of discharge.  Wife has been instructed to watch for any increase in reflux or  regurgitation and if this occurs to cut feeding back to 250 mL which he  is tolerating currently.  The patient's renal status has been monitored  along while on tube feeds.  Initially, the patient was noted to have  some mild hyponatremia with sodium at 134.  CBC check showed  H and H at  10.8 and 32.6 with white count at 5.2 and platelets 374.  Last check of  labs of April 14, 2009, reveals H and H improved at 12.0 and 35.9, white  count stable at 5.9, platelets 360.  Check of lytes revealed sodium 136,  potassium 3.8, chloride 106, CO2 of 24, BUN 12, creatinine 0.97, glucose  153.  The patient's blood pressures have been monitored on b.i.d. basis  throughout the stay.  These have ranged from 110s to 120s systolic, 70s  to 80s diastolic.  Heart rate has been stable in 70s to 80s range.  Weight past admission is at 59 kg.   The patient did initially have questions about initiating a diet, as he  reported he was eating some at home.  Speech therapy did evaluate him on  acute and recommended barium esophagram.  This was done on April 07, 2009, with findings revealing nonpatency of distal esophagus compatible  with chronic esophageal obstruction.  Small amounts of barium passed  through thoracic esophagus but did not pass into the stomach and no  aspiration was noted.  Some spasm of thoracic esophagus was noted.  Due  to issues with chronic esophageal obstruction, the patient and wife were  instructed regarding continuing n.p.o. status for now.  They will follow  up with Dr. Loreta Ave regarding esophageal dilatation and input regarding  initiating diet sometime in the future.  A followup chest x-ray was done  during this stay to monitor for efficacy of treatment.  Last chest x-ray  of April 04, 2009, reveals no change in left lower lobe pneumonia with  small effusion,  bibasilar atelectases.  The patient continues on Cleocin  and Cipro currently.  He will complete 4 total weeks of antibiotic  therapy approximately April 20, 2009.  Question whether the patient  needs to continue antibiotics for 6 weeks.  The patient is set up for  follow up with Dr. Marchelle Gearing on April 22, 2009, with Pulmonary to give  final input regarding total duration of antibiotic therapy and whether  the patient would need biopsy and further workup in the future.   During his stay in Rehab, the patient was noted to be limited by  decreased endurance and poor tolerance of 3 hours with therapy and he  was changed over to 15 hours 7 days a week.  Therapy evaluation at  admission revealed the patient with extreme fatigue with minimal  activity, poor activity tolerance, decreased balance, decreased ability  to use energy conservation measures to carry out basic ADLs making this  difficult.  With spreading out of his therapy as well as schedule rest  breaks, the patient was able to improve his tolerance of therapies.  The  patient progressed very well on CIR to being overall modified  independent with the rest breaks for ADL tasks at sink level.  He does  continue to require 2 liters O2 and he fatigues easily with minimal  activity.  Initially, the patient was overall min assist for all  mobility.  He continues to be fatigued in afternoon.  He does  demonstrate higher level balance deficits but is safe in home  environment.  Currently, he is at modified independent for transfers and  ambulation short distances in the home environment.  Assistive device  was recommended as use for energy conservation; however, the patient has  declined this.  The patient will continue to receive further followup  home health PT, OT by [facility] past discharge.  Additionally,  home health RN has been arranged for followup with assistance with tube  feeds as needed.  On April 15, 2009, the  patient is discharged to home.   DISCHARGE MEDICATIONS:  1. Plavix 75 mg a day.  2. Lopressor 25 mg half p.o. b.i.d.  3. Cipro 500 mg b.i.d.  4. Spiriva 18 mcg 1 inhalation daily.  5. Flora-Q 1 b.i.d.  6. Baclofen 10 mg 5 times a day before meals.  7. Cleocin 300 mg q.8 h.  8. Questran 4 g one package mixed with 4-6 ounces of fluid nightly.  9. Ferrous sulfate 325 mg b.i.d.  10.Jevity 300 mL at 7:00 a.m., 11:00 a.m., 3:00 p.m., 7:00 p.m., and      10:00 p.m., each tube flushes 100 mL past tube feeds.  11.Nexium 40 mg a day.   Diet is n.p.o.  If tube feeds cause increased reflux or regurgitation,  decrease bolus amounts to 250 mL.   SPECIAL INSTRUCTIONS:  Do not use aspirin or Megace.  Continue O2 2  liters at all times.  [facility] to provide PT, OT, and RN.  A  24-hour supervision.  No strenuous activity.   FOLLOWUP:  The patient is to follow up with Dr. Riley Kill as needed; follow  up with Dr. Marchelle Gearing on April 22, 2009, at 3:10; follow up with Dr.  Shana Chute for routine check in 2 weeks; follow up with Dr. Loreta Ave for  routine check.      Greg Cutter, P.A.      Ranelle Oyster, M.D.  Electronically Signed    PP/MEDQ  D:  04/16/2009  T:  04/17/2009  Job:  [id number]   cc:   Osvaldo Shipper. Spruill, M.D.  Kalman Shan, MD  Anselmo Rod, M.D.

## 2011-01-19 NOTE — Assessment & Plan Note (Signed)
OFFICE VISIT   ELZY, TOMASELLO  DOB:  [birth year]                                        Jan 07, 2010  CHART #:  [id number]   HISTORY:  The patient comes in today for a 24-month followup.  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  He has previously undergone a CT scan, which showed a 3.9  cm mass in the posterior portion of the left upper lobe near the fissure  with a negative PET scan.  Since his last visit, he has been stable from  a pulmonary standpoint.  He continues to be followed by Dr. Loreta Ave for his  PEG and does report today that he is having some problems with  persistent reflux for which he will be following up with Dr. Loreta Ave this  week.  Otherwise, he is doing well.   PHYSICAL EXAMINATION:  Vital Signs:  Blood pressure is 165/99, pulse is  88, respirations 16, and O2 sat 94% on room air.  Lungs:  Clear to  auscultation.  Heart:  Regular rate and rhythm without murmurs, rubs, or  gallops.   DIAGNOSTIC TESTS:  Chest x-ray shows almost complete resolution of the  left lung mass.  It measures approximately 22 x 27 mm in the PA  projection down from previous measurement of 40 x 56 mm.   ASSESSMENT AND PLAN:  Dr. Edwyna Shell has reviewed the films and saw the  patient today.  It does appear that this was a pseudotumor as we are  suspected.  It appears to be nearly resolved at  this point.  We will not need to see him back for further followup, but  he may call if he has any problems and Dr. Edwyna Shell has related this  information to the patient.   Ines Bloomer, M.D.  Electronically Signed   GC/MEDQ  D:  01/07/2010  T:  01/08/2010  Job:  [id number]   cc:   Osvaldo Shipper. Spruill, M.D.  Kalman Shan, MD  Charna Elizabeth, MD

## 2011-01-19 NOTE — Letter (Signed)
October 31, 2009   Osvaldo Shipper. Spruill, M.D.  [address] [address]  [place]  [place] [address]   Re:  [name], [name]               DOB:  Apr 11, 1919   Dear Dr. Shana Chute:   The patient came today for followup of an abnormal chest x-ray.  He  apparently was in the hospital with left lower lobe aspiration pneumonia  occurred in August due to an esophageal stricture when he underwent a  PEG by Dr. Charna Elizabeth.  Had a CT scan done in December which showed  some reaction in his left lower lobe.  Chest x-ray done in February  shows left lower lobe superior segmental lesion that is about 4 cm in  diameter.  He continues to have problems with dysphagia and is using his  PEG.  A swallow study on September 08, 2009, showed complete obstruction of  the distal esophagus and marked esophageal dysmotility.  He had a recent  fall hitting his right shoulder with no fractures.  This lesion in the  left lower lobe is 5.5 cm.  He has a history of chronic obstructive  pulmonary disease.   His medications include ferrous sulfate, Plavix 75 mg a day, baclofen 5  mg a day, Nexium 40 mg a day, and Jevity.   He has no allergies.   FAMILY HISTORY:  Noncontributory.   SOCIAL HISTORY:  He does not drink.  Quit smoking in 2006 but smoked for  30 years.  He comes in today with his wife.   SURGICAL HISTORY:  He has had a right macular repair.  He had two  cardiac stents.   REVIEW OF SYSTEMS:  He has got shortness of breath with exertion.  He  has had weight loss.  His weight is 133 pounds.  He is 5 feet 9 inches.  He is on home oxygen.  He has reflux, dysphagia.  GU:  No kidney disease, dysuria, or frequent urination.  VASCULAR:  No claudication.  He has had pain in his legs with walking.  No TIAs or DVTs.  NEUROLOGICAL:  No dizziness, headaches, blackouts, seizures.  MUSCULOSKELETAL:  No joint pain.  PSYCHIATRIC:  Depression.  EYE/ENT:  Decreased in his eyesight and macular repair on the right  side.  HEMATOLOGICAL:  He has problems with iron-deficiency anemia.  No  bleeding or clotting disorders.   PHYSICAL EXAMINATION:  Vital Signs:  His blood pressure is 158/70, pulse  86, respirations 18, sats were 94%.  Head, eyes, ears, nose, and throat:  Unremarkable.  Neck:  Supple without thyromegaly.  There is no  supraclavicular or axillary adenopathy.  Chest:  Clear to auscultation  and percussion.  Heart:  Regular sinus rhythm.  No murmurs.  Abdomen:  Soft.  There is a PEG in place.  Extremities:  Pulses are 1+.  There is  no clubbing or edema.  Neurological:  He is oriented x3.  Sensory and  motor intact.   I discussed situation with the patient.  It is unusual that this new  lung lesions occurred within 2 months.  It could be a cancer but it  could also be some type of infectious process.  I will go ahead and get  a CT scan of him and then see him back again next week.  We also get  some pulmonary function test.  His family agreed to this plan.   I appreciate the opportunity of seeing the patient.  Ines Bloomer, M.D.  Electronically Signed   DPB/MEDQ  D:  10/31/2009  T:  11/01/2009  Job:  [id number]   cc:   Kalman Shan, MD

## 2011-01-19 NOTE — Letter (Signed)
November 11, 2009   Osvaldo Shipper. Spruill, MD  [address] [address]  [place], [place] [address]   Re:  [name], [name]               DOB:  03-27-1919   Dear Dorene Sorrow:   I saw the patient back today after his PET scan and the PET scan showed  no evidence of recurrence of uptake.  I am looking at this and how this  has come up.  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  I think this would explain why it  was not there and then suddenly is formed.  This looks like a  pseudotumor and has the enhancing qualities of pseudotumor and the  radiologists now think that may be what it is.  Whatever the case is, I  will continue to follow him.  I will see him back in 2 months with a  chest x-ray.   Ines Bloomer, M.D.  Electronically Signed   DPB/MEDQ  D:  11/11/2009  T:  11/12/2009  Job:  [id number]   cc:   Kalman Shan, MD

## 2011-01-19 NOTE — Op Note (Signed)
NAME:  [name], [name]               ACCOUNT NO.:  [id number]   MEDICAL RECORD NO.:  [id number]          PATIENT TYPE:  AMB   LOCATION:  ENDO                         FACILITY:  [facility]   PHYSICIAN:  Anselmo Rod, M.D.  DATE OF BIRTH:  25-Jan-1919   DATE OF PROCEDURE:  05/29/2007  DATE OF DISCHARGE:                               OPERATIVE REPORT   PROCEDURE PERFORMED:  Esophagogastroduodenoscopy with brushings of the  proximal esophagus.   ENDOSCOPIST:  Anselmo Rod, MD.   INSTRUMENT USED:  Pentax video panendoscope.   INDICATIONS FOR PROCEDURE:  An 77 year old, African-American male with a  history of esophageal stricture, chronic reflux and hiccups and a  history of iron-deficiency anemia undergoing an EGD for possible  dilatation.   PREPROCEDURE PREPARATION:  Informed consent was procured from the  patient.  The patient was fasted for 8 hours prior to the procedure. The  risks and benefits of the procedure were discussed with the patient in  great detail.   PREPROCEDURE PHYSICAL:  The patient had stable vital signs.  Neck  supple. Chest clear to auscultation.  S1, S2 regular.  Abdomen soft with  normal bowel sounds.   DESCRIPTION OF PROCEDURE:  The patient was placed in the left lateral  decubitus position and sedated with 50 mcg of Fentanyl and 2.5 mg of  Versed given intravenously in slow incremental doses. Once the patient  was adequately sedated and maintained on low-flow oxygen and continuous  cardiac monitoring, the Pentax video panendoscope was advanced through  the mouthpiece over the tongue into the esophagus under direct vision.  The vocal cords appeared healthy. There was  whitish exudate throughout  the esophageal mucosa including the mid and proximal esophagus. More  scant exudates were noted in the distal esophagus.  Brushings were done  to confirm the diagnosis of candidiasis. There was slight narrowing at  the GE junction and mild bleeding with passage of  scope into the  stomach. The proximal small bowel appeared normal. There was some  hesitation in passing the scope from the pylorus into the proximal small  bowel. However, no duodenitis was noted as during previous endoscopies.  Retroflexion in the high cardia revealed no abnormalities except for  some fresh bleeding noted as mentioned above.  No ulcers, erosions,  masses or polyps were identified. No dilatation was done. The patient  tolerated the procedure well without immediate complications. He had  some desaturation initially but did well once his saturations were  normalized with a face mask.   IMPRESSION:  1. Normal-appearing vocal cord.  2. Large number of exudates in the proximal and midesophagus,      ?candidiasis, brushings done.  3. Distal esophageal stricture gently dilated with passage of scope.  4. Mild antral gastritis.  5. Normal proximal small bowel.   RECOMMENDATIONS:  1. Diflucan 200 mg p.o. to start with followed by 100 mg every day for      the next 10 days has been advised. This will be called in to his      pharmacy.  2. He is to continue Nexium suspension  as before.  3. Outpatient follow-up in the next 4 weeks for further      recommendations. Avoidance of all nonsteroidals have been      encouraged for now.      Anselmo Rod, M.D.  Electronically Signed     JNM/MEDQ  D:  05/29/2007  T:  05/29/2007  Job:  [id number]   cc:   Osvaldo Shipper. Spruill, M.D.  Fax: [phone]

## 2011-01-22 NOTE — Op Note (Signed)
NAME:  [name], [name]                         ACCOUNT NO.:  [id number]   MEDICAL RECORD NO.:  [id number]                   PATIENT TYPE:  AMB   LOCATION:  ENDO                                 FACILITY:  [facility]   PHYSICIAN:  Anselmo Rod, M.D.               DATE OF BIRTH:  1918/09/11   DATE OF PROCEDURE:  05/15/2003  DATE OF DISCHARGE:                                 OPERATIVE REPORT   PROCEDURE PERFORMED:  Esophagogastroduodenoscopy.   ENDOSCOPIST:  Charna Elizabeth, M.D.   INSTRUMENT USED:  Olympus video panendoscope.   INDICATIONS FOR PROCEDURE:  Dysphagia with a history of reflux esophagitis  in an 77 year old African-American male, noncompliant with proton pump  inhibitors.  Rule out recurrent stricture.  The patient has had worsening  dysphagia in the recent past.   PREPROCEDURE PREPARATION:  Informed consent was procured from the patient.  The patient was fasted for eight hours prior to the procedure.   PREPROCEDURE PHYSICAL:  The patient had stable vital signs.  Neck supple,  chest clear to auscultation.  S1, S2 regular.  Abdomen soft with normal  bowel sounds.   DESCRIPTION OF PROCEDURE:  The patient was placed in the left lateral  decubitus position and sedated with 50 mg of Demerol and 4 mg of Versed  intravenously.  Once the patient was adequately sedated and maintained on  low-flow oxygen and continuous cardiac monitoring, the Olympus video  panendoscope was advanced through the mouth piece over the tongue into the  esophagus under direct vision.  The entire esophagus appeared normal.  There  was a stricture noted in the distal esophagus which seemed to dilate  spontaneously with the passage of the scope. There was minimal bleeding  noticed.  Retroflexion in the high cardia revealed a small hiatal hernia,  mild antral gastritis and duodenitis in the bulb was noted as well.  There  was no evidence of ulcer disease, masses or polyps.  The patient tolerated  the  procedure well without complications.  Dilatation was not done as  planned.   IMPRESSION:  1. Distal esophagitis with stricture.  Spontaneous dilatation achieved with     passage of the scope.  2. Mild antral gastritis.  3. Duodenitis in the duodenal bulb.  4. Small hiatal hernia.   RECOMMENDATIONS:  1. Trial of Prevacid SoluTabs.  2.     Follow antireflux measures.  3. Liberal fluid intake with soft meals.  4. Outpatient follow-up in the next two weeks or earlier if need be.                                               Anselmo Rod, M.D.    JNM/MEDQ  D:  05/15/2003  T:  05/15/2003  Job:  [id number]   cc:  Osvaldo Shipper. Spruill, M.D.  [address] [address]  [place]  [place] [address]  Fax: [phone]

## 2011-01-22 NOTE — Procedures (Signed)
[facility]  Patient:    [name], [name] Visit Number: [id number] MRN: [id number]          Service Type: END Location: ENDO Attending Physician:  Charna Elizabeth Dictated by:   Anselmo Rod, M.D. Proc. Date: 11/21/01 Admit Date:  11/21/2001   CC:         Osvaldo Shipper. Spruill, M.D.   Procedure Report  DATE OF BIRTH:  June 11, 1919  REFERRING PHYSICIAN:  Osvaldo Shipper. Spruill, M.D.  PROCEDURE PERFORMED:  Esophagogastroduodenoscopy.  ENDOSCOPIST:  Anselmo Rod, M.D.  INSTRUMENT USED:  Olympus video panendoscope.  INDICATIONS FOR PROCEDURE:  Dysphagia in an 77 year old African-American male who has had a history of esophageal stricture dilated on several occasions in the past.  PREPROCEDURE PREPARATION:  Informed consent was procured from the patient. The patient was fasted for eight hours prior to the procedure.  PREPROCEDURE PHYSICAL:  The patient had stable vital signs.  Neck supple. Chest clear to auscultation.  S1, S2 regular.  Abdomen soft with normal abdominal bowel sounds.  DESCRIPTION OF PROCEDURE:  The patient was placed in left lateral decubitus position and sedated with 50 mg of Demerol and 5 mg of Versed intravenously. Once the patient was adequately sedated and maintained on low-flow oxygen and continuous cardiac monitoring, the Olympus video panendoscope was advanced through the mouthpiece, over the tongue, into the esophagus under direct vision.  There was a stricture seen in the distal esophagus with severe esophagitis around the stricture.  The proximal esophagus appeared normal. The rest of the gastric mucosa appeared normal as well.  There was mild duodenitis in the duodenal bulb.  Considering the extent of the esophagitis around the esophageal stricture, dilatation was not done.  IMPRESSION: 1. Severe esophagitis with stricturing in the distal esophagus. 2. Normal-appearing stomach. 3. Mild duodenitis in the duodenal  bulb, otherwise normal proximal small    bowel.  RECOMMENDATION: 1. Carafate slurry 1 gm q.i.d. has been prescribed for the next 30 days    along with Nexium 40 mg b.i.d. 2. Outpatient follow-up in the next two weeks. 3. Esophagogastroduodenoscopy with dilatation will be planned in the next    four to six weeks after the patient has been maintained on PPIs and    Carafate to help with healing of the esophagitis. Dictated by:   Anselmo Rod, M.D. Attending Physician:  Charna Elizabeth DD:  11/21/01 TD:  11/21/01 Job: [id number]

## 2011-01-22 NOTE — Op Note (Signed)
NAME:  [name], [name]               ACCOUNT NO.:  [id number]   MEDICAL RECORD NO.:  [id number]          PATIENT TYPE:  AMB   LOCATION:  ENDO                         FACILITY:  [facility]   PHYSICIAN:  Anselmo Rod, M.D.  DATE OF BIRTH:  March 19, 1919   DATE OF PROCEDURE:  02/14/2006  DATE OF DISCHARGE:                                 OPERATIVE REPORT   PROCEDURE PERFORMED:  Esophagogastroduodenoscopy with dilatation of apparent  esophageal stricture.   ENDOSCOPIST:  Anselmo Rod, M.D.   INSTRUMENT USED:  Olympus video pan-endoscope.   INDICATIONS FOR PROCEDURE:  An 77 year old African-American male with a  history of peptic esophagitis and stricture undergoing a repeat EGD for  recurrent dysphagia. The patient has had problems with hiccups and  difficulty swallowing solids for several weeks now. His last dilatation was  in August of last year by Dr. Elnoria Howard.  The patient has a history of iron  deficiency anemia and is on iron supplements at this time.   BRIEF HISTORY OF THE PREPARATION:  Informed consent was procured from the  patient. The patient was fasted for 8 hours prior to the procedure.  Risks  and benefits of the procedure were discussed with the patient in great  detail.  The risks including bleeding, perforation requiring surgery, et  Karie Soda were elaborated with the patient.  The patient seemed to have a  complete understanding of this, as he has had the procedures done on several  occasions in the past.   PRE-PROCEDURE PHYSICAL:  The patient has stable vital signs.  CARDIOPULMONARY: Clear to auscultation.  S1 and S2 regular.  ABDOMEN: Soft with normoactive bowel sounds.   DESCRIPTION OF PROCEDURE:  The patient was placed in the left lateral  decubitus position and sedated with 60 mcg of fentanyl and 6 mg of Versed  administered in incremental doses.  Once the patient was adequately sedated  and maintained on low-flow oxygen and continuous cardiac monitoring, the  Olympus pan-endoscope was advanced with the mouth piece over the tongue into  the esophagus under direct vision.  The proximal esophagus appeared normal,  but there was significant narrowing in the distal esophagus.  This had a  tapered stricture, and the adult Olympus pan-endoscope was gently advanced  with slight pressure into the stomach.  Retroflexion of the high curve  revealed a small hiatal hernia.  Mild gastritis was noted diffusely.  The  proximal small bowel appeared normal.  Once the upper GI tract was closely  visualized, the Olympus pan-endoscope was withdrawn after placing guidewire  for Savary dilatation in a routine manner.  The esophagus was then dilated  with serial dilators, 12.8-mm and 14-mm Savary dilators.  The patient  tolerated this well.  Repeat endoscopy was done to check the stricture,  which was now widely patent.  There was some heme on the last dilator.  Once  this was completed, an adult colonoscope was passed to the stricture and  held there for about 2 minutes to achieve a more permanent effective  dilatation.  The patient tolerated the procedure well without complications.  Small bowel  biopsies were done to rule out sprue, as he also has a history  of iron deficiency anemia.   RECOMMENDATIONS:  1.Patient has been advised to change his PPI from Nexium  to Prevacid Solutab, and samples have been given to him from the office for  the next 2 weeks.  He is to take 30 mg 15-20 minutes before breakfast and 30  mg before dinner.  In addition, Carafate slurry 1 gram q.i.d. has been  called into his pharmacy to be  taken in between meals and at bedtime.  2.Outpatient followup in the next 2 weeks. We will repeat CBC and close  followup.  3.A telephone update will be done to keep a close watch on the patient's  symptoms.      Anselmo Rod, M.D.  Electronically Signed     JNM/MEDQ  D:  02/15/2006  T:  02/15/2006  Job:  [id number]   cc:   Osvaldo Shipper. Spruill,  M.D.  Fax: [phone]

## 2011-01-22 NOTE — Op Note (Signed)
NAME:  [name], [name]               ACCOUNT NO.:  [id number]   MEDICAL RECORD NO.:  [id number]          PATIENT TYPE:  AMB   LOCATION:  ENDO                         FACILITY:  [facility]   PHYSICIAN:  Jordan Hawks. Elnoria Howard, MD    DATE OF BIRTH:  08/01/1919   DATE OF PROCEDURE:  08/04/2004  DATE OF DISCHARGE:                                 OPERATIVE REPORT   PROCEDURE PERFORMED:  Esophagogastroduodenoscopy.   ENDOSCOPIST:  Jordan Hawks. Elnoria Howard, MD   INSTRUMENT USED:  Olympus adult endoscope.   INDICATIONS FOR PROCEDURE:  For dysphagia.   REFERRING PHYSICIAN:  Osvaldo Shipper. Spruill, M.D.   PHYSICAL EXAMINATION:  LUNGS:  Clear to auscultation bilaterally.  CARDIOVASCULAR:  Regular rate and rhythm.  ABDOMEN:  Flat, soft, nontender, nondistended.   MEDICATIONS:  Fentanyl 5 mcg IV, Versed 40 mg IV and Cetacaine spray.   CONSENT:  Informed consent was obtained from the patient, stating the risks  of bleeding, infection, perforation, medication reactions, and the risk of  death, all of which are not exclusive of any other complications that can  occur.  It was specifically emphasized to the patient that the patient is at  high risk for perforation if dilatations are performed.   DESCRIPTION OF PROCEDURE:  After adequate sedation, the endoscope was  advanced from the oral cavity to the duodenum under direct visualization  without difficulty.  There were no abnormalities detected in the duodenum.  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  Retroflexion  revealed a large hiatal hernia and also revealed some linear erosions  consistent with a Sheria Lang type of lesion.  There was no evidence of any  masses in the cardia region.  The endoscope was then straightened and  withdrawn into the esophagus and the patient is documented to have a large  hiatal hernia extending from 40 to 45 cm from the incisors.  Just proximal  to  this area, the patient was noted to have a very minimal esophagitis and a  strictured region which was able to be traversed with the endoscope with  mild resistance.  Because of the patient's longstanding history of dysphagia  and a recent esophagram revealing a possibility of a strictured region, the  decision was made to perform a dilatation.  The endoscope was then inserted  into the stomach and the guidewire was inserted through the endoscope and  exchanged out from the patient while the visual confirmation of the  guidewire in the gastric lumen was obtained.  A 10 cm Savary dilator was  employed and this was passed without any resistance and no evidence of heme.  A 12 cm was then used again.  It was passed without resistance and no  evidence of any heme.  A 14 cm dilator was then used and the patient was  noted to have very minimal heme on the dilator upon retraction again, no  resistance.  A 15 cm and 16 cm did not demonstrate any resistance; however,  both did show increasing amount of heme with  the dilatation. A repeat  endoscopic visualization of the dilated area was performed and there was  mucosal trauma that was confirmed.  The endoscope was then withdrawn and the  patient was re-examined in regard to crepitus in the neck region, which he  was negative.  The patient tolerated the procedure well.  No complications  were encountered.   PLAN:  1.  Continue with Nexium twice daily.  2.  Follow up in clinic in two weeks' time.  Depending on his clinical      status, the patient may repeat dilatation in the near future.       PDH/MEDQ  D:  08/04/2004  T:  08/04/2004  Job:  [id number]   cc:   Osvaldo Shipper. Spruill, M.D.  [address] [id number]  [place]  [place] [address]  Fax: [phone]

## 2011-01-22 NOTE — Op Note (Signed)
NAME:  Marc, MAZZOCCO                         ACCOUNT NO.:  [id number]   MEDICAL RECORD NO.:  [id number]                   PATIENT TYPE:  AMB   LOCATION:  ENDO                                 FACILITY:  [facility]   PHYSICIAN:  Anselmo Rod, M.D.               DATE OF BIRTH:  Nov 28, 1918   DATE OF PROCEDURE:  12/16/2003  DATE OF DISCHARGE:                                 OPERATIVE REPORT   PROCEDURE PERFORMED:  Esophagogastroduodenoscopy with injection of Botox to  distal esophagus.Marland Kitchen   ENDOSCOPIST:  Charna Elizabeth, M.D.   INSTRUMENT USED:  Olympus video panendoscope.   INDICATIONS FOR PROCEDURE:  The patient is an 77 year old African-American  male with a history of abdominal aortic aneurysm status post aortoiliac  graft  and reimplantation of IMA, undergoing an esophagogastroduodenoscopy  for achalasia and dysphagia.  The patient has also had abnormal weight loss  because of his inability to even swallow.   PREPROCEDURE PHYSICAL:  The patient had stable vital signs.  Neck supple,  chest clear to auscultation.  S1, S2 regular.  Abdomen soft with a well  healed surgical scar in the midline.   DESCRIPTION OF PROCEDURE:  The patient was placed in the left lateral  decubitus position and sedated with 60 mg of Demerol and 6 mg of Versed  intravenously.  Once the patient was adequately sedated and maintained on  low-flow oxygen and continuous cardiac monitoring, the Olympus video  panendoscope was advanced through the mouth piece over the tongue into the  esophagus under direct vision.  The esophagus seemed large and dilated and  distal narrowing.  There was some distal esophagitis noted.  25 units of  Botox injected in the four quadrants about the Z-line.  A 3 to 4 cm hiatal  hernia was noted.  The was duodenitis in the duodenal bulb and retroflexion  in the high cardia revealed no evidence of abnormality except for the hiatal  hernia mentioned above.  The patient tolerated the procedure  well without  immediate complications.   IMPRESSION:  1. Achalasia with peptic stricture and distal esophagitis.  2. 3 to 4 cm hiatal hernia.  3. Duodenitis in the duodenal bulb.   RECOMMENDATIONS:  1. Continue Prevacid Solutab twice daily.  2. Soft diet for now.  3. Outpatient followup in the next two weeks.  If the patient's symptoms are     not improved, he may require temporary PEG placement until he is able to     undergo Heller's myotomy in the future.  I have discussed this case with     Adolph Pollack, M.D. at length and he agrees                                               [facility]  Loreta Ave, M.D.    JNM/MEDQ  D:  12/16/2003  T:  12/16/2003  Job:  [id number]   cc:   Osvaldo Shipper. Spruill, M.D.  [address] [id number]  [place]  [place] [address]  Fax: [phone]   P. Liliane Bade, M.D.  [address]  [place]  [place] [address]   Adolph Pollack, M.D.  [address]  [place]  [place] [address]  Fax: [phone]

## 2011-01-22 NOTE — Discharge Summary (Signed)
[name], [name]               ACCOUNT NO.:  [id number]   MEDICAL RECORD NO.:  [id number]          PATIENT TYPE:  INP   LOCATION:  3705                         FACILITY:  [facility]   PHYSICIAN:  Osvaldo Shipper. Spruill, M.D.DATE OF BIRTH:  May 04, 1919   DATE OF ADMISSION:  10/07/2007  DATE OF DISCHARGE:  10/18/2007                               DISCHARGE SUMMARY   DISCHARGE DIAGNOSES:  1. Syncope and collapse.  2. Malnutrition with body mass index less than 19.  3. Esophagitis.  4. Subendocardial infarction and subsequent care.  5. Hypertension.  6. Esophageal reflux.  7. Atrial fibrillation.  8. Hiccups.   CONSULTANTS:  Dr. Charna Elizabeth.   PROCEDURE:  Esophagogastroduodenoscopy with closed biopsy on October 11, 2007.   [name] is an 77 year old patient who presented to the emergency  department of the [facility] by ambulance on October 07, 2007.  The patient sustained an inferior wall myocardial infarction on September 11, 2007.  He presented on October 08, 2007 with complaint of near  syncope.  The patient has a history of chronic hiccups and tries self  indulged vomiting to stop the hiccups.  He thinks he nearly passed out  after one of these episodes.  The patient was noted by EMS personnel to  have atrial fibrillation with hypotension.  He was evaluated in the  emergency department.  His history and physical was completed by Dr.  Donia Guiles.  The patient was noted to have orthostatic blood  pressure changes in the emergency department.  His lying blood pressure  was 107/52 with a pulse of 97.  His sitting was 109/49 with a pulse of  97.  His standing, however, was 88/52 with pulse of 103.  On his  examination his cardiovascular exam revealed a normal S1-S2, no S3 and  no rubs.  His abdomen was soft with good bowel sounds.  His neurologic  examination revealed no acute deficits at this point.   The patient was treated with normal saline bolus followed by  maintenance  fluids which the patient responded to quite nicely.  He had cardiac  markers done which were negative for acute injury at this time.  It was  the opinion, however, given the patient's age, some mild anemia that was  noted in the workup and the atrial fibrillation with hypotension noted  by the EMS personnel, the patient should be admitted for additional  evaluation and monitoring.   HOSPITAL COURSE:  The patient was admitted to the medical service.  Was  placed on telemetry.  His IV fluids were continued.  The patient was  seen by pharmacy for Lovenox protocol for DVT prophylaxis.   The patient continued have problems with hiccups.  This was treated with  IM Thorazine and at times, Thorazine elixir.   On October 09, 2007 the patient was seen by GI medicine,  Dr. Loreta Ave.  The  patient underwent an EGD which revealed a rather severe distal  esophagitis with inflammation in the mid to proximal esophagus.  The  patient was also noted to have an esophageal stricture present.  It was  recommended that the patient have a PICC line and TPN be started as well  as IV PPI's.   On October 10, 2007 the patient had a PICC line placed by invasive  radiology.  Total nutritional assistance was formulated after IV PPI's  and antacid  therapy as well as TNA the patient was able to swallow some  food on October 17, 2007.   The patient was also seen by physical therapy to assist his strength and  ability to walk.   The patient continued to show steady improvement and on October 18, 2007 it was the attending physician as well as the consulting  physician's opinion that the patient had received maximum benefit from  this hospitalization and could be discharged home.   ADDENDUM:  The electrocardiogram revealed a normal sinus rhythm with  right bundle branch block.  There were no significant changes in the EKG  from January of 2009.   MEDICATIONS AT DISCHARGE:  1. Xanax 0.25 one p.o.  three times daily as needed.  2. Elavil 25 mg three times a day.  3. Reglan 10 mg 1/2 hour a.c. and h.s.  4. Protonix 40 mg daily.   The patient is to be seen in the office in 2 weeks.  Plans will also be  made for outpatient evaluation by GI.  The patient is to notify the  physician immediately if any changes, problems or concerns.      [name], P.A.      Osvaldo Shipper. Spruill, M.D.  Electronically Signed    HB/MEDQ  D:  11/08/2007  T:  11/09/2007  Job:  [id number]

## 2011-01-22 NOTE — Procedures (Signed)
[facility]  Patient:    [name], [name]                      MRN: [id number] Proc. Date: 06/07/00 Adm. Date:  [id number] Attending:  Charna Elizabeth                           Procedure Report  PROCEDURE PERFORMED:  Colonoscopy.  ENDOSCOPIST:  Anselmo Rod, M.D.  INSTRUMENT USED:  Olympus video colonoscope.  INDICATION FOR PROCEDURE:  A screening colonoscopy being performed on this 77 year old black male to rule out polyps, masses, hemorrhoids, etc.  PREPROCEDURE PREPARATION:  Informed consent was procured from the patient. The patient was fasted for eight hours prior to the procedure and prepped with a bottle of magnesium citrate and a gallon of NuLytely on the night prior to the procedure.  PREPROCEDURE PHYSICAL:  VITAL SIGNS:  The patient had stable vital signs.  NECK:  Supple.  LUNGS:  Chest was clear to auscultation.  CARDIAC:  S1, S2 is regular.  ABDOMEN:  Soft with normal abdominal bowel sounds.  DESCRIPTION OF PROCEDURE:  The patient was placed in the left lateral decubitus position.  He was sedated with 50 mg Demerol and 4 mg of Versed intravenously.  Once the patient was adequately sedated and maintained on low-flow oxygen and continuous cardiac monitoring, the Olympus video colonoscope was advanced from the rectum to cecum without difficulty.  Except for a few scattered diverticulum in the left colon, no other abnormalities were seen throughout.  The entire cecum, rectal and transverse colon, as well as proximal left colon and the terminal ileum appeared normal.  IMPRESSION:  Normal colonoscopy examination, except for a few scattered diverticulum in the left colon.  RECOMMENDATIONS: 1. Increase fluid and fiber in diet. 2. Colorectal cancer screening in the next five to ten years; or earlier    if need be. 3. Outpatient follow-up on a p.r.n. basis. DD:  06/07/00 TD:  06/07/00 Job: [id number]

## 2011-01-22 NOTE — Discharge Summary (Signed)
NAME:  [name], [name]                         ACCOUNT NO.:  [id number]   MEDICAL RECORD NO.:  [id number]                   PATIENT TYPE:  INP   LOCATION:  2040                                 FACILITY:  [facility]   PHYSICIAN:  Balinda Quails, M.D.                 DATE OF BIRTH:  [birth year]   DATE OF ADMISSION:  11/20/2003  DATE OF DISCHARGE:  11/28/2003                                 DISCHARGE SUMMARY   ADMIT DIAGNOSES:  1. Abdominal aortic aneurysm.  2. Right common iliac artery aneurysm.  3. Left inguinal hernia.   PAST MEDICAL HISTORY:  1. A 4 cm infra-renal abdominal aortic aneurysm.  2. 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  3. Left inguinal hernia.  4. Hypertension.  5. Chronic hiccups with questionable relationship to gastroesophageal reflux     disease.  6. Chronic back pain.  7. History of anemia.  8. History of right eye cataract.   PAST SURGICAL HISTORY:  1. Hemorrhoidectomy.  2. Tonsillectomy.  3. Cataract surgery for removal of right cataract.  4. History of upper endoscopy.   ALLERGIES:  NO KNOWN DRUG ALLERGIES, although he reports a hot sensation  after contrast dye injection.   DISCHARGE DIAGNOSES:  1. Abdominal aortic aneurysm and right common iliac artery aneurysm, status     post resection and grafting of abdominal aortic aneurysm and aortobi-     iliac bypass graft.  2. Left inguinal hernia, status post left inguinal herniorrhaphy with mesh.   BRIEF HISTORY:  This is an 77 year old African-American male who was  referred to Dr. Madilyn Fireman by Dr. Mills Koller for evaluation of an abdominal  aortic aneurysm and right common iliac artery aneurysm.  Apparently during a  physical exam the patient was noted to have abdominal bruits with pulsatile  mass.  An abdominal CT was subsequently ordered which showed a bi-lobe  saccular aneurysm in the distal abdominal aorta and in the right common  iliac artery.  In order to further delineate the anatomy, Dr.  Madilyn Fireman  performed an abdominal aortogram with pelvic runoff arteriography on September 27, 2003.  Findings were consistent with a large, saccular, terminal aortic  aneurysm and saccular aneurysm of the right common iliac artery.  Initially  it was felt that the patient may be a candidate for stent graft repair of  the aneurysm; however, after further review, Dr. Madilyn Fireman felt that an open  repair and grafting would be a better option.  Additionally, the patient  also had a known left inguinal hernia with anticipated repair by Dr. Lurene Shadow.  It was felt that he could undergo open repair and grafting of his aneurysm  and right common iliac aneurysm at the same time and Dr. Lurene Shadow could  perform the left inguinal hernia repair.  Preoperative testing performed on  March 14 showed no significant evidence of bilateral lower extremity  arterial occlusive disease with  ABIs greater than 1.0 bilaterally.  Carotid  duplex result also showed no significant internal or external carotid artery  disease.  The patient was cleared by Dr. Elease Hashimoto on October 28, 2003, after  having a normal adenosine Cardiolite which showed an ejection fraction of  58%.   HOSPITAL COURSE:  The patient was admitted and taken to the OR on November 20, 2003, for a left inguinal herniorrhaphy with mesh by Dr. Lurene Shadow, as well as  resection and grafting of abdominal aortic aneurysm with an aortobi-iliac  bypass graft with a 16 x 8 mm Hemashield graft.  The patient tolerated the  procedure well and was hemodynamically stable immediately postoperatively.  The patient was extubated without complication ___________ seizure,  neurologically intact.  The patient was transferred from the OR to the FFU  in stable condition.  After surgery, the patient's blood pressure was labile  and he required volume as well as packed RBCs.  The patient's coags were  corrected.  Postoperative day 1 the patient was off of dopamine.  His blood  pressure was  150/75 with a heart rate of 75 and respirations of 10.  The  patient's chest was clear and the abdomen was soft.  There were no bowel  sounds present at that time.  There were 2+ dorsalis pedis pulses present  bilaterally.  The patient's hemoglobin and hematocrit was 8.7 and 26 at that  time.  The patient's magnesium was noted at 1.6 and this was replaced.  The  patient would withdraw all extremities; however, he would not follow  commands.  His neuro status was followed closely.  The patient's IV fluids  were decreased.  The patient's saline was discontinued on postoperative day  2.  His hemoglobin and hematocrit were noted to be 10 and 30.  The patient  was transferred to unit 2000 on postoperative day 5 without complication.  The patient's hospital course has progressed as expected.  His diet was  advanced on postoperative day 5 as well to mechanical soft.  On  postoperative day 6 the patient was complaining of dysuria.  A UA and urine  culture were collected at the time.  The urinalysis revealed glycosuria and  hematuria and it was otherwise negative.  The patient has no history of  diabetes mellitus and all of the glucose levels on his BMPs have been within  normal limits.  This was, therefore, monitored.  The urine culture is still  pending at the time of this dictation.  The patient's diet was advanced to  regular food on postoperative day 7 and he was tolerating this well.  He was  without complaint and there was no nausea or vomiting.  The patient was  having bowel movements and passing flatus.  The patient was afebrile and the  vital signs were stable with a blood pressure of 106/58 and a heart rate of  87, O2 saturation of 93% on room air.   PHYSICAL EXAMINATION:  CARDIAC:  Regular rate and rhythm.  LUNGS:  Crackles present in the right base.  ABDOMEN:  Soft, slightly tender, nondistended.  There were positive bowel  sounds.  The incisions were healing well. EXTREMITIES:  No  edema.  There were 2+ dorsalis pedis pulses present  bilaterally.   The patient has continued to progress as expected.  He is in stable  condition and is felt to be ready for discharge within the next 1-2 days.   LABS:  CBC and BMP on November 27, 2003; white count 10.7, hemoglobin 10,  hematocrit 29.9, platelets 335, sodium 140, potassium 3.9, BUN 16,  creatinine 1.0, glucose 99.   CONDITION ON DISCHARGE:  Improved.   INSTRUCTIONS:  The patient is to resume his home medications which include:  1. Lotrel 10 or 20 mg q.d.  2. Prevacid 30 mg two tabs p.o. q.d.  3. Amitriptyline 25 mg p.r.n. muscle spasms.  4. The patient was given a prescription for Toprol XL 50 mg p.o. q.d.  5. Colace 100 mg two tabs p.o. q.d.  6. Pain management; Tylox 1-2 p.o. q.4-6h. p.r.n. pain.   ACTIVITY:  No driving, no strenuous activity.  The patient is to continue  daily bathing and walking, exercise.   DIET:  Low salt, fat, and cholesterol.   WOUND CARE:  The patient is to shower daily and clean the incisions with  soap and water.   INSTRUCTIONS:  If the incision becomes red, swollen, or drains of if the  patient has a fever of 101 degrees Fahrenheit, he is to call the [facility]  office.   FOLLOW-UP APPOINTMENTS:  1. Staple removal at [facility] on December 06, 2003, at 9:30 a.m.  2. Dr. Lurene Shadow.  The patient is to call him for an appointment in two to     three weeks after discharge at [phone].  3. Dr. Madilyn Fireman.  December 23, 2003, at 2:30 p.m.  ABIs will be performed at that     time.      Pecola Leisure, [place]                      Balinda Quails, M.D.    AY/MEDQ  D:  11/27/2003  T:  11/29/2003  Job:  [id number]   cc:   Balinda Quails, M.D.  [address]  [place]  [place] [address]   Leonie Man, M.D.  [address]  [address]  [place]  [place] [address]  Fax: [phone]

## 2011-01-22 NOTE — Op Note (Signed)
NAME:  [name], [name]                         ACCOUNT NO.:  [id number]   MEDICAL RECORD NO.:  [id number]                   PATIENT TYPE:  OIB   LOCATION:  2899                                 FACILITY:  [facility]   PHYSICIAN:  Balinda Quails, M.D.                 DATE OF BIRTH:  1919-02-20   DATE OF PROCEDURE:  09/27/2003  DATE OF DISCHARGE:  09/27/2003                                 OPERATIVE REPORT   SURGEON:  Balinda Quails, M.D.   PREOPERATIVE DIAGNOSIS:  Saccular abdominal aortic aneurysm and right common  iliac artery aneurysm.   POSTOPERATIVE DIAGNOSIS:  Saccular abdominal aortic aneurysm and right  common iliac artery aneurysm.   PROCEDURE:  Abdominal aortogram with pelvic run off arteriography.   ACCESS:  Right common femoral artery 5 French sheath.   CONTRAST:  110 mL Visipaque.   COMPLICATIONS:  None apparent.   CLINICAL NOTE:  [name] is an 77 year old male who recently underwent CT  scan revealing a terminal aortic saccular aneurysm and a saccular aneurysm  of the right common iliac artery.  He is brought to the cath lab at this  time for diagnostic arteriography.   OPERATIVE PROCEDURE:  The patient was brought to the cath lab in stable  condition.  He was placed in the supine position.  Both groins were prepped  and draped in a sterile fashion.  The skin and subcutaneous tissue in the  right groin was instilled with 1% Xylocaine.  The needle was easily  introduced into the anterior wall of the right common femoral artery.  A  0.035 J-wire was passed through the needle into the mid abdominal aorta.  A  5 French sheath was advanced over the guide-wire, the sheath was flushed  with heparin saline solution.  A graduated pigtail catheter was advanced  over the guide-wire to the suprarenal aorta.  AP abdominal aortogram  obtained.  This revealed single, widely patent bilateral renal arteries.  The aorta was normal in caliber down to its termination where there was a  large saccular aneurysm arising to the right of the aorta with some  compression.  Also, a large saccular aneurysm of the right common iliac  artery.  The left common iliac artery was relatively normal in caliber.  A  lateral aortogram was obtained.  The superior mesenteric artery was widely  patent.  The pigtail catheter was brought down to the aortic bifurcation and  AP and bilateral oblique pelvic arteriograms were obtained.  The saccular  aneurysm arose from the terminal aorta.  The right common iliac artery also  revealed a saccular aneurysm.  The left common iliac, hypogastric, and  external iliacs were widely patent.  The right external iliac and internal  iliac arteries were also widely patent.  Normal flow to the common femoral  level.  Guide-wire was reinserted, pigtail catheter removed.  The right  femoral sheath removed.  No apparent complications.  Total contrast 110 mL  Visipaque.   FINAL IMPRESSION:  1. Large saccular terminal aortic aneurysm.  2. Saccular aneurysm of the right common iliac artery.   DISPOSITION:  These results will be reviewed with the patient and family.  The patient appears to be an adequate candidate for placement of an aortic  stent graft.                                               Balinda Quails, M.D.    PGH/MEDQ  D:  09/27/2003  T:  09/27/2003  Job:  [id number]   cc:   Jake Shark A. Tanda Rockers, M.D.  [address]  [place]  [place] [address]  Fax: [phone]

## 2011-01-22 NOTE — Op Note (Signed)
NAME:  [name], [name]               ACCOUNT NO.:  [id number]   MEDICAL RECORD NO.:  [id number]          PATIENT TYPE:  AMB   LOCATION:  ENDO                         FACILITY:  [facility]   PHYSICIAN:  Anselmo Rod, M.D.  DATE OF BIRTH:  October 24, 1918   DATE OF PROCEDURE:  07/25/2006  DATE OF DISCHARGE:                                 OPERATIVE REPORT   PROCEDURE PERFORMED:  Esophagogastroduodenoscopy with Botox injection and  dilatation of the distal esophageal stricture.   ENDOSCOPIST:  Anselmo Rod, M.D.   INSTRUMENT USED:  Olympus video panendoscope.   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  Abnormal weight loss has  been issue for the patient as his swallowing has been considerably  disturbed.   PROCEDURE PREPARATION:  Informed consent was procured from the patient.  The  patient fasted for 8 hours prior to the procedure.  The risks and benefits  of the procedure were discussed with him in great detail.   PREPROCEDURE DIAGNOSIS:  The patient had stable vital signs.  Neck was  supple.  Chest clear to auscultation.  S1, S2 regular. Abdomen soft with  normal bowel sounds.   DESCRIPTION OF PROCEDURE:  The patient was placed in the left lateral  decubitus position and sedated with 20 mcg of fentanyl and 2.5 mg of Versed  given intravenously in slow incremental doses.  Once the patient was  adequately sedated and maintained on low-flow oxygen and continuous cardiac  monitoring the Olympus video panendoscope was advanced through the mouth  piece over the tongue into the esophagus under direct vision.  The proximal  esophagus was somewhat dilated with distal narrowing.  25 units of Botox  were injected in each quadrant above the Z-line.  Adult EGD scope was then  advanced to the stomach.  Retroflexion was performed.  There was  mild  diffuse gastritis noted.  The proximal small bowel was not visualized.  The  scope was then withdrawn and an adult colonoscope was passed.  This  colonoscope was held at the Z-line for about 5 minutes to achieve better  results with regard to dilatation.  The patient tolerated the procedure well  without immediate complications.   IMPRESSION:  1. Dilated proximal esophagus with distal stricturing consistent with      achalasia. 25 units of Botox injected in each quadrant.  2. Mild diffuse gastritis.  3. Repeat dilatation done with an adult colonoscope after Botox injection.   IMPRESSION:   RECOMMENDATIONS:  1. Soft diet for the next couple of days.  2. Continue Prevacid, Solu-Tab or Nexium suspension whichever is easier      for the patient to get.  3. Repeat EGD in the next 3 months.  4. Outpatient follow-up in the next 2 weeks.  A colonoscopy will be done      within  the next month as the patient as due for one and has had a      history of abnormal  weight loss in the recent past.      Anselmo Rod, M.D.  Electronically Signed     JNM/MEDQ  D:  07/25/2006  T:  07/25/2006  Job:  [id number]   cc:   Osvaldo Shipper. Spruill, M.D.

## 2011-01-22 NOTE — Procedures (Signed)
[place]  Patient:    [name]                       MRN: [id number] Proc. Date: 07/22/99 Adm. Date:  [id number] Attending:  Charna Elizabeth                           Procedure Report  DATE OF BIRTH:  [birth year]  PROCEDURE PERFORMED:  Esophagogastroduodenoscopy with biopsies and balloon dilatation of esophageal stricture.  ENDOSCOPIST:  Anselmo Rod, M.D.  INSTRUMENT USED:  The Olympus video panendoscope and two balloon dilators.  INDICATIONS FOR PROCEDURE:  Dysphagia and recurrent hiccups in a 77 year old black male with a history of an esophageal stricture who has had several dilatations n the past.  PREPROCEDURE PREPARATION:  Informed consent was procured from the patient.  The  patient was fasted for eight hours prior to the procedure.  PREPROCEDURE PHYSICAL EXAMINATION:  VITAL SIGNS:  The patient had stable vital signs.  NECK:  Supple.  CHEST:  Clear to auscultation.  HEART:  S1 and S2 regular.  ABDOMEN:  Soft with normal abdominal bowel sounds.  No hepatosplenomegaly.  DESCRIPTION OF PROCEDURE:  The patient was placed in the left lateral decubitus  position and sedated with 35 mg of Demerol and 5 mg of Versed intravenously. Once the patient was adequately sedated and maintained on low-flow oxygen and continuous cardiac monitoring, the Olympus video panendoscope was advanced through the mouthpiece, over the tongue, and into the esophagus under direct vision.  The proximal esophagus appeared normal.  There was some distal esophagitis around 38 cm where the stricture was identified.  The scope was then advanced into the stomach. A small hiatal hernia was seen on high retroflexion.  The rest of the gastric mucosa appeared healthy.  Antral biopsies were done to rule-out presence of Helicobacter pylori by pathology.  The duodenal bulb showed evidence of multiple erosions and a small superficial duodenal ulcer.   There was no evidence of visible vessel.  The small bowel, distal to the bulb, appeared normal.  There was no outlet obstruction.  Once the upper GI examination was completed, two balloon dilators were used serially.  A #36 [place] and a #42 [place] dilator to dilate the esophageal stricture.  There was no bleeding after the procedure, and the patient tolerated the procedure well without complications.  IMPRESSION: 1. Distal esophageal stricture with mild esophagitis dilated with two balloon    dilators, a #36 and #42 [place]. 2. A small hiatal hernia. 3. Multiple antral erosions and a superficial duodenal ulcer. 4. Biopsies done for Helicobacter pylori by pathology. 5. Normal proximal small bowel, distal to the bulb.  RECOMMENDATIONS: 1. Continue PPIs at this time. 2. Treat with antibiotics if there is evidence of Helicobacter pylori infection. 3. Avoid all non-steroidals. 4. Soft diet for the next 2-3 days. 5. Outpatient followup within the next 10 days. DD:  07/22/99 TD:  07/22/99 Job: [id number]

## 2011-01-22 NOTE — Op Note (Signed)
[place]  Patient:    [name], [name] Visit Number: [id number] MRN: [id number]          Service Type: DSU Location: [facility]M 2550 10 Attending Physician:  Ernesto Rutherford Dictated by:   Ernesto Rutherford, M.D. Proc. Date: 10/02/01 Admit Date:  10/02/2001                             Operative Report  PREOPERATIVE DIAGNOSIS:  Macular hole, stage three, right eye with profound vision loss.  POSTOPERATIVE DIAGNOSIS:  Macular hole, stage three, right eye with profound vision loss.  PROCEDURES: 1. Posterior vitrectomy with internal limiting membrane peel - right eye. 2. Injection vitreous substitute - SF6 20% - right eye.  SURGEON:  Ernesto Rutherford, M.D.  ANESTHESIA:  Local retrobulbar, monitored anesthesia control.  INDICATION FOR PROCEDURE:  The patient is an 77 year old man, who is alert, bright, oriented and has profound vision loss in the right eye, inability to function with the basis of a stage three macular hole.  This is an attempt to close the macular hole so that the macular surface may be reattached and visual acuity have a chance to stabilize if not improve.  He understands 80% success rate of visual acuity improvement.  He gives consent for anesthesia, as well as surgical repair.  Appropriate signed consent was obtained.  The patient was taken to the operating room.  DESCRIPTION OF PROCEDURE:  In the operating room, appropriate monitoring was followed by mild sedation.  Marcaine 0.75% was delivered 5 cc retrobulbar, followed by an additional 5 cc laterally in the fascia modified Darel Hong. Right periocular region was sterilely prepped and draped in usual ophthalmic fashion.  Lid speculum applied.  Conjunctival peritomies fashioned temporally and superonasally.  A 4 mm infusion was secured 3.5 mm post to the limbus in the inferotemporal quadrant.  Placement in the vitreous cavity verified visually.  Superior sclerotomy then  fashioned.  Wild microscope was placed in position with Biom attachment.  Core vitrectomy was then begun.  Iatrogenic posterior vitreous attachment was carried out without difficulty nasal to the optic nerve.  ______ was then carried out overlying the posterior pole.  The vitreous was then elevated anterior to the equator 360 degrees.  Internal limiting membrane was then grasped with the [facility] forceps and removed over the macular region and traction was visualized to be decreased overlying the foveal edges, where the ILM was attached.  At this time, [facility] passive extrusion needle was then used to confirm that indeed there was excellent mobilization of the macular region.  Indeed, this was confirmed.  At this time, fluid/air exchange was then completed. Thereafter, an air/SF6 20% exchange was completed.  Excellent mobilization of the macular hole was noted.  The edges of the hole coapted nicely.  At this time, the instruments were removed the eye.  Superior sclerotomy was closed with 7-0 Vicryl suture.  The infusion removed and similarly closed with 7-0 Vicryl suture.  Conjunctiva closed with 7-0 Vicryl suture.  Subconjunctival injection of antibiotics and steroid applied.  The patient tolerated the procedure well without complication. Dictated by:   Ernesto Rutherford, M.D. Attending Physician:  Ernesto Rutherford DD:  10/02/01 TD:  10/03/01 Job: [id number]

## 2011-01-22 NOTE — H&P (Signed)
NAME:  [name], [name]                         ACCOUNT NO.:  [id number]   MEDICAL RECORD NO.:  [id number]                   PATIENT TYPE:  INP   LOCATION:  2301                                 FACILITY:  [facility]   PHYSICIAN:  Balinda Quails, M.D.                 DATE OF BIRTH:  [birth year]   DATE OF ADMISSION:  11/20/2003  DATE OF DISCHARGE:                                HISTORY & PHYSICAL   PRIMARY CARE PHYSICIAN:  Dr. Osvaldo Shipper. Spruill.   CARDIOLOGIST:  Dr. Deloris Ping Nahser.   CHIEF COMPLAINT:  Abdominal aortic aneurysm and right common iliac artery  aneurysm, and left inguinal hernia.   HISTORY OF PRESENT ILLNESS:  [name] is an 77 year old African American  male who was referred to Dr. Madilyn Fireman by Dr. Mills Koller for an evaluation  of an abdominal aortic aneurysm, and right common iliac artery aneurysm.  Apparently during  recent physical exam, the patient was noted to have an  abdominal bruits with a pulsatile mass.  Subsequently, an abdominal CT scan  was ordered which showed a bilobed saccular aneurysm in the distal abdominal  aortic and in the right common iliac artery.  In order to better determine  [name] anatomy, Dr. Madilyn Fireman did perform an abdominal aortogram with  pelvic runoff arteriography on September 27, 2003.  Findings did confirm a  large, saccular terminal aortic aneurysm and saccular aneurysm of the right  common iliac artery.  Initially, it was felt the patient may be a candidate  for stent graft repair of his aneurysm.  However, after further review, Dr.  Madilyn Fireman felt that open repair and grafting would be his best option.  In  addition, the patient also had a known left inguinal hernia with anticipated  repair by Dr. Talmage Nap.  It was felt that since he would be undergoing open  repair and grafting of his abdominal aortic aneurysm and right common iliac  artery aneurysm that Dr. Talmage Nap could also perform a left inguinal hernia  repair at the time of the surgery.   After discussing the risks, benefits and  alternatives with Dr. Beverely Pace, the patient did agree to proceed, and his  surgery was tentatively scheduled for November 20, 2003.   Preoperative testing done on March 14 showed no significant evidence of  bilateral lower-extremity arterial occlusive disease with ABI's greater than  1.0 bilaterally.  After undergoing a carotid duplex, results also showed no  significant internal or external carotid artery disease.  He was also  cleared by Dr. Elease Hashimoto on October 28, 2003, after having a normal adenosine  Cardiolite showing an ejection fraction of 58%.   [name] has been essentially asymptomatic of his aneurysm.  He denies  abdominal pain, nausea, vomiting, constipation, hematochezia, hematemesis,  claudication symptoms, peripheral edema, dysuria, hematuria, reflux symptoms  and shortness of breath.  He does have somewhat chronic low-back pain that  radiates  down his left leg which he says is due to a compressed nerve.  He  also has mild dyspnea on exertion.   PAST MEDICAL HISTORY:  1. A 4 cm infrarenal abdominal aortic aneurysm.  2. 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  3. A left inguinal hernia.  4. Hypertension.  5. Chronic hiccups with questionable relationship to gastroesophageal reflux     disease.  6. Chronic back pain.  7. History of anemia.  8. History of right eye cataract.   PAST SURGICAL HISTORY:  1. Hemorrhoidectomy.  2. Tonsillectomy.  3. Cataract surgery for removal of right cataract.  4. History of upper endoscopy.   MEDICATIONS:  1. Lotrel 10/20 mg one p.o. daily.  2. Prevacid 30 mg two tablets p.o. daily.  3. Amitriptyline 25 mg one p.o. t.i.d. p.r.n. muscle spasm.   ALLERGIES:  He has no known drug allergies, although he reports a hot  sensation after contrast dye injection.   REVIEW OF SYSTEMS:  Please review to history of present illness for  significant positives and negatives.  In addition, the patient  specifically  denies diabetes mellitus, kidney disease, transient ischemic attack,  cerebrovascular accident, amaurosis fugax or cardiac disease.   FAMILY HISTORY:  His mother died in her 65's and his father died in his  36's.  The patient reports their death is believed mostly secondary to old  age.  He denies that they had a history of stroke, coronary artery disease  or diabetes mellitus.  His sister died in her 15's from ovarian cancer.  He  also had a brother who died in his 61's secondary to a ruptured cerebral  aneurysm.   SOCIAL HISTORY:  He is married with two grown children.  He is a retired  Paramedic.  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  He reports smoking  approximately three packs of cigarettes per week in the past but has not  used tobacco products in the past 20 years.  He has also used a walker in  the past secondary to his chronic back pain.  He currently ambulates  independently.   PHYSICAL EXAMINATION:  VITAL SIGNS:  Blood pressure 136/100 in his left  upper extremity, pulse 76.  Respirations 18.  GENERAL APPEARANCE:  This is an 77 year old African American male who was  alert, cooperative and in no acute distress.  He has a rather thin body  habitus.  HEENT:  Head is normocephalic, atraumatic.  His right pupil does have a  somewhat irregular appearance, but is not large in size.  This is the eye  which he had previous cataract surgery on.  He is wearing glasses.  His  oropharynx is clear without evidence of erythema or lesions.  His oral  mucous membranes are pink and moist.  NECK:  Supple without JVD, lymphadenopathy or thyromegaly.  He does have 2+  carotid pulses without bruits.  CHEST:  Symmetrical on inspirations.  His lung sounds are diminished  throughout, but otherwise clear.  No wheezes, rhonchi or rales were noted. CARDIAC:  His heart had an underlying regular rate and  rhythm.  However,  there is an occasional ectopic beat.  No murmur, rub or gallop is noted.  ABDOMEN:  Soft, nontender, nondistended.  He has normoactive bowel sounds  x4.  He does have a right femoral bruits, and easily palpable pulsatile  abdominal aortic aneurysm.  No hepatosplenomegaly was noted.  GU:  These exams were deferred.  EXTREMITIES:  His extremities are warm and dry without edema or ulcerations.  He does have a 1+ left dorsalis pedis pulse, and a 2+ right dorsalis pedis  pulse.  He also has 2+ femoral pulses bilaterally.  His posterior tibial  pulses are barely palpable bilaterally.  NEUROLOGIC:  His neurologic exam was grossly intact.  His speech is clear.  He is alert and oriented x4.  His gait is steady.   ASSESSMENT:  Mr. Grape is an 77 year old African American male with a 4.0  cm infrarenal abdominal aortic aneurysm and a 3.7 cm right common iliac  artery aneurysm.  He also has a left inguinal hernia.   PLAN:  He will be electively admitted to [facility] on November 20, 2003, where he will undergoing apparent grafting of his abdominal aortic  aneurysm and right common iliac artery aneurysm by Dr. Madilyn Fireman.  During the  time of the surgery, he will also undergo left inguinal hernia repair by Dr.  Talmage Nap.  Dr. Madilyn Fireman has seen and evaluated this patient prior to this  admission and has explained the risks and benefits of the procedure, and the  patient has agreed to proceed.      Jerold Coombe, P.A.                  Balinda Quails, M.D.    AWZ/MEDQ  D:  11/19/2003  T:  11/21/2003  Job:  [id number]   cc:   Balinda Quails, M.D.  [address]  [place]  [place] [address]   Osvaldo Shipper. Spruill, M.D.  [address] [address]  [place]  [place] [address]  Fax: [phone]   Jake Shark A. Tanda Rockers, M.D.  [address]  [place]  [place] [address]  Fax: [phone]   Napman, Dr.

## 2011-01-22 NOTE — Discharge Summary (Signed)
[name], [name]               ACCOUNT NO.:  [id number]   MEDICAL RECORD NO.:  [id number]          PATIENT TYPE:  INP   LOCATION:  5504                         FACILITY:  [facility]   PHYSICIAN:  Anselmo Rod, M.D.  DATE OF BIRTH:  1919/06/14   DATE OF ADMISSION:  02/19/2008  DATE OF DISCHARGE:  03/05/2008                               DISCHARGE SUMMARY   REASON FOR ADMISSION:  1. Severe esophagitis, causing difficulty swallowing.  2. Failure to thrive.  3. Hypertension.  4. Coronary artery disease.  5. Iron-deficiency anemia.  6. Gastroesophageal reflux disease, gastritis, and hiatal hernia.  7. Esophageal stricture.  8. Chronic diverticulosis.   DISCHARGE DIAGNOSES:  1. Failure to thrive with feeding difficulties.  Patient is status      post percutaneous endoscopic gastrostomy tube placement.  2. Severe esophagitis, causing difficulty swallowing.  3. Hypertension.  4. Coronary artery disease.  5. Iron-deficiency anemia.  6. Gastroesophageal reflux disease, gastritis, and hiatal hernia.  7. Esophageal stricture.  8. Chronic diverticulosis.  9. Intermittent constipation.  10.Recurrent hiccups.   HOSPITAL COURSE:  [name] is a very pleasant 77 year old black  male who I have known for several years.  Patient has had severe reflux  with recurrent esophageal stricture requiring serial dilatations;  however, recently, the patient has had heart disease complicated by a  stent placement (a drug-eluting stent) for which the patient has been on  Plavix.  When a recent EGD was attempted, the patient had easy bleeding  from the distal esophagus, and dilatation was therefore not done.  The  patient was admitted to [facility] on 02/19/08 for PEG  placement under fluoroscopic guidance to be done by the Department of  Interventional Radiology.  The PEG was placed on February 19, 2008, and the  patient had post-procedural bleeding from the polypectomy site.  It was  controlled with application of pressure.  Plavix and aspirin were only  held for one day (the day of the procedure).  These were resumed the  next day, and Dr. Sharyn Lull saw the patient to make sure no further  changes were to be made from a cardiac standpoint.  Thereafter, the  patient was seen by the nutrition service, and tube feedings were  started.  He received Jevity, and the dosage was gradually increased  until the goal was achieved.  The patient's electrolytes were closely  monitored throughout the hospitalization, and the patient was given IV  PPI's (Protonix) for his reflux.  The patient has had difficulty  swallowing, even liquids during the hospitalization.  He had some pain  at the PEG site and therefore received narcotics, which caused  constipation; therefore, liquid Colace was given, with some relief of  his abdominal distention and constipation.  Prior to her discharge, the  patient's wife was educated by the nutrition service with regards to  tube feeds, and patient was ultimately discharged home after a prolonged  hospitalization on March 05, 2008 with plans for close followup in the  office.  Patient has been bothered by recurrent hiccups, and patient  received Thorazine with  some relief.   DISCHARGE MEDICATIONS:  1. Colace 100 mg 2 at bedtime.  2. Protonix 40 mg every morning.  Patient was to empty the Protonix      capsule in applesauce and consume that.  3. Tube feeds per the nutrition service.  4. Thorazine 12.5 mg at bedtime through the tube.  5. Aspirin 81 mg per day.  6. Plavix 75 mg per day.   DISCHARGE INSTRUCTIONS:  Patient was advised to see me in the office in  followup in the next 7-10 days.  Further recommendations will be made in  followup.  All over-the-counter nonsteroidals are to be avoided.  For  further details, please refer to the hospital chart.      Anselmo Rod, M.D.  Electronically Signed     JNM/MEDQ  D:  05/01/2008  T:   05/01/2008  Job:  [id number]

## 2011-01-22 NOTE — Procedures (Signed)
[place]  Patient:    [name], [name]                      MRN: [id number] Proc. Date: 10/26/00 Adm. Date:  10/26/00 Attending:  Anselmo Rod, M.D.                           Procedure Report  DATE OF BIRTH:  04-May-1919.  REFERRING PHYSICIAN:  PROCEDURE PERFORMED:  Esophagogastroduodenoscopy.  ENDOSCOPIST:  Anselmo Rod, M.D.  INSTRUMENT USED:  Olympus video panendoscope.  INDICATIONS FOR PROCEDURE:  Recurrent dysphagia with a history of distal esophageal stricture in an elderly African-American male.  Repeat dilatation planned.  PREPROCEDURE PREPARATION:  Informed consent was procured from the patient. The patient was fasted for eight hours prior to the procedure.  PREPROCEDURE PHYSICAL:  The patient had stable vital signs.  Neck supple. Chest clear to auscultation.  S1, S2 regular.  Abdomen soft with normal abdominal bowel sounds.  DESCRIPTION OF PROCEDURE:  The patient was placed in left lateral decubitus position and sedated with 50 mg of Demerol and 5 mg of Versed intravenously. Once the patient was adequately sedated and maintained on low-flow oxygen and continuous cardiac monitoring, the Olympus video panendoscope was advanced through the mouthpiece, over the tongue, into the esophagus under direct vision.  The proximal esophagus appeared normal.  There was severe narrowing of the esophagus at the Z-line.  There was spontaneous dilatation of the stricture with the passage of scope from the distal esophagus into the stomach.  A small hiatal hernia was seen on high retroflexion.  The rest of the gastric mucosa appeared normal except for mild antral gastritis.  There was mild duodenitis in the duodenum bulb ____________ small bowel distal bulb appeared normal.  IMPRESSION: 1. Distal esophageal stricture spontaneously dilated with passage of scope    into the stomach. 2. Small hiatal hernia. 3. Mild antral  gastritis. 4. Mild duodenitis in duodenal bulb.  RECOMMENDATION: 1. Carafate slurry is being prescribed for the patient 1 gm q.i.d. for    the next 15 days. 2. He is to continue his PPI. 3. Avoid all nonsteroidals. 4. Outpatient follow-up in the next two weeks.  Further recommendations for    repeat dilatation will be made at that time. DD:  10/26/00 TD:  10/26/00 Job: [id number]

## 2011-01-22 NOTE — Procedures (Signed)
[place]  Patient:    [name], [name]                      MRN: [id number] Proc. Date: 01/28/00 Adm. Date:  [id number] Disc. Date: [id number] Attending:  Charna Elizabeth                           Procedure Report  DATE OF BIRTH:  [birth year].  PROCEDURE PERFORMED:  Esophagogastroduodenoscopy with Savary dilatation of a distal esophageal stricture.  ENDOSCOPIST:  Anselmo Rod, M.D.  INSTRUMENTS USED:  Olympus video panendoscope and Savary dilators.  INDICATION:  Severe dysphagia in a 77 year old black male with a distal esophageal stricture, dilatation planned.  PREPROCEDURE PREPARATION:  Consent was procured from the patient.  The patient was fasted for eight hours prior to the procedure.  PREPROCEDURE PHYSICAL EXAMINATION:  VITAL SIGNS:  The patient had stable vital signs.  NECK:  Supple.  CHEST:  Clear to auscultation.  S1, S2 regular.  ABDOMEN:  Soft with normal abdominal bowel sounds.  DESCRIPTION OF THE PROCEDURE:  The patient was placed in the left lateral decubitus position, and sedated with 55 mg of Demerol and 5 mg of Versed intravenously.  Once the patient was adequately sedated and maintained on low flow oxygen, continuous cardiac monitoring, the Olympus video panendoscope was advanced through the mouthpiece, over the tongue, into the esophagus under direct vision.  There was a tight esophageal stricture at the GE junction. This was dilated with Savary dilators 10, 11, 12, 12.8, 14, 15, and 16 [place]. There was some antral gastritis seen.  There was severe duodenitis in the duodenal bulb.  The small bowel distal to the bulb appeared normal.  A small hiatal hernia was seen on high retroflexion.  The patient tolerated the procedure well with complications.  IMPRESSION: 1. Tight stricture at GE junction dilated with Savary dilators. 2. Antral gastritis. 3. Small hiatal hernia. 4. Severe duodenitis in duodenal  bulb.  RECOMMENDATIONS: 1. Continue Prilosec. 2. Outpatient follow-up in the next two weeks. 3. Soft diet for four to five days. 4. Avoid all nonsteroidals for now. DD:  03/31/00 TD:  03/31/00 Job: [id number]

## 2011-01-22 NOTE — Op Note (Signed)
   NAME:  [name], [name]                         ACCOUNT NO.:  [id number]   MEDICAL RECORD NO.:  [id number]                   PATIENT TYPE:  AMB   LOCATION:  ENDO                                 FACILITY:  [facility]   PHYSICIAN:  Anselmo Rod, M.D.               DATE OF BIRTH:  1919-07-07   DATE OF PROCEDURE:  10/18/2002  DATE OF DISCHARGE:                                 OPERATIVE REPORT   PROCEDURE:  Esophagogastroduodenoscopy.   ENDOSCOPIST:  Anselmo Rod, M.D.   INSTRUMENT USED:  Olympus video panendoscope.   INDICATIONS FOR PROCEDURE:  Severe dysphagia with hiccups in an 77 year old  African-American male with a history of esophageal stricture secondary to  severe reflux.  The patient had Botox injections done at [facility] by  Dr. Bluford Main without any significant results.  Therefore repeat EGD with  possible dilatation is planned.   PROCEDURE PREPARATION:  Informed consent was procured from the patient. The  patient was fasted for eight hours prior to the procedure.   PROCEDURE PHYSICAL:  VITAL SIGNS:  Stable.  NECK:  Supple.  CHEST: Clear to auscultation.  S1 and S2 regular.  ABDOMEN: Soft with normal bowel sounds.   DESCRIPTION OF PROCEDURE:  The patient was placed in the left lateral  decubitus position and sedated with 30 mg of Demerol and 4 mg of Versed  intravenously. Once the patient was adequately sedated and maintained on low  flow oxygen, continuous cardiac monitoring, the Olympus panendoscope was  advanced through the mouthpiece, over the tongue, and into the esophagus  under direct vision. There was evidence of distal esophagitis and  spontaneous dilatation of mild stricturing of the GE junction with passage  of the scope.  This was withdrawn and passed through the GE junction several  times to help with dilatation.  No dilators were used as there was  significant esophagitis in the distal esophagus.  There was duodenitis noted  in the duodenal bulb.  No other masses or polyps were seen.  Retroflexion  revealed no acute abnormalities with some bleeding from the spontaneous  dilatation with passage of the scope.   RECOMMENDATIONS:  1. Continue PPI.  2.     Carafate slurry 1 gram q.i.d. between meals.  3. Outpatient follow-up in the next two weeks for further recommendations.  4. Soft diet for now.                                               Anselmo Rod, M.D.    JNM/MEDQ  D:  10/18/2002  T:  10/18/2002  Job:  [id number]   cc:   Osvaldo Shipper. Spruill, M.D.  [address] [address]  [place]  [place] [address]  Fax: [phone]

## 2011-01-22 NOTE — Op Note (Signed)
[place]  Patient:    [name], [name] Visit Number: [id number] MRN: [id number]          Service Type: DSU Location: [facility] 2899 [id number] Attending Physician:  Karenann Cai Dictated by:   Marya Landry Carlyle Lipa., M.D. Admit Date:  07/20/2001 Discharge Date: 07/20/2001                             Operative Report  PREOPERATIVE DIAGNOSIS:  Immature cataract, right eye.  POSTOPERATIVE DIAGNOSIS:  Immature cataract, right eye.  PROCEDURE:  Kelman phacoemulsification cataract, right eye.  ANESTHESIA:  Local using Xylocaine 2% and Marcaine 0.75% with Wydase.  INDICATIONS:  This is an 77 year old gentleman who complains of inability to see from the right eye.  He was evaluated and found to have a visual acuity best corrected to 20/400 on the right and 20/30 on the left.  There were bilateral cataracts, worse on the left than the right.  Cataract extraction with intraocular lens implantation was recommended.  He is admitted at this time for that purpose.  DESCRIPTION OF PROCEDURE:  Under the influence of IV sedation, Van Lint akinesia and retrobulbar anesthesia was given.  The patient was prepped and draped in the usual manner.  The lid speculum was inserted under the upper and lower lid of the right eye and a 4-0 silk traction sheath was fastened to the belly of the superior rectus muscle for traction.  A fornix-based conjunctival flap was turned and hemostasis was achieved by the use of cautery.  An incision was made in the sclera approximately 1 mm posterior to the limbus. This incision was dissected down through the cornea using a crescent blade.  A stab port incision was made at the 1:30 oclock position.  Ocucoat was injected into the eye through the stab port incision.  The anterior chamber was then entered through the corneoscleral tunnel incision at the 11:30 oclock position.  An anterior capsulotomy was done using a bent  25-gauge needle.  The nucleus was hydrodissected using Xylocaine.  The KP handpiece was fastened on the eye and the nucleus was emulsified without difficulty.  The residual cortical material was aspirated.  The wound was then widened slightly to accommodate an oval silicon lens.  This lens was seated into the eye behind the iris without difficulty.  The anterior chamber was reformed and the pupil was constricted using Miochol.  The residual Ocucoat was aspirated from the eye.  The lips of the wound were hydrated with saline and the wound was tested to make sure there was no leak.  After ascertaining that the wound was airtight and watertight, the conjunctiva was closed using thermocautery.  One cc of Celestone and 0.5 cc of gentamycin were injected subconjunctivally. Maxitrol ophthalmic ointment and Pilopine ointment were applied along with a patch and soft shield.  The patient tolerated the procedure well and was discharged t the post anesthesia recovery room in satisfactory condition.  He is instructed to rest today, to take Vicodin q.4h. as needed for pain and to see me in the office tomorrow for further evaluation.  DISCHARGE DIAGNOSIS:  Immature cataract, right eye. Dictated by:   Marya Landry Carlyle Lipa., M.D. Attending Physician:  Karenann Cai DD:  07/20/01 TD:  07/20/01 Job: [id number]

## 2011-01-22 NOTE — Op Note (Signed)
NAME:  [name], [name]                         ACCOUNT NO.:  [id number]   MEDICAL RECORD NO.:  [id number]                   PATIENT TYPE:  INP   LOCATION:  2301                                 FACILITY:  [facility]   PHYSICIAN:  Balinda Quails, M.D.                 DATE OF BIRTH:  [birth year]   DATE OF PROCEDURE:  11/20/2003  DATE OF DISCHARGE:                                 OPERATIVE REPORT   PREOPERATIVE DIAGNOSES:  Infrarenal abdominal aortic aneurysm and right  common iliac artery aneurysm.   POSTOPERATIVE DIAGNOSES:  Infrarenal abdominal aortic aneurysm and right  common iliac artery aneurysm.   PROCEDURE:  1. Repair of infrarenal abdominal aortic aneurysm and right common iliac     aneurysm with aortobi-iliac graft.  2. Reimplantation of inferior mesenteric artery.   SURGEON:  Balinda Quails, M.D.   ASSISTANT:  Quita Skye. Hart Rochester, M.D. and Pecola Leisure, [place].   ANESTHETIC:  General endotracheal.   CLINICAL NOTE:  [name] is an 77 year old male who was found to have a  saccular infrarenal abdominal aortic aneurysm and right common iliac artery  aneurysm.  He underwent arteriography to further evaluate these.  He is not  a candidate for aortic stent grafting.  He is brought to the operating room  at this time for operative repair.  Risks and benefits of the operative  procedure explained to the patient in detail with major morbidity of  approximately 3-5% to include, but not limited to, MI, CVA, renal failure,  limb loss, bleeding, infection and death.   OPERATIVE PROCEDURE:  The patient was brought to the operating room in  stable condition, was placed in the supine position.   General endotracheal anesthesia induced.  Foley catheter, arterial lines and  Swan-Ganz catheter in place.  The abdomen and both legs were prepped and  draped in the sterile fashion.   Initial left inguinal hernia repair had been carried out by Dr. Lurene Shadow under  separate procedure and dictated  under a separate heading.   Midline skin incision was made from xiphoid to pubis.  Incision extended  deeply through the subcutaneous tissue with electrocautery.  Dissection was  carried down to the linea alba.  The fascia was incised.  The peritoneal  cavity entered without difficulty.   A laparotomy evaluation was carried out.   The liver, gallbladder, bile ducts, pancreas were all normal.  The stomach  and duodenum were unremarkable.  Small bowel was normal.  Large bowel  revealed no masses.   There was a large saccular aneurysm at the termination of the abdominal  aorta and then aneurysm in the right common iliac artery.  Both of these  were saccular in nature.   The small bowel was brought to the right, the transverse colon brought  superiorly.  The retroperitoneum incised over the infrarenal aorta.  Dissection carried proximally up to expose the left renal vein  and inferior  mesenteric vein, which were both preserved.  The infrarenal aorta cleared at  the level of the renal arteries for clamp placement.  Lumbar vessels  controlled with clips.  Dissection carried down to expose the origin of the  inferior mesenteric artery, which was patent, this was encircled with a  vessel loop.   Further dissection carried down to the aortic bifurcation.  The left common  iliac artery was normal in caliber.  This was dissected out down to its  bifurcation and encircled with vessel loops in the left internal, external  iliac arteries.   The right common iliac artery revealed saccular aneurysm.  This went down to  involve the right iliac bifurcation.  The right internal iliac artery was  encircled with an umbilical tape and ligated.  The right external iliac  artery was freed and the right ureter reflected inferiorly.  The right  external iliac artery encircled the vessel loop.   The patient administered 25 grams of __________ intravenously, 5000 units of  heparin intravenously.  Adequate  __________.   The infrarenal aorta was controlled with aortic DeBakey clamp.  The left  common iliac artery controlled with coarctation clamp, as was the right  external iliac artery.   The saccular aortic aneurysm was opened longitudinally with electrocautery.  This was opened proximally up to the infrarenal aorta where the aorta was  divided.  A 16 x 8 bifurcated Hemashield graft was then anastomosed end-to-  end to the infrarenal aorta using running 4-0 Prolene suture.  At completion  of the proximal anastomosis, the aorta was flushed and the graft controlled  on each limb with a Fogarty clamp.   The left limb of the graft was then brought down to the left common iliac  artery.  This was divided transversely.  The left limb of the graft  anastomosed end-to-end to the common iliac artery with running 4-0 Prolene  suture.  The graft then flushed and the left leg reperfused.   The right limb of the graft brought down to the right external iliac artery.  The right external iliac artery was beveled.  The right limb of the graft  divided and anastomosed end-to-end to the right external iliac artery using  running 4-0 Prolene suture.  At the completion of the right iliac  anastomosis, the graft was flushed.  Clamps were removed, re perfusing the  right leg.   Attention then placed on the inferior mesenteric artery.  This was resected  from the aortic wall.  It was endarterectomized.  A partial occlusion clamp  was placed on the aortic graft and this was opened longitudinally with an 11  blade.  The inferior mesenteric artery was then reimplanted into the aortic  graft in an end-to-side fashion with running 6-0 Prolene suture.  At  completion of this, clamps were removed.  The IMA was patent following  reimplantation.   Adequate hemostasis was obtained.  Sponge and instrument counts were  correct.  The patient administered 50 mg of Protamine intravenously.  The retroperitoneum was then  closed over the graft with running 3-0 Vicryl  suture.  The abdomen was examined to be sure there were no retained  instruments or sponges.   The midline fascia was then closed with running #1 PDS suture.  Sterile  dressings were applied to the incision.  There were no apparent  complications.  The patient was transferred directly to the surgical  intensive care unit in stable condition.  Balinda Quails, M.D.    PGH/MEDQ  D:  11/21/2003  T:  11/25/2003  Job:  [id number]   cc:   Leonie Man, M.D.  [address]  [address]  [place]  [place] [address]  Fax: [phone]   Osvaldo Shipper. Spruill, M.D.  [address] [address]  [place]  [place] [address]  Fax: [phone]

## 2011-01-22 NOTE — Op Note (Signed)
NAME:  [name], [name]                         ACCOUNT NO.:  [id number]   MEDICAL RECORD NO.:  [id number]                   PATIENT TYPE:  INP   LOCATION:  2301                                 FACILITY:  [facility]   PHYSICIAN:  Leonie Man, M.D.                DATE OF BIRTH:  [birth year]   DATE OF PROCEDURE:  11/20/2003  DATE OF DISCHARGE:                                 OPERATIVE REPORT   PREOPERATIVE DIAGNOSIS:  Left inguinal hernia.   POSTOPERATIVE DIAGNOSIS:  Left inguinal hernia.   OPERATION PERFORMED:  Left inguinal herniorrhaphy with mesh.   SURGEON:  Leonie Man, M.D.   ASSISTANT:  Johny Shears, RN, FA   ANESTHESIA:  General.   INDICATIONS FOR PROCEDURE:  The patient is an 77 year old gentleman  consulted for right inguinal hernia.  On evaluation he was noted to have  abdominal bruise and on work-up noted to have both an abdominal aortic  aneurysm and a right iliac aneurysm.  He comes to the operating room now  both for repair of his right inguinal hernia to be followed by resection and  grafting of his abdominal and iliac aortic aneurysms.  The patient is fully  aware of the risks and potential benefits of repair of his inguinal hernia  and he gives consent to surgery.   DESCRIPTION OF PROCEDURE:  Following the induction of satisfactory general  anesthesia, the patient was positioned supinely and the left groin was  prepped and draped to be included in the sterile operative field.  A  transverse incision was then carried down in the left lower abdominal crease  after infiltrating this area with 0.5% Marcaine with epinephrine.  Dissection was carried down to the external oblique aponeurosis which was  opened up through the external inguinal ring.  The ilioinguinal nerve was  retracted cephalad and medially and the spermatic cord was elevated and held  with a Penrose drain.  Dissecting within the spermatic cord, there was no  indirect inguinal hernia noted; however,  there was a very large saccular,  direct hernia which was dissected free from the cord and reduced into the  retroperitoneum.  The large defect was then repaired with an onlay patch of  polypropylene mesh which was sewn in at the pubic tubercle and carried up  along the conjoined tendon to the internal ring and again from the pubic  tubercle up along the shelving edge of Poupart's ligament to the internal  ring.  The tails of the mesh were then split so as to allow normal  protrusion of the cord.  The tails of the mesh were then sutured down into  the internal oblique muscles behind the cord avoiding the ilioinguinal  nerve.  The newly formed internal ring was snug but adequate with adequate  room for the spermatic cord to protrude without any vascular compromise.  Sponge, instrument and sharp counts were then verified.  The external  oblique aponeurosis was closed over the  spermatic cord with a running 2-0 Vicryl suture.  The Scarpa's fascia was  then closed with a running 3-0 Vicryl suture and the skin closed with  running 4-0 Monocryl suture, then reinforced with Steri-Strips.  At the end  of this procedure,  P. Liliane Bade, M.D. will enter the room both for  aneurysmectomy and grafting.                                               Leonie Man, M.D.    PB/MEDQ  D:  11/20/2003  T:  11/21/2003  Job:  [id number]   cc:   Balinda Quails, M.D.  [address]  [place]  [place] [address]   Osvaldo Shipper. Spruill, M.D.  [address] [address]  [place]  [place] [address]  Fax: [phone]

## 2011-01-22 NOTE — Op Note (Signed)
NAME:  [name], [name]               ACCOUNT NO.:  [id number]   MEDICAL RECORD NO.:  [id number]          PATIENT TYPE:  AMB   LOCATION:  ENDO                         FACILITY:  [facility]   PHYSICIAN:  Anselmo Rod, M.D.  DATE OF BIRTH:  01-26-1919   DATE OF PROCEDURE:  11/07/2006  DATE OF DISCHARGE:                               OPERATIVE REPORT   PROCEDURE PERFORMED:  Esophagogastroduodenoscopy with balloon dilatation  of esophageal stricture.   ENDOSCOPIST:  Anselmo Rod, M.D.   INSTRUMENT USED:  Pentax video panendoscope.   INDICATIONS FOR PROCEDURE:  The patient is an 77 year old African-  American male with a history of reflux and recurrent stricture in the  distal esophagus, ongoing problem with hiccups, undergoing  esophagogastroduodenoscopy with dilatation.   PREPROCEDURE PREPARATION:  Informed consent was procured from the  patient.  The risks and benefits of the procedure were discussed with  the patient in great detail. The patient was fasted for eight hours  prior to the procedure. Patient received Labetelol 10 mg prior to the  procedure.   PREPROCEDURE PHYSICAL:  The patient had stable vital signs except for  significantly elevated blood pressures of 210/120.  Neck supple, chest  clear to auscultation.  S1, S2 regular.  Abdomen soft with normal bowel  sounds.   DESCRIPTION OF PROCEDURE:  The patient was placed in the left lateral  decubitus position and sedated with 40 mcg of fentanyl and 2 mg of  Versed intravenously in slow incremental doses. The patient also  received 10 mg of Labetalol prior to procedure given intravenously.  Blood pressure dropped to 190/133 but was still significantly elevated  at the end of procedure.  Once the patient was adequately sedated, the  Pentax video panendoscope was advanced through the mouth piece over the  tongue into the esophagus under direct vision.  Mild stricturing was  noted in the distal esophagus but the scope could  be advanced through  this stricture into the stomach and then to the proximal small bowel.  Small hiatal hernia was seen on high retroflexion.  The gastric mucosa  in the proximal small bowel appeared normal.  The scope was then  withdrawn into the distal esophagus and the stricture was dilated  sequentially with the balloon dilator (radial expansion balloon dilator)  at 12 mm for two minutes, 13.5 mm for two minutes, and 15 mm for two  minutes. The patient tolerated the procedure well. There was mild  mucosal tearing at the site of dilatation. The rest of the exam was  unremarkable.  The patient tolerated the procedure well.   IMPRESSION:  1. Distal esophageal stricture dilated with radial expanding balloon      dilator.  2. Small hiatal hernia.  3. Normal stomach and proximal small bowel.   RECOMMENDATIONS:  1. Continue proton pump inhibitor.  2. Outpatient followup in the next four weeks.  3. Thorazine is going to be called into patient's pharmacy as it helps      with his hiccups.  4. Further recommendation to be made in follow-up.  The patient has  been advised to see Dr. Shana Chute for his sustained elevated blood      pressures.  Further recommendation to be made by Dr. Shana Chute in      that regard.      Anselmo Rod, M.D.  Electronically Signed     JNM/MEDQ  D:  11/07/2006  T:  11/07/2006  Job:  [id number]   cc:   Osvaldo Shipper. Spruill, M.D.

## 2011-01-22 NOTE — Op Note (Signed)
NAME:  [name], [name]                         ACCOUNT NO.:  [id number]   MEDICAL RECORD NO.:  [id number]                   PATIENT TYPE:  AMB   LOCATION:  ENDO                                 FACILITY:  [facility]   PHYSICIAN:  Charna Elizabeth, M.D.                   DATE OF BIRTH:  [birth year]   DATE OF PROCEDURE:  05/25/2002  DATE OF DISCHARGE:  05/25/2002                                 OPERATIVE REPORT   PROCEDURE PERFORMED:  Esophagogastroduodenoscopy.   ENDOSCOPIST:  Charna Elizabeth, M.D.   INSTRUMENT USED:  Olympus video panendoscope.   INDICATIONS OF PROCEDURE:  Dysphagia in an 77 year old African-American male  with a history of esophagus stricture in the past, rule out recurrent  stricture, distal esophagitis, etc.   PREPROCEDURE PREPARATION:  Informed consent was procured from the patient.  The patient fasted for eight hours prior to the procedure.   PREPROCEDURE PHYSICAL:  The patient had stable vital signs.  Neck supple.  Chest clear to auscultation.  S1 and S2 regular.  Abdomen soft with normal  bowel sounds.   DESCRIPTION OF PROCEDURE:  The patient was placed in the left lateral  decubitus position and sedated with 30 mg of Demerol and 3 mg of Versed  intravenously.  Once the patient was adequately sedated and maintained on  low-flow oxygen and continuous cardiac monitoring, the Olympus video  panendoscope was advanced through the mouthpiece, over the tongue, into the  esophagus under direct vision.  There was evidence of distal esophagitis  with mild narrowing at the GE junction which dilated spontaneously with the  passage of the scope.  There was a slight tear in the esophageal mucosa at  this point with bleeding.  Mild antral gastritis was noted.  Mild duodenitis  was noted in the duodenal bulb.  The small bowel and distal stomach appeared  normal.  No ulcers, masses or polyps were seen.   IMPRESSION:  1. Mild narrowing in gastroesophageal junction with distal  esophagitis,     dilated spontaneously with passage of scope.  2. Mild antral gastritis.  3. Mild duodenitis.   RECOMMENDATIONS:  1. Continue PPI and since the patient  cannot swallow the Nexium, Prevacid     slurry may be used along with     Carafate suspension, 1 g, three to four times a day.  2. Soft diet for now.  3. Outpatient followup within the next week.                                               Charna Elizabeth, M.D.    JM/MEDQ  D:  05/25/2002  T:  05/28/2002  Job:  [id number]   cc:   Osvaldo Shipper. Spruill, M.D.  [address] [address]  [place]  [place] [address]  Fax: [phone]

## 2011-01-22 NOTE — Procedures (Signed)
[facility]  Patient:    [name], [name] Visit Number: [id number] MRN: [id number]          Service Type: END Location: ENDO Attending Physician:  Charna Elizabeth Proc. Date: 05/09/01 Admit Date:  05/09/2001   CC:         Osvaldo Shipper. Spruill, M.D.   Procedure Report  DATE OF BIRTH:  01-22-1919  PROCEDURE PERFORMED:  Esophagogastroduodenoscopy.  ENDOSCOPIST:  Anselmo Rod, M.D.  INSTRUMENT USED:  Olympus video panendoscope.  INDICATIONS FOR PROCEDURE:  Recurrent dysphagia in an 77 year old African-American male who is having difficulty swallowing his pills.  The patient has had an esophageal stricture in the past and had multiple dilatations.  PREPROCEDURE PREPARATION: Informed consent was procured from the patient and the patient fasted for eight hours prior to the procedure.  PREPROCEDURE PHYSICAL:  VITAL SIGNS:  Stable.  NECK:  Supple.  CHEST:  Clear to auscultation, S1 and S2 regular.  ABDOMEN:  Soft with normal bowel sounds.  DESCRIPTION OF PROCEDURE:  The patient was placed in the left lateral decubitus position, and sedated with 20 mg of Demerol and 4 mg of Versed intravenously.  Once the patient was adequately sedated, maintained on low flow oxygen and continuous cardiac monitoring, the Olympus video panendoscope was advanced through the mouth piece over the tongue into the esophagus and under direct vision.  There was evidence of distal esophagitis.  There was spontaneous dilatation of the distal esophagus with gentle passing of the scope into the stomach.  There was some bleeding noted initially on retroflexion, and then on direct visualization of the distal esophagus.  There was patchy hemorrhagic gastritis throughout the gastric mucosa, and moderate to severe duodenitis in the duodenal bulb.  The patient tolerated the procedure well without complication.  No dilatation was done because of significant distal  esophagitis.  IMPRESSION: 1. Distal esophagitis with stricturing of the esophagus, some spontaneous    dilatation with the passage of the scope. 2. Patchy hemorrhagic gastritis. 3. Moderate to severe duodenitis in the duodenal bulb.  RECOMMENDATIONS: 1. The patient will be treated with Aciphex 20 mg one p.o. q.d. along with    Carafate 31 g q.i.d. for the next four weeks, and repeat EGD will be done    to attempt dilatation at that time. 2. Outpatient follow up is advised in the next four weeks, earlier if need be. 3. A soft diet is to be consumed on a regular basis for now until further    recommendations are made. Attending Physician:  Charna Elizabeth DD:  05/09/01 TD:  05/09/01 Job: [id number]

## 2011-01-22 NOTE — Op Note (Signed)
NAME:  [name], [name]               ACCOUNT NO.:  [id number]   MEDICAL RECORD NO.:  [id number]          PATIENT TYPE:  AMB   LOCATION:  ENDO                         FACILITY:  [facility]   PHYSICIAN:  Anselmo Rod, M.D.  DATE OF BIRTH:  Aug 10, 1919   DATE OF PROCEDURE:  06/15/2006  DATE OF DISCHARGE:                                 OPERATIVE REPORT   PROCEDURE PERFORMED:  Esophagogastroduodenoscopy.   ENDOSCOPIST:  Anselmo Rod, M.D.   INSTRUMENT USED:  Olympus videoscopic pan endoscope.   INDICATIONS FOR PROCEDURE:  An 77 year old African-American male with a  history of peptic stricture, undergoing a repeat dilatation of possible  peptic stricture.  Patient has had worsening dysphagia in the recent past,  rule out esophagitis.   PRE-PROCEDURE PREPARATION:  Informed consent was procured from the patient.  The patient was fasted for eight hours prior to the procedure.  The risks  and benefits of the procedure were discussed with him in great detail.   PRE-PROCEDURE PHYSICAL EXAMINATION:  VITAL SIGNS:  Stable vital signs.  NECK:  Supple.  CHEST:  Clear to auscultation.  HEART:  S1 and S2.  Regular.  ABDOMEN:  Soft with normal bowel sounds.   DESCRIPTION OF PROCEDURE:  The patient was placed in the left lateral  decubitus position and sedated with 75 mcg of fentanyl and 7.5 mg of Versed,  given intravenously in slow, incremental doses.  Once the patient was  adequately sedated and maintained on low flow oxygen with continuous cardiac  monitoring, the Olympus videoscopic pan endoscope was advanced with the  mouthpiece over the tongue into the esophagus under direct vision.  The  vocal cords appeared healthy.  The proximal esophagus was dilated with  distal stricture at the GE junction.  The scope was passed through this  area.  There was a small tear at the GE junction, and spontaneous dilatation  was achieved.  There was diffuse gastritis noted in the stomach without  evidence  of a hiatal hernia on high retroflexion.  The proximal small bowel  appeared normal.  There was no obstruction.  The pylorus seemed somewhat  stenotic but the scope was gently advanced over this area without any  problems after multiple attempts.  The patient tolerated the procedure well  without complications.  Savary dilatation was not done because there was a  small tear at the GE junction and spontaneous dilatation after passing the  scope into this area.  Pictures were taken after spontaneous dilatations  that demonstrated significant patency.   IMPRESSION:  1. Dilated esophagus with the distal stricture spontaneously dilated with      passage of the scope.  2. Diffuse gastritis with no evidence of hiatal hernia.  3. Normal proximal small bowel.   RECOMMENDATIONS:  1. As the patient is not able to swallow capsules and pills, Nexium      suspension packages will be given to him as samples, and a prescription      if needed.  2. Low residue soft diet has been recommended along with liberal fluid      intake.  3. A barium swallow will be scheduled for him within the next week and a      manometry planned if there is evidence of achalasia on the barium      study.      Anselmo Rod, M.D.  Electronically Signed     JNM/MEDQ  D:  06/15/2006  T:  06/16/2006  Job:  [id number]   cc:   Osvaldo Shipper. Spruill, M.D.

## 2011-01-25 ENCOUNTER — Other Ambulatory Visit: Payer: Self-pay | Admitting: Internal Medicine

## 2011-01-26 ENCOUNTER — Telehealth: Payer: Self-pay | Admitting: Internal Medicine

## 2011-01-26 MED ORDER — TIOTROPIUM BROMIDE MONOHYDRATE 18 MCG IN CAPS: 18.0000 ug | ORAL_CAPSULE | Freq: Every day | RESPIRATORY_TRACT | Status: DC

## 2011-01-26 NOTE — Telephone Encounter (Signed)
Rx for spiriva was sent to pharm.

## 2011-03-18 ENCOUNTER — Other Ambulatory Visit: Payer: Self-pay | Admitting: Dermatology

## 2011-03-29 ENCOUNTER — Emergency Department (HOSPITAL_COMMUNITY): Payer: Medicare Other

## 2011-03-29 ENCOUNTER — Inpatient Hospital Stay (HOSPITAL_COMMUNITY)
Admission: EM | Admit: 2011-03-29 | Discharge: 2011-04-05 | DRG: 193 | Disposition: A | Discharge status: Home Health | Payer: Medicare Other | Attending: Cardiology | Admitting: Cardiology

## 2011-03-29 DIAGNOSIS — J449 Chronic obstructive pulmonary disease, unspecified: Secondary | ICD-10-CM | POA: Diagnosis present

## 2011-03-29 DIAGNOSIS — Z832 Family history of diseases of the blood and blood-forming organs and certain disorders involving the immune mechanism: Secondary | ICD-10-CM

## 2011-03-29 DIAGNOSIS — J189 Pneumonia, unspecified organism: Principal | ICD-10-CM | POA: Diagnosis present

## 2011-03-29 DIAGNOSIS — K219 Gastro-esophageal reflux disease without esophagitis: Secondary | ICD-10-CM | POA: Diagnosis present

## 2011-03-29 DIAGNOSIS — J4489 Other specified chronic obstructive pulmonary disease: Secondary | ICD-10-CM | POA: Diagnosis present

## 2011-03-29 DIAGNOSIS — R066 Hiccough: Secondary | ICD-10-CM | POA: Diagnosis present

## 2011-03-29 DIAGNOSIS — I2510 Atherosclerotic heart disease of native coronary artery without angina pectoris: Secondary | ICD-10-CM | POA: Diagnosis present

## 2011-03-29 DIAGNOSIS — E46 Unspecified protein-calorie malnutrition: Secondary | ICD-10-CM | POA: Diagnosis present

## 2011-03-29 DIAGNOSIS — I10 Essential (primary) hypertension: Secondary | ICD-10-CM | POA: Diagnosis present

## 2011-03-29 DIAGNOSIS — I252 Old myocardial infarction: Secondary | ICD-10-CM

## 2011-03-29 DIAGNOSIS — Z7982 Long term (current) use of aspirin: Secondary | ICD-10-CM

## 2011-03-29 DIAGNOSIS — E7800 Pure hypercholesterolemia, unspecified: Secondary | ICD-10-CM | POA: Diagnosis present

## 2011-03-29 DIAGNOSIS — R578 Other shock: Secondary | ICD-10-CM | POA: Diagnosis present

## 2011-03-29 LAB — CBC
HCT: 37.7 % — ABNORMAL LOW (ref 39.0–52.0)
Hemoglobin: 12.9 g/dL — ABNORMAL LOW (ref 13.0–17.0)
MCH: 30.5 pg (ref 26.0–34.0)
MCHC: 34.2 g/dL (ref 30.0–36.0)
RDW: 13.7 % (ref 11.5–15.5)

## 2011-03-29 LAB — URINALYSIS, ROUTINE W REFLEX MICROSCOPIC
Glucose, UA: NEGATIVE mg/dL
Hgb urine dipstick: NEGATIVE
Specific Gravity, Urine: 1.012 (ref 1.005–1.030)
Urobilinogen, UA: 1 mg/dL (ref 0.0–1.0)
pH: 6.5 (ref 5.0–8.0)

## 2011-03-29 LAB — POCT I-STAT, CHEM 8
Creatinine, Ser: 1.4 mg/dL — ABNORMAL HIGH (ref 0.50–1.35)
Glucose, Bld: 146 mg/dL — ABNORMAL HIGH (ref 70–99)
HCT: 38 % — ABNORMAL LOW (ref 39.0–52.0)
Hemoglobin: 12.9 g/dL — ABNORMAL LOW (ref 13.0–17.0)
TCO2: 21 mmol/L (ref 0–100)

## 2011-03-29 LAB — URINE MICROSCOPIC-ADD ON

## 2011-03-29 LAB — COMPREHENSIVE METABOLIC PANEL
ALT: 20 U/L (ref 0–53)
AST: 16 U/L (ref 0–37)
Albumin: 2.9 g/dL — ABNORMAL LOW (ref 3.5–5.2)
Alkaline Phosphatase: 60 U/L (ref 39–117)
Potassium: 4.3 mEq/L (ref 3.5–5.1)
Sodium: 135 mEq/L (ref 135–145)
Total Protein: 6.6 g/dL (ref 6.0–8.3)

## 2011-03-29 LAB — TROPONIN I: Troponin I: <0.3 ng/mL (ref <0.30)

## 2011-03-29 LAB — DIFFERENTIAL
Basophils Relative: 0 % (ref 0–1)
Eosinophils Relative: 1 % (ref 0–5)
Monocytes Absolute: 0.4 10*3/uL (ref 0.1–1.0)
Monocytes Relative: 6 % (ref 3–12)
Neutro Abs: 5.3 10*3/uL (ref 1.7–7.7)

## 2011-03-29 LAB — RAPID URINE DRUG SCREEN, HOSP PERFORMED
Opiates: POSITIVE — AB
Tetrahydrocannabinol: NOT DETECTED

## 2011-03-29 LAB — PROCALCITONIN: Procalcitonin: <0.1 ng/mL

## 2011-03-29 LAB — CK TOTAL AND CKMB (NOT AT ARMC): Relative Index: INVALID (ref 0.0–2.5)

## 2011-03-29 LAB — ETHANOL: Alcohol, Ethyl (B): <11 mg/dL (ref 0–11)

## 2011-03-30 LAB — CARDIAC PANEL(CRET KIN+CKTOT+MB+TROPI)
CK, MB: 5 ng/mL — ABNORMAL HIGH (ref 0.3–4.0)
CK, MB: 5.2 ng/mL — ABNORMAL HIGH (ref 0.3–4.0)
Troponin I: <0.3 ng/mL (ref <0.30)

## 2011-03-30 LAB — GLUCOSE, CAPILLARY: Glucose-Capillary: 94 mg/dL (ref 70–99)

## 2011-03-31 LAB — BASIC METABOLIC PANEL
Calcium: 9 mg/dL (ref 8.4–10.5)
Creatinine, Ser: 0.88 mg/dL (ref 0.50–1.35)
GFR calc Af Amer: >60 mL/min (ref >60)

## 2011-03-31 LAB — CBC
MCH: 30.3 pg (ref 26.0–34.0)
MCHC: 33.9 g/dL (ref 30.0–36.0)
MCV: 89.5 fL (ref 78.0–100.0)
Platelets: 204 10*3/uL (ref 150–400)
RDW: 13.7 % (ref 11.5–15.5)

## 2011-03-31 LAB — URINE CULTURE
Colony Count: NO GROWTH
Culture  Setup Time: 201207240137

## 2011-04-01 LAB — GLUCOSE, CAPILLARY
Glucose-Capillary: 123 mg/dL — ABNORMAL HIGH (ref 70–99)
Glucose-Capillary: 90 mg/dL (ref 70–99)
Glucose-Capillary: 86 mg/dL (ref 70–99)

## 2011-04-02 ENCOUNTER — Inpatient Hospital Stay (HOSPITAL_COMMUNITY): Payer: Medicare Other

## 2011-04-02 LAB — GLUCOSE, CAPILLARY
Glucose-Capillary: 111 mg/dL — ABNORMAL HIGH (ref 70–99)
Glucose-Capillary: 115 mg/dL — ABNORMAL HIGH (ref 70–99)

## 2011-04-03 LAB — GLUCOSE, CAPILLARY
Glucose-Capillary: 89 mg/dL (ref 70–99)
Glucose-Capillary: 120 mg/dL — ABNORMAL HIGH (ref 70–99)
Glucose-Capillary: 104 mg/dL — ABNORMAL HIGH (ref 70–99)

## 2011-04-04 LAB — GLUCOSE, CAPILLARY
Glucose-Capillary: 99 mg/dL (ref 70–99)
Glucose-Capillary: 129 mg/dL — ABNORMAL HIGH (ref 70–99)
Glucose-Capillary: 108 mg/dL — ABNORMAL HIGH (ref 70–99)

## 2011-04-05 ENCOUNTER — Inpatient Hospital Stay (HOSPITAL_COMMUNITY): Payer: Medicare Other

## 2011-04-05 LAB — BASIC METABOLIC PANEL
BUN: 24 mg/dL — ABNORMAL HIGH (ref 6–23)
CO2: 31 mEq/L (ref 19–32)
Calcium: 8.9 mg/dL (ref 8.4–10.5)
Glucose, Bld: 102 mg/dL — ABNORMAL HIGH (ref 70–99)
Sodium: 138 mEq/L (ref 135–145)

## 2011-04-05 LAB — CULTURE, BLOOD (ROUTINE X 2)
Culture  Setup Time: 201207240144
Culture: NO GROWTH

## 2011-04-05 LAB — GLUCOSE, CAPILLARY
Glucose-Capillary: 86 mg/dL (ref 70–99)
Glucose-Capillary: 98 mg/dL (ref 70–99)
Glucose-Capillary: 131 mg/dL — ABNORMAL HIGH (ref 70–99)

## 2011-04-05 LAB — CBC
HCT: 41.7 % (ref 39.0–52.0)
Hemoglobin: 14.1 g/dL (ref 13.0–17.0)
MCH: 30.5 pg (ref 26.0–34.0)
MCHC: 33.8 g/dL (ref 30.0–36.0)
RBC: 4.63 MIL/uL (ref 4.22–5.81)

## 2011-04-22 NOTE — Discharge Summary (Signed)
[name], [name]               ACCOUNT NO.:  [id number]  MEDICAL RECORD NO.:  [id number]  LOCATION:  4730                         FACILITY:  [facility]  PHYSICIAN:  Eduardo Osier. Sharyn Lull, M.D. DATE OF BIRTH:  Dec 23, 1918  DATE OF ADMISSION:  03/29/2011 DATE OF DISCHARGE:  04/05/2011                              DISCHARGE SUMMARY   ADMITTING DIAGNOSES: 1. Bilateral pneumonia heard. 2. Status post altered mental status. 3. Coronary artery disease. 4. History of inferior wall myocardial infarction in the past. 5. Status post hypotensive shock, probably secondary to medication. 6. Hypertension. 7. Chronic obstructive pulmonary disease. 8. Gastroesophageal reflux disease. 9. History of dysphagia. 10.History of esophageal stricture. 11.Chronic hiccups. 12.Hypercholesteremia. 13.History of diverticulosis. 14.Mild renal insufficiency. 15.Protein-calorie malnutrition. 16.History of tobacco abuse and alcohol abuse. 17.Mild chronic kidney disease.  DISCHARGE DIAGNOSES: 1. Status post bilateral pneumonia. 2. Chronic obstructive pulmonary disease. 3. Coronary artery disease. 4. History of inferior wall myocardial infarction in the past. 5. Status post hypotensive shock secondary to medication/questionable     sepsis. 6. Hypertension. 7. Gastroesophageal reflux disease. 8. History of dysphagia. 9. History of esophageal stricture. 10.Chronic hiccups. 11.Hypercholesteremia. 12.History of diverticulosis. 13.History of mild renal insufficiency which is stable. 14.Protein-calorie malnutrition. 15.History of tobacco and alcohol abuse in the past.  DISCHARGE HOME MEDICATIONS: 1. Enteric-coated aspirin 81 mg 1 tablet daily. 2. Plavix 75 mg 1 tablet daily. 3. Acetaminophen 650 mg every 6 hours as needed. 4. Carvedilol 3.125 mg twice daily. 5. Thorazine 10 mg three times daily as needed for hiccups. 6. Losartan 50 mg 1 tablet twice daily. 7. Baclofen oral solution 5 mL four times daily as  before. 8. Ferrous sulfate 325 mg daily as before. 9. Jevity nutritional supplement one and a half cans 3 times daily as     before. 10.Nexium 40 mg daily as before. 11.Stop Rapaflo capsule. 12.Spiriva 18 mcg 1 capsule 1 inhalation daily.  DIET:  Low salt, low cholesterol.  ACTIVITY:  Increase activity slowly as tolerated with assistance.  CONDITION AT DISCHARGE:  Stable.  Follow up with me in 1 week.  BRIEF HISTORY AND HOSPITAL COURSE:  Mr. Romig is a 77 year old black male with past medical history significant for coronary artery disease, history of inferior wall MI status post PCI to RCA, hypertension, hypercholesteremia, GERD, history of esophageal stricture, COPD, dysphagia, status post PEG, history of diverticulosis, mild renal insufficiency, anemia, malnutrition, chronic hiccup, and history of tobacco and alcohol abuse in the remote past.  She came to the ER via EMS complaining of progressive sluggish and slow mentation and lethargy for the last 2 days associated with cough per wife.  Denies any fever or chills.  Denies chest pain, shortness of breath.  The patient was noted to be somnolent and lethargic, received IV fluids and Avelox with improvement in his blood pressure and mental status.  The patient was nonresponsive appropriately to verbal commands and follow simple commands.  The patient was noted to have bilateral new infiltrates.  PAST MEDICAL HISTORY:  As above.  Also, had left lung abscess in the past.  PAST SURGICAL HISTORY:  He had AAA repair in the past, had ventral hernia repair in the past, had multiple  esophageal dilatation in the past followed by PEG tube insertion.  She had PTCA stenting to RCA.  SOCIAL HISTORY:  He is married, retired.  Smoked one-pack per day for 20+ years, quit in 1980s.  Used to drink heavily for 15-20 years, quit in 1980s.  ALLERGIES:  NO KNOWN DRUG ALLERGIES.  PHYSICAL EXAMINATION:  GENERAL:  He was drowsy, but easily  arousable. VITAL SIGNS:  Blood pressure was 91/55, pulse was 64.  Repeat pressure of fluid challenge was 103/57. HEENT:  Conjunctiva was pink.  No facial asymmetry. NECK:  Supple.  No JVD. LUNGS:  He has decreased breath sounds at bases with bilateral rhonchi. CARDIOVASCULAR:  S1 and S2 was normal.  There was soft systolic murmur. ABDOMEN:  Soft.  Bowel sounds were present, nontender. EXTREMITIES:  There was no clubbing, cyanosis or edema.  LABORATORY DATA:  Hemoglobin was 12.9, hematocrit 37.7, white count of 6.3 with left shift.  Sodium was 135, potassium 4.3, BUN 25, creatinine 1.39, glucose was 159.  Two sets of cardiac enzymes were negative. Urine culture was negative.  Blood cultures were negative.  Labs today hemoglobin is 14.1, hematocrit 41.7, white count of 5.3.  Sodium is 138, potassium 4.7, glucose 102, BUN 24, creatinine 1.03.  Chest x-ray done on July 23rd showed bilateral infiltrate, probable pneumonia.  Repeat x- ray on July 27 showed COPD and probable bronchitis, some improvement in lung opacity at the bases.  Repeat x-ray done today showed emphysematous changes and fibrosis in the lungs, no active consolidation.BRIEF HOSPITAL COURSE:  The patient was admitted to telemetry unit, pan cultures were obtained and was started on IV Avelox with improvement in his symptoms and resolution in his cough.  OT/PT consultation was obtained.  Patient is ambulating in room with assistance with walker. The patient will be discharged home on above medications and will be followed up in my office in 1 week.     Eduardo Osier. Sharyn Lull, M.D.     [facility]  D:  04/05/2011  T:  04/05/2011  Job:  [id number]  Electronically Signed by Rinaldo Cloud M.D. on 04/22/2011 08:06:41 PM

## 2011-04-27 ENCOUNTER — Encounter: Payer: Self-pay | Admitting: Emergency Medicine

## 2011-04-27 ENCOUNTER — Ambulatory Visit (INDEPENDENT_AMBULATORY_CARE_PROVIDER_SITE_OTHER)
Admission: RE | Admit: 2011-04-27 | Discharge: 2011-04-27 | Disposition: A | Discharge status: Home / Self Care | Payer: Medicare Other | Source: Ambulatory Visit | Attending: Emergency Medicine | Admitting: Emergency Medicine

## 2011-04-27 ENCOUNTER — Ambulatory Visit (INDEPENDENT_AMBULATORY_CARE_PROVIDER_SITE_OTHER): Payer: Medicare Other | Admitting: Emergency Medicine

## 2011-04-27 VITALS — BP 118/66 | HR 83 | Temp 98.2°F | Ht 69.0 in | Wt 142.0 lb

## 2011-04-27 DIAGNOSIS — J449 Chronic obstructive pulmonary disease, unspecified: Secondary | ICD-10-CM

## 2011-04-27 DIAGNOSIS — J4489 Other specified chronic obstructive pulmonary disease: Secondary | ICD-10-CM

## 2011-04-27 NOTE — Patient Instructions (Signed)
CXR today Walking oximetry today shows that your need to wear your oxygen with all exertion Start taking your Spiriva every day Follow up with Dr Marchelle Gearing next available opening.

## 2011-04-27 NOTE — Assessment & Plan Note (Signed)
Tells me that his compliance with Spiriva is better, but that he still misses doses sometimes. Does not wear O2 with exertion reliably - CXR today - walking oximetry today and reinforce that he needs to wear as instructed by Dr Marchelle Gearing - Spiriva compliance - rov Dr MR next available

## 2011-04-27 NOTE — Progress Notes (Signed)
  Subjective:    Patient ID: [name], male    DOB: 16-Mar-1919, 77 y.o.   MRN: [id number]  HPI  Followup for COPD - s/p rehab compleetion Aug 2011.No desaturation walking 185 feet x 3 laps in Oct 2011 , LUL Nodule - pseudotumor -, 2-3cm, PET negative March 2011. Background hx of LLL necrotixing pna and dysphagia/peg tube - Summer 2010 with clearane by March 2011.  OV 01/08/2011: Followup COPD. Presents with wife. He has turned 77. Denies active complaints. No change in dyspnea, baseline cough. FEels well. Still using PEG 100% of the time but appears to be having increasing reflux. Not seen Dr. Loreta Ave of GI in a long time per wife. We noticed he desaturated walking across hallways; this is a new finding for him. So we got cxr and did not show any infiltrates to account for new exertional hypoxemia. He then admitted to noncompliance with all inhalers.   Acute visit 04/27/11 -- Followed by Dr Marchelle Gearing for COPD, hx of LLL necrotizing PNA. Has been on Spiriva daily, but admits that some days he forgets it. He uses a nebulizer but rarely. He reports that his breathing has been worsening since last seen by Dr R. He was admitted in July '12 for apparent PNA, treated with avelox with improvement. He is improved now, but reports continued cough with brownish/yellow mucous. His feeds are all via PEG tube. He has O2 that he uses primarily when asleep, sometimes w exertion but not always.     Review of Systems Increased reflux + Non compliance with meds + Otherwise no change and 12 point ROS negative and per HPI     Objective:   Physical Exam General:  well developed, well nourished, in no acute distress.  Eyes:  PERRLA/EOM intact; conjunctiva and sclera clear Nose:  no deformity, discharge, inflammation, or lesions Mouth:  no deformity or lesions Neck:  no masses, thyromegaly, or abnormal cervical nodes Lungs:  decreased BS bilateral and prolonged exhilation.   Heart:  regular rate and rhythm, S1, S2  without murmurs, rubs, gallops, or clicks Abdomen:  soft normal bowel sounds peg tube + Msk:  no deformity or scoliosis noted with normal posture Pulses:  pulses normal Extremities:  no clubbing, cyanosis, edema, or deformity noted Neurologic:  CN II-XII grossly intact with normal reflexes, coordination, muscle strength and tone Skin:  intact without lesions or rashes Cervical Nodes:  no significant adenopathy  Assessment & Plan:  Chronic airway obstruction, not elsewhere classified Tells me that his compliance with Spiriva is better, but that he still misses doses sometimes. Does not wear O2 with exertion reliably - CXR today - walking oximetry today and reinforce that he needs to wear as instructed by Dr Marchelle Gearing - Spiriva compliance - rov Dr MR next available

## 2011-04-27 NOTE — Progress Notes (Signed)
  Subjective:    Patient ID: [name], male    DOB: [birth year], 77 y.o.   MRN: [id number]  HPI    Review of Systems     Objective:   Physical Exam        Assessment & Plan:  SATURATION QUALIFICATIONS:  Patient Saturations on Room Air at Rest = 94%  Patient Saturations on Room Air while Ambulating = 85%  Patient Saturations on 2 Liters of oxygen while Ambulating = 94%

## 2011-05-21 ENCOUNTER — Encounter: Payer: Self-pay | Admitting: Internal Medicine

## 2011-05-21 ENCOUNTER — Ambulatory Visit (INDEPENDENT_AMBULATORY_CARE_PROVIDER_SITE_OTHER): Payer: Medicare Other | Admitting: Internal Medicine

## 2011-05-21 VITALS — BP 122/72 | HR 75 | Temp 97.1°F | Ht 69.0 in | Wt 139.8 lb

## 2011-05-21 DIAGNOSIS — J449 Chronic obstructive pulmonary disease, unspecified: Secondary | ICD-10-CM

## 2011-05-21 DIAGNOSIS — Z23 Encounter for immunization: Secondary | ICD-10-CM

## 2011-05-21 NOTE — Progress Notes (Signed)
Subjective:    Patient ID: [name], male    DOB: 01-19-1919, 77 y.o.   MRN: [id number]  HPI Problem List 1.  COPD Gold stage 2- s/p rehab compleetion Aug 2011. Rx spiriva -No desaturation walking 185 feet x 3 laps in Oct 2011 - New exertional hypoxema - May 2012; commenced on O2  2. LUL Nodule - pseudotumor -, 2-3cm, PET negative March 2011.  3. h of LLL necrotixing pna and dysphagia/peg tube - Summer 2010, Cleared March 201 4. Chronic PEG feed due to ? Achalasia - fu with Dr Loreta Ave   OV 05/21/11: Followup COPD. Recent July 2012 AECOPD/pna. Fu with Dr Chauncy Passy last month - cxr clear. Overall well. Finished home pt recently after July admit. Still c/o sllowly progressive dyspnea on exertion relieved by rest over past 6 months. Walking from room to room with o2 makes him dyspneic. He wants to see better relief. No cough. No aspiration. No sputum. No fever No weight loss. Rates dyspnea as moderate. Following PEG feeds regularly.  Of note, in past he has admitted poor compliance with both spiriva and o2; this time he and wife state that he is compliant with both  Past Medical History  Diagnosis Date  . Necrotizing pneumonia 03-20-2009  . Nodule of left lung   . Nodule of right lung     resolved  . HTN (hypertension)   . Esophageal stricture   . Dysphagia   . Hiccups   . Chronic atrial fibrillation   . Anemia   . COPD (chronic obstructive pulmonary disease)   . Coronary heart disease   . Hiatal hernia   . Heart attack 09-2007  . Abdominal aneurysm      Family History  Problem Relation Age of Onset  . Breast cancer Sister   . Cancer Sister   . Cancer Brother   . Cancer Brother   . Cancer Brother      History   Social History  . Marital Status: Married    Spouse Name: N/A    Number of Children: N/A  . Years of Education: N/A   Occupational History  . Not on file.   Social History Main Topics  . Smoking status: Former Smoker -- 1.0 packs/day for 30 years    Types:  Cigarettes    Quit date: 09/06/2004  . Smokeless tobacco: Not on file  . Alcohol Use: No     hx of ETOH abuse, quit x 34yrs ago  . Drug Use: Not on file  . Sexually Active: Not on file   Other Topics Concern  . Not on file   Social History Narrative  . No narrative on file     No Known Allergies   Outpatient Prescriptions Prior to Visit  Medication Sig Dispense Refill  . acetaminophen-codeine (TYLENOL #4) 300-60 MG per tablet 1 tablet by PEG Tube route every 4 (four) hours as needed.       . baclofen (LIORESAL) 10 MG tablet 10 mg by PEG Tube route 5 (five) times daily.       . clopidogrel (PLAVIX) 75 MG tablet 75 mg by PEG Tube route daily. Takes every other day      . esomeprazole (NEXIUM) 40 MG capsule 40 mg by PEG Tube route daily before breakfast.        . ferrous sulfate (SLOW FE) 160 (50 FE) MG TBCR 1 tablet by PEG Tube route 2 (two) times daily.       . Nutritional  Supplements (JEVITY PLUS) LIQD at 7am, 11am, 3pm, 7pm, and 10pm. Each time tube flushes past tube feeds       . tiotropium (SPIRIVA HANDIHALER) 18 MCG inhalation capsule Place 1 capsule (18 mcg total) into inhaler and inhale daily.  30 capsule  6  . NON FORMULARY Radiflow,  ?mg 1 by mouth daily             Review of Systems  Constitutional: Negative for fever and unexpected weight change.  HENT: Negative for ear pain, nosebleeds, congestion, sore throat, rhinorrhea, sneezing, trouble swallowing, dental problem, postnasal drip and sinus pressure.   Eyes: Negative for redness and itching.  Respiratory: Negative for cough, chest tightness, shortness of breath and wheezing.   Cardiovascular: Negative for palpitations and leg swelling.  Gastrointestinal: Negative for nausea and vomiting.  Genitourinary: Negative for dysuria.  Musculoskeletal: Negative for joint swelling.  Skin: Negative for rash.  Neurological: Negative for headaches.  Hematological: Does not bruise/bleed easily.    Psychiatric/Behavioral: Negative for dysphoric mood. The patient is not nervous/anxious.        Objective:   Physical Exam  General:  well developed, well nourished, in no acute distress.  Eyes:  PERRLA/EOM intact; conjunctiva and sclera clear Nose:  no deformity, discharge, inflammation, or lesions Mouth:  no deformity or lesions Neck:  no masses, thyromegaly, or abnormal cervical nodes Lungs:  decreased BS bilateral and prolonged exhilation.   Heart:  regular rate and rhythm, S1, S2 without murmurs, rubs, gallops, or clicks Abdomen:  soft normal bowel sounds peg tube + Msk:  no deformity or scoliosis noted with normal posture Pulses:  pulses normal Extremities:  no clubbing, cyanosis, edema, or deformity noted Neurologic:  CN II-XII grossly intact with normal reflexes, coordination, muscle strength and tone Skin:  intact without lesions or rashes      Assessment & Plan:

## 2011-05-21 NOTE — Assessment & Plan Note (Signed)
Unclear why he has slightly progressive dyspnea past 6 months. In past I had put this down to poor spiriva compliance and not using o2 compliantly. Assuming he is now compliant (though doubt it), I wonder if copd is worse or he is just deconditioned or spiriva is inadequate. Discussed different options with him and wife. We agreed on stopping spiriva and chanigng to nebs of atrovent and pulmicort and reeassess.  They agree with plan. Wil check spirometry at fu

## 2011-05-21 NOTE — Patient Instructions (Addendum)
Unclear why you are more short of breath - could represent lack of fitness or lungs ageing Stop spiriva Have flu shot Have atrovent generic nebulizer 4 times daily Have pulmicort 0.25mg  neb twice daily USe albuterol neb as needed Return to see me in 3 months to report progress Spirometyr at followup

## 2011-05-26 LAB — CBC
HCT: 33 — ABNORMAL LOW
HCT: 35.4 — ABNORMAL LOW
HCT: 38.2 — ABNORMAL LOW
HCT: 42.2
Hemoglobin: 11.7 — ABNORMAL LOW
Hemoglobin: 12.6 — ABNORMAL LOW
Hemoglobin: 13.9
MCHC: 33.1
MCHC: 33.5
MCV: 86.7
MCV: 88.7
MCV: 89.1
MCV: 89.2
Platelets: 204
Platelets: 212
RBC: 3.94 — ABNORMAL LOW
RBC: 4.29
RDW: 15.3
RDW: 15.3
RDW: 15.6 — ABNORMAL HIGH
RDW: 15.7 — ABNORMAL HIGH
WBC: 10.6 — ABNORMAL HIGH
WBC: 9.9
WBC: 9.9

## 2011-05-26 LAB — I-STAT 8, (EC8 V) (CONVERTED LAB)
Acid-Base Excess: 2
BUN: 10
Bicarbonate: 28.6 — ABNORMAL HIGH
Chloride: 102
HCT: 47
Hemoglobin: 16
TCO2: 30

## 2011-05-26 LAB — BASIC METABOLIC PANEL
BUN: 12
BUN: 15
BUN: 17
CO2: 26
Calcium: 8.6
Calcium: 8.7
Calcium: 8.9
Chloride: 102
Chloride: 103
Creatinine, Ser: 1.14
Creatinine, Ser: 1.38
GFR calc Af Amer: >60
GFR calc non Af Amer: >60
GFR calc non Af Amer: >60
Glucose, Bld: 79
Glucose, Bld: 90
Sodium: 139

## 2011-05-26 LAB — COMPREHENSIVE METABOLIC PANEL
Albumin: 3 — ABNORMAL LOW
Alkaline Phosphatase: 48
BUN: 12
CO2: 29
Chloride: 101
Creatinine, Ser: 1.02
GFR calc non Af Amer: >60
Potassium: 4.4
Total Bilirubin: 0.6

## 2011-05-26 LAB — PROTIME-INR: INR: 1.2

## 2011-05-26 LAB — CARDIAC PANEL(CRET KIN+CKTOT+MB+TROPI)
CK, MB: 268 — ABNORMAL HIGH
Relative Index: 11.9 — ABNORMAL HIGH
Total CK: 1194 — ABNORMAL HIGH
Troponin I: 66.46

## 2011-05-26 LAB — CK TOTAL AND CKMB (NOT AT ARMC)
CK, MB: 34.8 — ABNORMAL HIGH
Total CK: 648 — ABNORMAL HIGH

## 2011-05-26 LAB — LIPID PANEL
HDL: 49
LDL Cholesterol: 80
Triglycerides: 35

## 2011-05-26 LAB — DIFFERENTIAL
Eosinophils Absolute: 0
Eosinophils Relative: 0
Lymphocytes Relative: 5 — ABNORMAL LOW
Lymphs Abs: 0.6 — ABNORMAL LOW
Monocytes Absolute: 0.9

## 2011-05-26 LAB — MAGNESIUM: Magnesium: 1.8

## 2011-05-26 LAB — APTT: aPTT: 35

## 2011-05-26 LAB — POCT CARDIAC MARKERS: Troponin i, poc: 22.3

## 2011-05-26 LAB — TROPONIN I: Troponin I: 42.38

## 2011-05-27 LAB — CBC
MCHC: 33.1
Platelets: 178
RDW: 14.9

## 2011-05-27 LAB — COMPREHENSIVE METABOLIC PANEL
Alkaline Phosphatase: 41
BUN: 10
CO2: 23
GFR calc non Af Amer: >60
Glucose, Bld: 85
Potassium: 3.7
Total Bilirubin: 0.9
Total Protein: 6.2

## 2011-05-27 LAB — DIFFERENTIAL
Basophils Absolute: 0
Basophils Relative: 0
Neutro Abs: 7.2
Neutrophils Relative %: 87 — ABNORMAL HIGH

## 2011-05-27 LAB — POCT I-STAT CREATININE: Operator id: 151321

## 2011-05-27 LAB — I-STAT 8, (EC8 V) (CONVERTED LAB)
Acid-base deficit: 5 — ABNORMAL HIGH
Chloride: 110
TCO2: 22
pCO2, Ven: 38.5 — ABNORMAL LOW
pH, Ven: 7.336 — ABNORMAL HIGH

## 2011-05-27 LAB — APTT: aPTT: 23 — ABNORMAL LOW

## 2011-05-27 LAB — CK TOTAL AND CKMB (NOT AT ARMC): Total CK: 66

## 2011-05-27 LAB — POCT CARDIAC MARKERS
CKMB, poc: 1.9
Myoglobin, poc: 126

## 2011-05-27 LAB — TROPONIN I: Troponin I: 0.03

## 2011-05-27 LAB — PROTIME-INR: INR: 1.2

## 2011-05-27 LAB — LIPID PANEL: Cholesterol: 108

## 2011-05-28 LAB — COMPREHENSIVE METABOLIC PANEL
ALT: 12
AST: 14
AST: 17
Albumin: 2.1 — ABNORMAL LOW
Albumin: 2.4 — ABNORMAL LOW
Albumin: 2.5 — ABNORMAL LOW
Alkaline Phosphatase: 34 — ABNORMAL LOW
Alkaline Phosphatase: 38 — ABNORMAL LOW
BUN: 14
BUN: 17
BUN: 9
CO2: 30
Calcium: 8.4
Chloride: 103
Chloride: 105
Chloride: 105
Chloride: 111
Creatinine, Ser: 0.77
Creatinine, Ser: 0.8
Creatinine, Ser: 0.85
GFR calc Af Amer: >60
GFR calc Af Amer: >60
GFR calc non Af Amer: >60
Glucose, Bld: 131 — ABNORMAL HIGH
Potassium: 3.9
Potassium: 4.5
Sodium: 141
Total Bilirubin: 0.5
Total Bilirubin: 0.5
Total Bilirubin: 0.6
Total Protein: 4.7 — ABNORMAL LOW
Total Protein: 5.1 — ABNORMAL LOW
Total Protein: 5.6 — ABNORMAL LOW

## 2011-05-28 LAB — CARDIAC PANEL(CRET KIN+CKTOT+MB+TROPI)
CK, MB: 4.1 — ABNORMAL HIGH
CK, MB: 4.3 — ABNORMAL HIGH
CK, MB: 6.3 — ABNORMAL HIGH
Relative Index: 3.4 — ABNORMAL HIGH
Relative Index: INVALID
Total CK: 184
Total CK: 85
Troponin I: 0.03
Troponin I: 0.04

## 2011-05-28 LAB — BASIC METABOLIC PANEL
CO2: 28
Calcium: 8 — ABNORMAL LOW
Calcium: 8.1 — ABNORMAL LOW
Calcium: 8.1 — ABNORMAL LOW
Chloride: 103
Creatinine, Ser: 0.79
Creatinine, Ser: 0.97
GFR calc Af Amer: >60
GFR calc Af Amer: >60
GFR calc Af Amer: >60
GFR calc non Af Amer: >60
GFR calc non Af Amer: >60
GFR calc non Af Amer: >60
Glucose, Bld: 102 — ABNORMAL HIGH
Glucose, Bld: 106 — ABNORMAL HIGH
Glucose, Bld: 92
Potassium: 4.6
Sodium: 134 — ABNORMAL LOW
Sodium: 137
Sodium: 137
Sodium: 141

## 2011-05-28 LAB — CBC
HCT: 31.5 — ABNORMAL LOW
HCT: 33.3 — ABNORMAL LOW
Hemoglobin: 10.2 — ABNORMAL LOW
Hemoglobin: 10.6 — ABNORMAL LOW
Hemoglobin: 11.2 — ABNORMAL LOW
MCV: 88.8
MCV: 89.1
Platelets: 127 — ABNORMAL LOW
Platelets: 160
Platelets: 169
Platelets: 173
RBC: 3.56 — ABNORMAL LOW
RBC: 3.82 — ABNORMAL LOW
RDW: 14.8
RDW: 14.9
WBC: 4.3
WBC: 5.4
WBC: 5.6
WBC: 5.8

## 2011-05-28 LAB — PHOSPHORUS
Phosphorus: 1.2 — ABNORMAL LOW
Phosphorus: 2.5
Phosphorus: 2.7
Phosphorus: 3.3
Phosphorus: 3.3

## 2011-05-28 LAB — DIFFERENTIAL
Basophils Absolute: 0
Basophils Relative: 0
Eosinophils Absolute: 0.2
Monocytes Absolute: 0.4
Monocytes Relative: 10
Neutro Abs: 3.2

## 2011-05-28 LAB — MAGNESIUM
Magnesium: 1.7
Magnesium: 1.9

## 2011-05-28 LAB — CHOLESTEROL, TOTAL: Cholesterol: 91

## 2011-06-03 LAB — CBC
HCT: 33.9 — ABNORMAL LOW
HCT: 38.3 — ABNORMAL LOW
Hemoglobin: 11 — ABNORMAL LOW
MCHC: 32.5
MCV: 82.6
Platelets: 205
RDW: 20.4 — ABNORMAL HIGH
RDW: 20.7 — ABNORMAL HIGH

## 2011-06-03 LAB — COMPREHENSIVE METABOLIC PANEL
ALT: 14
Alkaline Phosphatase: 44
CO2: 26
GFR calc non Af Amer: >60
Glucose, Bld: 94
Potassium: 3.8
Sodium: 140
Total Bilirubin: 0.7

## 2011-06-03 LAB — HEPATIC FUNCTION PANEL
Albumin: 3.2 — ABNORMAL LOW
Alkaline Phosphatase: 37 — ABNORMAL LOW
Total Bilirubin: 0.6

## 2011-06-03 LAB — BASIC METABOLIC PANEL
BUN: 12
BUN: 4 — ABNORMAL LOW
CO2: 26
CO2: 31
Calcium: 7 — ABNORMAL LOW
Chloride: 104
Chloride: 109
Creatinine, Ser: 1.01
Creatinine, Ser: 1.11
GFR calc non Af Amer: >60
Glucose, Bld: 130 — ABNORMAL HIGH
Glucose, Bld: 87
Glucose, Bld: 96
Potassium: 3.5

## 2011-06-03 LAB — PHOSPHORUS: Phosphorus: 3.4

## 2011-06-03 LAB — APTT: aPTT: 32

## 2011-06-03 LAB — MAGNESIUM
Magnesium: 1.9
Magnesium: 2.1
Magnesium: 2.2

## 2011-06-17 LAB — KOH PREP

## 2011-08-19 ENCOUNTER — Ambulatory Visit (INDEPENDENT_AMBULATORY_CARE_PROVIDER_SITE_OTHER): Payer: Medicare Other | Admitting: Internal Medicine

## 2011-08-19 ENCOUNTER — Encounter: Payer: Self-pay | Admitting: Internal Medicine

## 2011-08-19 VITALS — BP 126/84 | HR 68 | Temp 98.4°F | Ht 69.0 in | Wt 139.0 lb

## 2011-08-19 DIAGNOSIS — J449 Chronic obstructive pulmonary disease, unspecified: Secondary | ICD-10-CM

## 2011-08-19 NOTE — Progress Notes (Signed)
Subjective:    Patient ID: [name], male    DOB: June 20, 1919, 77 y.o.   MRN: [id number]  HPI Problem List 1.  COPD Gold stage 2- s/p rehab compleetion Aug 2011. Rx spiriva -No desaturation walking 185 feet x 3 laps in Oct 2011 - New exertional hypoxema - May 2012; commenced on O2  2. LUL Nodule - pseudotumor -, 2-3cm, PET negative March 2011.  3. h of LLL necrotixing pna and dysphagia/peg tube - Summer 2010, Cleared March 201 4. Chronic PEG feed due to ? Achalasia - fu with Dr Loreta Ave   OV 05/21/11: Followup COPD. Recent July 2012 AECOPD/pna. Fu with Dr Chauncy Passy last month - cxr clear. Overall well. Finished home pt recently after July admit. Still c/o sllowly progressive dyspnea on exertion relieved by rest over past 6 months. Walking from room to room with o2 makes him dyspneic. He wants to see better relief. No cough. No aspiration. No sputum. No fever No weight loss. Rates dyspnea as moderate. Following PEG feeds regularly.  Of note, in past he has admitted poor compliance with both spiriva and o2; this time he and wife state that he is compliant with both  REC  Unclear why you are more short of breath - could represent lack of fitness or lungs ageing Stop spiriva Have flu shot Have atrovent generic nebulizer 4 times daily Have pulmicort 0.25mg  neb twice daily USe albuterol neb as needed Return to see me in 3 months to report progress Spirometyr at followup  OV 08/19/2011 Followup COPD. Still dyspneic. Last visit I switched him to nebulizers but he is still taking Spiriva and is using his nebulizers as needed. She has no idea what his nebulizers are. Wife says he is poorly compliant with medications. He still complains of class III dyspnea. Not any worse in September but gradually progressive since the early part of the year. I again counseled him about the importance of pulmonary rehabilitation. He initially refused but after I and his wife convinced him he agreed. No new  complaints  Past, Family, Social reviewed: no change since last visit   Review of Systems  Constitutional: Negative for fever and unexpected weight change.  HENT: Positive for sneezing. Negative for ear pain, nosebleeds, congestion, sore throat, rhinorrhea, trouble swallowing, dental problem, postnasal drip and sinus pressure.   Eyes: Negative for redness and itching.  Respiratory: Positive for shortness of breath. Negative for cough, chest tightness and wheezing.   Cardiovascular: Negative for palpitations and leg swelling.  Gastrointestinal: Negative for nausea, vomiting and diarrhea.  Genitourinary: Negative for dysuria.  Musculoskeletal: Negative for joint swelling.  Skin: Negative for rash.  Neurological: Negative for headaches.  Hematological: Does not bruise/bleed easily.  Psychiatric/Behavioral: Positive for dysphoric mood. The patient is nervous/anxious.        Objective:   Physical Exam  General:  well developed, well nourished, in no acute distress.  Eyes:  PERRLA/EOM intact; conjunctiva and sclera clear Nose:  no deformity, discharge, inflammation, or lesions Mouth:  no deformity or lesions Neck:  no masses, thyromegaly, or abnormal cervical nodes Lungs:  decreased BS bilateral and prolonged exhilation.   Heart:  regular rate and rhythm, S1, S2 without murmurs, rubs, gallops, or clicks Abdomen:  soft normal bowel sounds peg tube + Msk:  no deformity or scoliosis noted with normal posture Pulses:  pulses normal Extremities:  no clubbing, cyanosis, edema, or deformity noted Neurologic:  CN II-XII grossly intact with normal reflexes, coordination, muscle strength and tone  Skin:  intact without lesions or rashes       Assessment & Plan:

## 2011-08-19 NOTE — Patient Instructions (Signed)
Unclear why you are more short of breath - could represent lack of fitness or lungs ageing or other issues In order to sort all this out, first thing is you need to take your medications as advised and attend rehabilitation Currently you are not taking medications correctly as I advised in sept 2012  Stop spiriva Have atrovent generic nebulizer 4 times daily Have pulmicort 0.25mg nebulizer twice daily USe albuterol neb as needed Return to see my nurse Tammy next 2 weeks for med calendar We will re-refer you to pulmonary rehab - thanks for agreeing to attend that Return to see me in 4 months to report progress Spirometyr at followup  

## 2011-08-20 ENCOUNTER — Encounter: Payer: Self-pay | Admitting: Internal Medicine

## 2011-08-20 NOTE — Assessment & Plan Note (Signed)
Unclear why you are more short of breath - could represent lack of fitness or lungs ageing or other issues In order to sort all this out, first thing is you need to take your medications as advised and attend rehabilitation Currently you are not taking medications correctly as I advised in sept 2012  Stop spiriva Have atrovent generic nebulizer 4 times daily Have pulmicort 0.25mg  nebulizer twice daily USe albuterol neb as needed Return to see my nurse Tammy next 2 weeks for med calendar We will re-refer you to pulmonary rehab - thanks for agreeing to attend that Return to see me in 4 months to report progress Spirometyr at followup

## 2011-09-20 ENCOUNTER — Other Ambulatory Visit: Payer: Self-pay | Admitting: Gastroenterology

## 2011-09-20 DIAGNOSIS — R633 Feeding difficulties: Secondary | ICD-10-CM

## 2011-09-23 ENCOUNTER — Ambulatory Visit (HOSPITAL_COMMUNITY)
Admission: RE | Admit: 2011-09-23 | Discharge: 2011-09-23 | Disposition: A | Discharge status: Home / Self Care | Payer: Medicare Other | Source: Ambulatory Visit | Attending: Gastroenterology | Admitting: Gastroenterology

## 2011-09-23 DIAGNOSIS — R633 Feeding difficulties: Secondary | ICD-10-CM

## 2011-09-23 DIAGNOSIS — K9429 Other complications of gastrostomy: Secondary | ICD-10-CM | POA: Insufficient documentation

## 2011-09-23 NOTE — Procedures (Signed)
18 Fr G-tube replaced.

## 2011-11-24 ENCOUNTER — Ambulatory Visit (INDEPENDENT_AMBULATORY_CARE_PROVIDER_SITE_OTHER): Payer: Medicare Other | Admitting: Internal Medicine

## 2011-11-24 ENCOUNTER — Encounter: Payer: Self-pay | Admitting: Internal Medicine

## 2011-11-24 ENCOUNTER — Ambulatory Visit (INDEPENDENT_AMBULATORY_CARE_PROVIDER_SITE_OTHER)
Admission: RE | Admit: 2011-11-24 | Discharge: 2011-11-24 | Disposition: A | Discharge status: Home / Self Care | Payer: Medicare Other | Source: Ambulatory Visit | Attending: Internal Medicine | Admitting: Internal Medicine

## 2011-11-24 VITALS — BP 138/94 | HR 80 | Temp 97.0°F | Ht 69.0 in | Wt 144.0 lb

## 2011-11-24 DIAGNOSIS — R066 Hiccough: Secondary | ICD-10-CM | POA: Insufficient documentation

## 2011-11-24 DIAGNOSIS — J449 Chronic obstructive pulmonary disease, unspecified: Secondary | ICD-10-CM

## 2011-11-24 DIAGNOSIS — R0602 Shortness of breath: Secondary | ICD-10-CM

## 2011-11-24 MED ORDER — DOXYCYCLINE HYCLATE 100 MG PO TABS: 100.0000 mg | ORAL_TABLET | Freq: Two times a day (BID) | ORAL | Status: AC

## 2011-11-24 MED ORDER — PREDNISONE 10 MG PO TABS: ORAL_TABLET | ORAL | Status: DC

## 2011-11-24 NOTE — Assessment & Plan Note (Signed)
Will check cxr to see if any pulmonary cause for hiccups; clearly worse now

## 2011-11-24 NOTE — Assessment & Plan Note (Signed)
COPD is definitely worse. How much of this is due non compliance versus age related decline vs subacute AECOPD is unclear. He clearly has changed in past few years; he is now non compliant. His hiccups appear worse. Iwill RX him for AECOPD . I have emphasized compliance. AT followup need to sit down and have a goals of care visit. I will also get a cxr to see if there is pneumonia or other reasons for hiccups  PLAN I think your copd is acting up YOu really need to be regular with your nebulizer treatment Currently you are not taking medications correctly as I advised in sept 2012  Have atrovent generic nebulizer 4 times daily Have pulmicort 0.25mg  nebulizer twice daily USe albuterol neb as needed Please take Take prednisone 40mg  once daily x 3 days, then 30mg  once daily x 3 days, then 20mg  once daily x 3 days, then prednisone 10mg  once daily  x 3 days and stop - crush pills and take via peg tube Take doxycycline 100mg  po twice daily x 5 days; take after meals and avoid sunlight Have CXR today Return to see my nurse Tammy next 1 week  If hiccups continue at home, call Dr Loreta Ave or our office When you see Tammy you can discuss home rehab versus outpatient rehab

## 2011-11-24 NOTE — Progress Notes (Signed)
  Subjective:    Patient ID: [name], male    DOB: 1919/08/21, 78 y.o.   MRN: [id number]  HPI Problem List 1.  COPD Gold stage 2- s/p rehab compleetion Aug 2011. Rx spiriva -No desaturation walking 185 feet x 3 laps in Oct 2011 - New exertional hypoxema - May 2012; commenced on O2  - Non compliance with RX 2012 onwards  - Spirometry fev1 0.88L/36% and worst ever (non compliant with rx) 2. LUL Nodule - pseudotumor -, 2-3cm, PET negative March 2011.  3. h of LLL necrotixing pna and dysphagia/peg tube - Summer 2010, Cleared March 201 4. Chronic PEG feed due to ? Achalasia - fu with Dr Loreta Ave 5. Body mass index is 21.27 kg/(m^2). 6. AECOPD hx  - July 2012 - opd RX  OV 11/24/2011 Last visit sept 2012. Now returns 1 month earlier than usual because Dr Loreta Ave of GI had seen him yesterday and felt he was more dyspneic. Patient here with with wife. Tells me that since last office visit in December 2012 has had progressive dyspnea on exertion. Now dyspneic walking room to room . Relieved by rest. Associtaed tiredness present. Not taking nebulizer Rx as prescribed; hardly compliant. PEr wife uses o2 compliantly. Not attended rehab due to winter weather. Has baseline hiccups and some cough related to PEG tube; this is unchanged. Denies weight loss  Walking desaturation test 11/24/2011 185 feet x 3 laps: desaturated at first lap to 88%. Stopped due to severe hiccups.  Spirometry today - gold stage 4 copd; fev1 0.88L/36% and ratio 43  - significantly worse than few years ago   Past, Family, Social reviewed: no change since last visit  Review of Systems  Constitutional: Negative for fever and unexpected weight change.  HENT: Negative for ear pain, nosebleeds, congestion, sore throat, rhinorrhea, sneezing, trouble swallowing, dental problem, postnasal drip and sinus pressure.   Eyes: Negative for redness and itching.  Respiratory: Negative for cough, chest tightness, shortness of breath and wheezing.     Cardiovascular: Negative for palpitations and leg swelling.  Gastrointestinal: Negative for nausea and vomiting.  Genitourinary: Negative for dysuria.  Musculoskeletal: Negative for joint swelling.  Skin: Negative for rash.  Neurological: Negative for headaches.  Hematological: Does not bruise/bleed easily.  Psychiatric/Behavioral: Negative for dysphoric mood. The patient is not nervous/anxious.        Objective:   Physical Exam General:  Looks same as before but has hiccups that appear worse Eyes:  PERRLA/EOM intact; conjunctiva and sclera clear Nose:  no deformity, discharge, inflammation, or lesions Mouth:  no deformity or lesions Neck:  no masses, thyromegaly, or abnormal cervical nodes Lungs:  decreased BS bilateral and prolonged exhilation.   Heart:  regular rate and rhythm, S1, S2 without murmurs, rubs, gallops, or clicks Abdomen:  soft normal bowel sounds peg tube + Msk:  no deformity or scoliosis noted with normal posture Pulses:  pulses normal Extremities:  no clubbing, cyanosis, edema, or deformity noted Neurologic:  CN II-XII grossly intact with normal reflexes, coordination, muscle strength and tone Skin:  intact without lesions or rashes        Assessment & Plan:

## 2011-11-24 NOTE — Patient Instructions (Signed)
I think your copd is acting up YOu really need to be regular with your nebulizer treatment Currently you are not taking medications correctly as I advised in sept 2012  Have atrovent generic nebulizer 4 times daily Have pulmicort 0.25mg  nebulizer twice daily USe albuterol neb as needed Please take Take prednisone 40mg  once daily x 3 days, then 30mg  once daily x 3 days, then 20mg  once daily x 3 days, then prednisone 10mg  once daily  x 3 days and stop - crush pills and take via peg tube Take doxycycline 100mg  po twice daily x 5 days; take after meals and avoid sunlight Have CXR today Return to see my nurse Tammy next 1 week  If hiccups continue at home, call Dr Loreta Ave or our office When you see Tammy you can discuss home rehab versus outpatient rehab

## 2011-12-01 ENCOUNTER — Encounter: Payer: Self-pay | Admitting: Adult Health

## 2011-12-01 ENCOUNTER — Ambulatory Visit (INDEPENDENT_AMBULATORY_CARE_PROVIDER_SITE_OTHER): Payer: Medicare Other | Admitting: Adult Health

## 2011-12-01 DIAGNOSIS — J449 Chronic obstructive pulmonary disease, unspecified: Secondary | ICD-10-CM

## 2011-12-01 NOTE — Progress Notes (Signed)
Subjective:    Patient ID: [name], male    DOB: 1919/08/16, 78 y.o.   MRN: [id number]  HPI Problem List 1.  COPD Gold stage 2- s/p rehab compleetion Aug 2011. Rx spiriva -No desaturation walking 185 feet x 3 laps in Oct 2011 - New exertional hypoxema - May 2012; commenced on O2  - Non compliance with RX 2012 onwards  - Spirometry fev1 0.88L/36% and worst ever (non compliant with rx) 2. LUL Nodule - pseudotumor -, 2-3cm, PET negative March 2011.  3. h of LLL necrotixing pna and dysphagia/peg tube - Summer 2010, Cleared March 201 4. Chronic PEG feed due to ? Achalasia - fu with Dr Loreta Ave 5. Body mass index is 21.27 kg/(m^2). 6. AECOPD hx  - July 2012 - opd RX  OV 11/24/2011 Last visit sept 2012. Now returns 1 month earlier than usual because Dr Loreta Ave of GI had seen him yesterday and felt he was more dyspneic. Patient here with with wife. Tells me that since last office visit in December 2012 has had progressive dyspnea on exertion. Now dyspneic walking room to room . Relieved by rest. Associtaed tiredness present. Not taking nebulizer Rx as prescribed; hardly compliant. PEr wife uses o2 compliantly. Not attended rehab due to winter weather. Has baseline hiccups and some cough related to PEG tube; this is unchanged. Denies weight loss  Walking desaturation test 11/24/2011 185 feet x 3 laps: desaturated at first lap to 88%. Stopped due to severe hiccups.  Spirometry today - gold stage 4 copd; fev1 0.88L/36% and ratio 43  - significantly worse than few years ago >>tx w/ ABX-Doxycycline  and steroid taper   12/01/2011 Follow up  Patient returns for a followup visit. Last visit had a COPD exacerbation. He was treated with doxycycline and a steroid taper. He returns today accompanied by his wife saying that he is feeling much better with decreased cough and congestion. Has had last hiccups over the last couple days. We discussed pulmonary rehabilitation, and he wishes to restart back at the  rehabilitation center information was given to the patient. He denies any hemoptysis, chest pain, orthopnea, or increased leg swelling. Chest x-ray last visit showed chronic changes, without any acute process.   Review of Systems Constitutional:   No  weight loss, night sweats,  Fevers, chills,  +fatigue, or  lassitude.  HEENT:   No headaches,  Difficulty swallowing,  Tooth/dental problems, or  Sore throat,                No sneezing, itching, ear ache, nasal congestion, post nasal drip,   CV:  No chest pain,  Orthopnea, PND, swelling in lower extremities, anasarca, dizziness, palpitations, syncope.   GI  No heartburn, indigestion, abdominal pain, nausea, vomiting, diarrhea, change in bowel habits, loss of appetite, bloody stools.   Resp:    No coughing up of blood.    No chest wall deformity  Skin: no rash or lesions.  GU: no dysuria, change in color of urine, no urgency or frequency.  No flank pain, no hematuria   MS:  No joint pain or swelling.  No decreased range of motion.  No back pain.  Psych:  No change in mood or affect. No depression or anxiety.  No memory loss.         Objective:   Physical Exam GEN: A/Ox3; pleasant , NAD, elderly , frail   HEENT:  [place]/AT,  EACs-clear, TMs-wnl, NOSE-clear, THROAT-clear, no lesions, no postnasal drip or  exudate noted.   NECK:  Supple w/ fair ROM; no JVD; normal carotid impulses w/o bruits; no thyromegaly or nodules palpated; no lymphadenopathy.  RESP  Coarse BS , diminished in bases, no accessory muscle use, no dullness to percussion  CARD:  RRR, no m/r/g  , no peripheral edema, pulses intact, no cyanosis or clubbing.  GI:   Soft & nt; nml bowel sounds; no organomegaly or masses detected.  Musco: Warm bil, no deformities or joint swelling noted.   Neuro: alert, no focal deficits noted.    Skin: Warm, no lesions or rashes         Assessment & Plan:

## 2011-12-01 NOTE — Assessment & Plan Note (Signed)
Reason exacerbation, now resolved. Patient's continue his current regimen. Taper off the prednisone as directed. Patient to follow back up with Dr. Marchelle Gearing in one month as planned in as needed.

## 2011-12-01 NOTE — Patient Instructions (Addendum)
Continue on current regimen.  Finish prednisone as directed.  Restart Pulmonary Rehab.  follow up Dr. Marchelle Gearing in 1 months as planned  and As needed

## 2011-12-12 ENCOUNTER — Emergency Department (HOSPITAL_COMMUNITY): Payer: Medicare Other

## 2011-12-12 ENCOUNTER — Inpatient Hospital Stay (HOSPITAL_COMMUNITY)
Admission: EM | Admit: 2011-12-12 | Discharge: 2011-12-17 | DRG: 190 | Disposition: A | Discharge status: Home / Self Care | Payer: Medicare Other | Attending: Cardiology | Admitting: Cardiology

## 2011-12-12 ENCOUNTER — Encounter (HOSPITAL_COMMUNITY): Payer: Self-pay | Admitting: *Deleted

## 2011-12-12 DIAGNOSIS — R1310 Dysphagia, unspecified: Secondary | ICD-10-CM | POA: Diagnosis present

## 2011-12-12 DIAGNOSIS — J441 Chronic obstructive pulmonary disease with (acute) exacerbation: Principal | ICD-10-CM

## 2011-12-12 DIAGNOSIS — F1011 Alcohol abuse, in remission: Secondary | ICD-10-CM | POA: Diagnosis present

## 2011-12-12 DIAGNOSIS — Z7902 Long term (current) use of antithrombotics/antiplatelets: Secondary | ICD-10-CM

## 2011-12-12 DIAGNOSIS — Z7982 Long term (current) use of aspirin: Secondary | ICD-10-CM

## 2011-12-12 DIAGNOSIS — I2510 Atherosclerotic heart disease of native coronary artery without angina pectoris: Secondary | ICD-10-CM | POA: Diagnosis present

## 2011-12-12 DIAGNOSIS — IMO0002 Reserved for concepts with insufficient information to code with codable children: Secondary | ICD-10-CM

## 2011-12-12 DIAGNOSIS — N289 Disorder of kidney and ureter, unspecified: Secondary | ICD-10-CM | POA: Diagnosis present

## 2011-12-12 DIAGNOSIS — I10 Essential (primary) hypertension: Secondary | ICD-10-CM | POA: Diagnosis present

## 2011-12-12 DIAGNOSIS — E46 Unspecified protein-calorie malnutrition: Secondary | ICD-10-CM | POA: Diagnosis present

## 2011-12-12 DIAGNOSIS — D649 Anemia, unspecified: Secondary | ICD-10-CM | POA: Diagnosis present

## 2011-12-12 DIAGNOSIS — R066 Hiccough: Secondary | ICD-10-CM | POA: Diagnosis present

## 2011-12-12 DIAGNOSIS — R05 Cough: Secondary | ICD-10-CM | POA: Diagnosis present

## 2011-12-12 DIAGNOSIS — I252 Old myocardial infarction: Secondary | ICD-10-CM

## 2011-12-12 DIAGNOSIS — J9819 Other pulmonary collapse: Secondary | ICD-10-CM | POA: Diagnosis present

## 2011-12-12 DIAGNOSIS — Z23 Encounter for immunization: Secondary | ICD-10-CM

## 2011-12-12 DIAGNOSIS — F17200 Nicotine dependence, unspecified, uncomplicated: Secondary | ICD-10-CM | POA: Diagnosis present

## 2011-12-12 DIAGNOSIS — E7800 Pure hypercholesterolemia, unspecified: Secondary | ICD-10-CM | POA: Diagnosis present

## 2011-12-12 DIAGNOSIS — E871 Hypo-osmolality and hyponatremia: Secondary | ICD-10-CM | POA: Diagnosis present

## 2011-12-12 DIAGNOSIS — R059 Cough, unspecified: Secondary | ICD-10-CM | POA: Diagnosis present

## 2011-12-12 DIAGNOSIS — Z79899 Other long term (current) drug therapy: Secondary | ICD-10-CM

## 2011-12-12 DIAGNOSIS — J189 Pneumonia, unspecified organism: Secondary | ICD-10-CM | POA: Diagnosis present

## 2011-12-12 DIAGNOSIS — K219 Gastro-esophageal reflux disease without esophagitis: Secondary | ICD-10-CM | POA: Diagnosis present

## 2011-12-12 DIAGNOSIS — Z9861 Coronary angioplasty status: Secondary | ICD-10-CM

## 2011-12-12 LAB — COMPREHENSIVE METABOLIC PANEL
AST: 27 U/L (ref 0–37)
Albumin: 2.7 g/dL — ABNORMAL LOW (ref 3.5–5.2)
Calcium: 8.5 mg/dL (ref 8.4–10.5)
Creatinine, Ser: 0.79 mg/dL (ref 0.50–1.35)
Total Protein: 6.5 g/dL (ref 6.0–8.3)

## 2011-12-12 LAB — CBC
MCH: 30.8 pg (ref 26.0–34.0)
MCH: 30.8 pg (ref 26.0–34.0)
MCHC: 34.8 g/dL (ref 30.0–36.0)
MCV: 88.4 fL (ref 78.0–100.0)
MCV: 88.3 fL (ref 78.0–100.0)
Platelets: 139 10*3/uL — ABNORMAL LOW (ref 150–400)
Platelets: 150 10*3/uL (ref 150–400)
RBC: 4.58 MIL/uL (ref 4.22–5.81)
RDW: 13.4 % (ref 11.5–15.5)

## 2011-12-12 LAB — DIFFERENTIAL
Eosinophils Absolute: 0 10*3/uL (ref 0.0–0.7)
Eosinophils Absolute: 0 10*3/uL (ref 0.0–0.7)
Eosinophils Relative: 1 % (ref 0–5)
Lymphocytes Relative: 5 % — ABNORMAL LOW (ref 12–46)
Lymphs Abs: 0.5 10*3/uL — ABNORMAL LOW (ref 0.7–4.0)
Lymphs Abs: 0.2 10*3/uL — ABNORMAL LOW (ref 0.7–4.0)
Monocytes Relative: 13 % — ABNORMAL HIGH (ref 3–12)
Neutro Abs: 3.4 10*3/uL (ref 1.7–7.7)
Neutrophils Relative %: 93 % — ABNORMAL HIGH (ref 43–77)

## 2011-12-12 LAB — EXPECTORATED SPUTUM ASSESSMENT W GRAM STAIN, RFLX TO RESP C

## 2011-12-12 LAB — BASIC METABOLIC PANEL
BUN: 20 mg/dL (ref 6–23)
Calcium: 8.5 mg/dL (ref 8.4–10.5)
GFR calc non Af Amer: 76 mL/min — ABNORMAL LOW (ref >90)
Glucose, Bld: 106 mg/dL — ABNORMAL HIGH (ref 70–99)
Sodium: 127 mEq/L — ABNORMAL LOW (ref 135–145)

## 2011-12-12 MED ORDER — LEVALBUTEROL HCL 1.25 MG/0.5ML IN NEBU
1.2500 mg | INHALATION_SOLUTION | Freq: Three times a day (TID) | RESPIRATORY_TRACT | Status: DC
  Administered 2011-12-13 – 2011-12-16 (×11): 1.25 mg via RESPIRATORY_TRACT
  Filled 2011-12-12 (×17): qty 0.5

## 2011-12-12 MED ORDER — DEXTROSE 5 % IV SOLN
500.0000 mg | INTRAVENOUS | Status: DC
  Administered 2011-12-12 – 2011-12-16 (×5): 500 mg via INTRAVENOUS
  Filled 2011-12-12 (×8): qty 500

## 2011-12-12 MED ORDER — ALBUTEROL SULFATE (5 MG/ML) 0.5% IN NEBU
5.0000 mg | INHALATION_SOLUTION | Freq: Once | RESPIRATORY_TRACT | Status: AC
  Administered 2011-12-12: 5 mg via RESPIRATORY_TRACT
  Filled 2011-12-12: qty 1

## 2011-12-12 MED ORDER — BACLOFEN 1 MG/ML ORAL SUSPENSION: 10.0000 mg | Freq: Three times a day (TID) | ORAL | Status: DC | PRN

## 2011-12-12 MED ORDER — SODIUM CHLORIDE 0.9 % IV SOLN
Freq: Once | INTRAVENOUS | Status: AC
  Administered 2011-12-12: 17:00:00 via INTRAVENOUS

## 2011-12-12 MED ORDER — POTASSIUM CHLORIDE IN NACL 20-0.9 MEQ/L-% IV SOLN: Freq: Once | INTRAVENOUS | Status: DC

## 2011-12-12 MED ORDER — JEVITY PLUS PO LIQD: 300.0000 mL | Freq: Every day | ORAL | Status: DC

## 2011-12-12 MED ORDER — JEVITY 1.2 CAL PO LIQD
300.0000 mL | Freq: Every day | ORAL | Status: DC
  Administered 2011-12-12: 237 mL
  Administered 2011-12-13: 300 mL
  Administered 2011-12-13: 237 mL
  Administered 2011-12-13 – 2011-12-17 (×19): 300 mL
  Filled 2011-12-12 (×33): qty 474

## 2011-12-12 MED ORDER — DEXTROSE 5 % IV SOLN
500.0000 mg | Freq: Once | INTRAVENOUS | Status: DC
  Filled 2011-12-12: qty 500

## 2011-12-12 MED ORDER — BIOTENE DRY MOUTH MT LIQD
15.0000 mL | Freq: Two times a day (BID) | OROMUCOSAL | Status: DC
  Administered 2011-12-13 – 2011-12-17 (×5): 15 mL via OROMUCOSAL

## 2011-12-12 MED ORDER — IPRATROPIUM BROMIDE 0.02 % IN SOLN
0.5000 mg | Freq: Once | RESPIRATORY_TRACT | Status: AC
  Administered 2011-12-12: 0.5 mg via RESPIRATORY_TRACT
  Filled 2011-12-12: qty 2.5

## 2011-12-12 MED ORDER — BACLOFEN 10 MG PO TABS
10.0000 mg | ORAL_TABLET | Freq: Three times a day (TID) | ORAL | Status: DC
  Administered 2011-12-12 – 2011-12-17 (×14): 10 mg
  Filled 2011-12-12 (×16): qty 1

## 2011-12-12 MED ORDER — FERROUS SULFATE 325 (65 FE) MG PO TABS
325.0000 mg | ORAL_TABLET | Freq: Two times a day (BID) | ORAL | Status: DC
  Administered 2011-12-13 – 2011-12-17 (×9): 325 mg via ORAL
  Filled 2011-12-12 (×12): qty 1

## 2011-12-12 MED ORDER — ENOXAPARIN SODIUM 40 MG/0.4ML ~~LOC~~ SOLN
40.0000 mg | SUBCUTANEOUS | Status: DC
  Administered 2011-12-12 – 2011-12-16 (×5): 40 mg via SUBCUTANEOUS
  Filled 2011-12-12 (×6): qty 0.4

## 2011-12-12 MED ORDER — DEXTROSE 5 % IV SOLN
1.0000 g | Freq: Once | INTRAVENOUS | Status: AC
  Administered 2011-12-12: 1 g via INTRAVENOUS
  Filled 2011-12-12: qty 10

## 2011-12-12 MED ORDER — ENOXAPARIN SODIUM 30 MG/0.3ML ~~LOC~~ SOLN
30.0000 mg | SUBCUTANEOUS | Status: DC
  Filled 2011-12-12: qty 0.3

## 2011-12-12 MED ORDER — ASPIRIN 81 MG PO CHEW
81.0000 mg | CHEWABLE_TABLET | Freq: Every day | ORAL | Status: DC
  Administered 2011-12-13 – 2011-12-17 (×5): 81 mg
  Filled 2011-12-12 (×5): qty 1

## 2011-12-12 MED ORDER — SODIUM CHLORIDE 0.9 % IV SOLN
INTRAVENOUS | Status: DC
  Administered 2011-12-17: 10 mL/h via INTRAVENOUS

## 2011-12-12 MED ORDER — ASPIRIN 81 MG PO TABS: 81.0000 mg | ORAL_TABLET | Freq: Every day | ORAL | Status: DC

## 2011-12-12 MED ORDER — LOSARTAN POTASSIUM 50 MG PO TABS
50.0000 mg | ORAL_TABLET | Freq: Two times a day (BID) | ORAL | Status: DC
  Administered 2011-12-12 – 2011-12-17 (×10): 50 mg
  Filled 2011-12-12 (×11): qty 1

## 2011-12-12 MED ORDER — CARVEDILOL 3.125 MG PO TABS
3.1250 mg | ORAL_TABLET | Freq: Every day | ORAL | Status: DC
  Administered 2011-12-13: 3.125 mg
  Filled 2011-12-12 (×2): qty 1

## 2011-12-12 MED ORDER — CLOPIDOGREL BISULFATE 75 MG PO TABS
75.0000 mg | ORAL_TABLET | Freq: Every day | ORAL | Status: DC
  Administered 2011-12-13 – 2011-12-17 (×5): 75 mg
  Filled 2011-12-12 (×6): qty 1

## 2011-12-12 MED ORDER — PANTOPRAZOLE SODIUM 40 MG PO TBEC
40.0000 mg | DELAYED_RELEASE_TABLET | Freq: Every day | ORAL | Status: DC
  Administered 2011-12-13 – 2011-12-17 (×4): 40 mg via ORAL
  Filled 2011-12-12 (×5): qty 1

## 2011-12-12 MED ORDER — PREDNISONE 20 MG PO TABS
60.0000 mg | ORAL_TABLET | Freq: Once | ORAL | Status: AC
  Administered 2011-12-12: 60 mg via ORAL
  Filled 2011-12-12: qty 3

## 2011-12-12 MED ORDER — DEXTROSE 5 % IV SOLN
1.0000 g | INTRAVENOUS | Status: DC
  Administered 2011-12-13 – 2011-12-16 (×4): 1 g via INTRAVENOUS
  Filled 2011-12-12 (×6): qty 10

## 2011-12-12 MED ORDER — CHLORHEXIDINE GLUCONATE 0.12 % MT SOLN
15.0000 mL | Freq: Two times a day (BID) | OROMUCOSAL | Status: DC
  Administered 2011-12-12 – 2011-12-17 (×9): 15 mL via OROMUCOSAL
  Filled 2011-12-12 (×12): qty 15

## 2011-12-12 MED ORDER — SILODOSIN 8 MG PO CAPS
8.0000 mg | ORAL_CAPSULE | Freq: Every day | ORAL | Status: DC
  Administered 2011-12-13 – 2011-12-17 (×5): 8 mg via ORAL
  Filled 2011-12-12 (×6): qty 1

## 2011-12-12 MED ORDER — BACLOFEN 10 MG PO TABS
10.0000 mg | ORAL_TABLET | Freq: Three times a day (TID) | ORAL | Status: DC | PRN
  Administered 2011-12-13 – 2011-12-17 (×3): 10 mg
  Filled 2011-12-12 (×3): qty 1

## 2011-12-12 MED ORDER — PNEUMOCOCCAL VAC POLYVALENT 25 MCG/0.5ML IJ INJ
0.5000 mL | INJECTION | INTRAMUSCULAR | Status: AC
  Administered 2011-12-13: 0.5 mL via INTRAMUSCULAR
  Filled 2011-12-12: qty 0.5
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
## 2011-12-12 NOTE — Progress Notes (Addendum)
MEDICATION RELATED CONSULT NOTE - INITIAL   Pharmacy Consult for Renal dose adjustments  No Known Allergies  Patient Measurements:  Pt wt: 65.6 kg  Vital Signs: Temp: 98.1 F (36.7 C) (04/07 1709) Temp src: Oral (04/07 1358) BP: 142/94 mmHg (04/07 1709) Pulse Rate: 70  (04/07 1700) Intake/Output from previous day:   Intake/Output from this shift:    Labs:  Basename 12/12/11 1421  WBC 4.3  HGB 14.1  HCT 40.5  PLT 139*  APTT --  CREATININE 0.79  LABCREA --  CREATININE 0.79  CREAT24HRUR --  MG --  PHOS --  ALBUMIN --  PROT --  ALBUMIN --  AST --  ALT --  ALKPHOS --  BILITOT --  BILIDIR --  IBILI --   Estimated CrCl ~ 80 ml/min  Microbiology: No results found for this or any previous visit (from the past 720 hour(s)).  Medical History: Past Medical History  Diagnosis Date  . Necrotizing pneumonia 03-20-2009  . Nodule of left lung   . Nodule of right lung     resolved  . HTN (hypertension)   . Esophageal stricture   . Dysphagia   . Hiccups   . Chronic atrial fibrillation   . Anemia   . COPD (chronic obstructive pulmonary disease)   . Coronary heart disease   . Hiatal hernia   . Heart attack 09-2007  . Abdominal aneurysm     Medications:  Scheduled:    . sodium chloride   Intravenous Once  . albuterol  5 mg Nebulization Once  . albuterol  5 mg Nebulization Once  . aspirin  81 mg Per Tube Daily  . azithromycin  500 mg Intravenous Q24H  . baclofen  10 mg Per Tube TID  . carvedilol  3.125 mg Per Tube Q breakfast  . cefTRIAXone (ROCEPHIN)  IV  1 g Intravenous Once  . cefTRIAXone (ROCEPHIN)  IV  1 g Intravenous Q24H  . clopidogrel  75 mg Per Tube QAC breakfast  . enoxaparin  40 mg Subcutaneous Q24H  . feeding supplement (JEVITY 1.2 CAL)  300 mL Per Tube 5 X Daily  . ferrous sulfate  325 mg Oral BID WC  . ipratropium  0.5 mg Nebulization Once  . levalbuterol  1.25 mg Nebulization TID  . losartan  50 mg Per Tube BID  . pantoprazole  40 mg Oral  Daily  . predniSONE  60 mg Oral Once  . silodosin  8 mg Oral Q breakfast  . DISCONTD: 0.9 % NaCl with KCl 20 mEq / L   Intravenous Once  . DISCONTD: aspirin  81 mg Per Tube Daily  . DISCONTD: azithromycin (ZITHROMAX) 500 MG IVPB  500 mg Intravenous Once  . DISCONTD: enoxaparin  30 mg Subcutaneous Q24H  . DISCONTD: Jevity Plus  300 mL Gastric Tube 5 X Daily    Assessment: 78 yo M admitted with CAP started on Rocephin and Zithromax.  Pt appears to have normal renal function. Neither of these antibiotics renal renal dose adjustments.    Plan:  1) Continue Rocephin & Zithromax per MD orders 2) Increase Lovenox 40mg  sq daily for CrCl>30. 3) Pharmacy to sign off. Please reconsult as needed.  Elson Clan 12/12/2011,6:34 PM

## 2011-12-12 NOTE — ED Notes (Signed)
Prednisone crushed and given via peg tube

## 2011-12-12 NOTE — ED Notes (Signed)
Pt SOB x 1week and spitting up a lot.

## 2011-12-12 NOTE — Progress Notes (Signed)
[name] [id number] Unit arrival: 12/12/2011 1815 Attending Provider: Robynn Pane, MD  WUJ:[email or web address],WGNFAO O, MD, MD  [name] is a 78 y.o. male patient admitted from ED awake, alert  & orientated  X 3,  Full Code, VSS - Blood pressure 151/75, pulse 73, temperature 98.5 F (36.9 C), temperature source Oral, resp. rate 18, height 5\' 9"  (1.753 m), weight 65.76 kg (144 lb 15.6 oz), SpO2 96.00%., O2    2L nasal cannular, no c/o shortness of breath, no c/o chest pain, no distress noted. Tele [id number] placed and pt is currently running:normal sinus rhythm, frequent premature ventricular beats.   IV site WDL:  antecubital right, condition patent and no redness with a transparent dsg that's clean dry and intact.  Allergies:  No Known Allergies   Past Medical History  Diagnosis Date  . Necrotizing pneumonia 03-20-2009  . Nodule of left lung   . Nodule of right lung     resolved  . HTN (hypertension)   . Esophageal stricture   . Dysphagia   . Hiccups   . Chronic atrial fibrillation   . Anemia   . COPD (chronic obstructive pulmonary disease)   . Coronary heart disease   . Hiatal hernia   . Heart attack 09-2007  . Abdominal aneurysm     Pt orientation to unit, room and routine. Information packet given to patient/family and safety video watched.  Admission INP armband ID verified with patient/family, and in place. SR up x 2, fall risk assessment complete with Patient and family verbalizing understanding of risks associated with falls. Pt verbalizes an understanding of how to use the call bell and to call for help before getting out of bed.  Skin, clean-dry- intact without evidence of bruising, or skin tears.   No evidence of skin break down noted on exam.    Will cont to monitor and assist as needed.  Julien Nordmann [facility], RN 12/12/2011 6:51 PM

## 2011-12-12 NOTE — ED Provider Notes (Addendum)
History     CSN: [id number]  Arrival date & time 12/12/11  1355   First MD Initiated Contact with Patient 12/12/11 1411      Chief Complaint  Patient presents with  . Shortness of Breath    (Consider location/radiation/quality/duration/timing/severity/associated sxs/prior treatment) HPI Comments: Patient with known history of COPD with home oxygen requirement of 2 L.  Over the last several days the patient has had increased cough with sputum production.  He's noted increasing shortness of breath with even walking short distances across the room.  Patient has increased his home oxygen without relief of his symptoms.  No specific fevers at home.  No nausea or vomiting.  No leg swelling.  No chest pain or chest pressure.  Patient is a 78 y.o. male presenting with shortness of breath. The history is provided by the patient and the spouse. No language interpreter was used.  Shortness of Breath  The current episode started 3 to 5 days ago. The onset was gradual. The problem occurs frequently. The problem has been gradually worsening. The problem is severe. The symptoms are relieved by nothing. The symptoms are aggravated by activity. Associated symptoms include cough, shortness of breath and wheezing. Pertinent negatives include no chest pain, no chest pressure, no fever and no rhinorrhea.    Past Medical History  Diagnosis Date  . Necrotizing pneumonia 03-20-2009  . Nodule of left lung   . Nodule of right lung     resolved  . HTN (hypertension)   . Esophageal stricture   . Dysphagia   . Hiccups   . Chronic atrial fibrillation   . Anemia   . COPD (chronic obstructive pulmonary disease)   . Coronary heart disease   . Hiatal hernia   . Heart attack 09-2007  . Abdominal aneurysm     Past Surgical History  Procedure Date  . Eye surgery 2003    right eye macular hole repair  . Coronary stent placement     x2    Family History  Problem Relation Age of Onset  . Breast cancer Sister    . Cancer Sister   . Cancer Brother   . Cancer Brother   . Cancer Brother     History  Substance Use Topics  . Smoking status: Former Smoker -- 1.0 packs/day for 30 years    Types: Cigarettes    Quit date: 09/06/2004  . Smokeless tobacco: Never Used  . Alcohol Use: No     hx of ETOH abuse, quit x 2yrs ago      Review of Systems  Constitutional: Negative.  Negative for fever and chills.  HENT: Negative.  Negative for rhinorrhea.   Eyes: Negative.  Negative for discharge and redness.  Respiratory: Positive for cough, shortness of breath and wheezing.   Cardiovascular: Negative.  Negative for chest pain.  Gastrointestinal: Negative.  Negative for nausea, vomiting and abdominal pain.  Genitourinary: Negative.  Negative for hematuria.  Musculoskeletal: Negative.  Negative for back pain.  Skin: Negative.  Negative for color change and rash.  Neurological: Negative for syncope and headaches.  Hematological: Negative.  Negative for adenopathy.  Psychiatric/Behavioral: Negative.  Negative for confusion.  All other systems reviewed and are negative.    Allergies  Review of patient's allergies indicates no known allergies.  Home Medications   Current Outpatient Rx  Name Route Sig Dispense Refill  . ACETAMINOPHEN-CODEINE 300-60 MG PO TABS PEG Tube 1 tablet by PEG Tube route every 4 (four) hours as needed.     Marland Kitchen  ALBUTEROL SULFATE (2.5 MG/3ML) 0.083% IN NEBU Nebulization Take 2.5 mg by nebulization every 6 (six) hours as needed.    . ASPIRIN 81 MG PO TABS PEG Tube 81 mg by PEG Tube route daily.    Marland Kitchen BACLOFEN 10 MG PO TABS PEG Tube 10 mg by PEG Tube route 3 (three) times daily.     . BUDESONIDE 0.5 MG/2ML IN SUSP Nebulization Take 0.5 mg by nebulization 2 (two) times daily.    Marland Kitchen CARVEDILOL 3.125 MG PO TABS PEG Tube 3.125 mg by PEG Tube route daily.    Marland Kitchen CLOPIDOGREL BISULFATE 75 MG PO TABS PEG Tube 75 mg by PEG Tube route daily. Takes every other day    . ESOMEPRAZOLE MAGNESIUM 40  MG PO CPDR PEG Tube 40 mg by PEG Tube route daily before breakfast.      . FERROUS SULFATE DRIED ER 160 (50 FE) MG PO TBCR PEG Tube 1 tablet by PEG Tube route 2 (two) times daily.     . IPRATROPIUM BROMIDE 0.02 % IN SOLN Nebulization Take 500 mcg by nebulization 4 (four) times daily.    Marland Kitchen LOSARTAN POTASSIUM 50 MG PO TABS PEG Tube 50 mg by PEG Tube route 2 (two) times daily.    Marland Kitchen JEVITY PLUS PO LIQD  at 7am, 11am, 3pm, 7pm, and 10pm. Each time tube flushes past tube feeds     . PREDNISONE 10 MG PO TABS  Take 4 tablets daily x 3days, then 3 tablets daily x 3days, then 2 tablets daily x 3days, then 1 tablet daily x 3days then stop. Crush pills and take via peg tube. 30 tablet 0  . SILODOSIN 8 MG PO CAPS Oral Take 8 mg by mouth daily with breakfast.        BP 186/101  Pulse 86  Temp(Src) 98.6 F (37 C) (Oral)  Resp 20  SpO2 97%  Physical Exam  Nursing note and vitals reviewed. Constitutional: He is oriented to person, place, and time. He appears well-developed and well-nourished.  Non-toxic appearance. He does not have a sickly appearance.  HENT:  Head: Normocephalic and atraumatic.  Eyes: Conjunctivae, EOM and lids are normal. Pupils are equal, round, and reactive to light.  Neck: Trachea normal, normal range of motion and full passive range of motion without pain. Neck supple.  Cardiovascular: Normal rate, regular rhythm and normal heart sounds.   Pulmonary/Chest: Effort normal. No respiratory distress. He has wheezes.       Patient with diffuse inspiratory and expiratory wheezing with rhonchi present on exam  Abdominal: Soft. Normal appearance. He exhibits no distension. There is no tenderness. There is no rebound and no CVA tenderness.  Musculoskeletal: Normal range of motion. He exhibits no edema.  Neurological: He is alert and oriented to person, place, and time. He has normal strength.  Skin: Skin is warm, dry and intact. No rash noted.  Psychiatric: He has a normal mood  and affect. His behavior is normal. Judgment and thought content normal.    ED Course  Procedures (including critical care time)   Labs Reviewed  CBC  DIFFERENTIAL  BASIC METABOLIC PANEL  CARDIAC PANEL(CRET KIN+CKTOT+MB+TROPI)   No results found.   No diagnosis found.   Date: 12/12/2011  Rate: 74  Rhythm: normal sinus rhythm  QRS Axis: normal  Intervals: normal  ST/T Wave abnormalities: normal  Conduction Disutrbances:right bundle branch block  Narrative Interpretation:   Old EKG Reviewed: changes noted from 03/30/2011 when patient had right bundle branch  block but also had T wave inversions in the inferior leads that are not present today    MDM  Patient with COPD exacerbation clinically.  I will obtain laboratory studies and a chest x-ray to assess for the possibility of pneumonia given the increased sputum production.  I anticipate that the patient will require admission given his decreased ability to ambulate even short distances around his house and his baseline oxygen requirement with his worsening respiratory status.        Nat Christen, MD 12/12/11 1423  Nat Christen, MD 12/12/11 [phone]

## 2011-12-13 LAB — BASIC METABOLIC PANEL
CO2: 26 mEq/L (ref 19–32)
Chloride: 94 mEq/L — ABNORMAL LOW (ref 96–112)
Creatinine, Ser: 0.82 mg/dL (ref 0.50–1.35)

## 2011-12-13 LAB — TSH: TSH: 1.006 u[IU]/mL (ref 0.350–4.500)

## 2011-12-13 LAB — LEGIONELLA ANTIGEN, URINE: Legionella Antigen, Urine: NEGATIVE

## 2011-12-13 LAB — CBC
HCT: 42.5 % (ref 39.0–52.0)
MCV: 90 fL (ref 78.0–100.0)
Platelets: 156 10*3/uL (ref 150–400)
RBC: 4.72 MIL/uL (ref 4.22–5.81)
WBC: 3.7 10*3/uL — ABNORMAL LOW (ref 4.0–10.5)

## 2011-12-13 NOTE — Evaluation (Signed)
Physical Therapy Evaluation Patient Details Name: [name] MRN: [id number] DOB: 1918/09/08 Today's Date: 12/13/2011  Problem List:  Patient Active Problem List  Diagnoses  . HYPERTENSION  . CORONARY HEART DISEASE  . ATRIAL FIBRILLATION  . COPD, severe  . Abscess of lung  . Other diseases of lung, not elsewhere classified  . OTHER DYSPHAGIA  . Hiccups  . Acute exacerbation of chronic obstructive pulmonary disease (COPD)    Past Medical History:  Past Medical History  Diagnosis Date  . Necrotizing pneumonia 03-20-2009  . Nodule of left lung   . Nodule of right lung     resolved  . HTN (hypertension)   . Esophageal stricture   . Dysphagia   . Hiccups   . Chronic atrial fibrillation   . Anemia   . COPD (chronic obstructive pulmonary disease)   . Coronary heart disease   . Hiatal hernia   . Heart attack 09-2007  . Abdominal aneurysm    Past Surgical History:  Past Surgical History  Procedure Date  . Eye surgery 2003    right eye macular hole repair  . Coronary stent placement     x2    PT Assessment/Plan/Recommendation PT Assessment Clinical Impression Statement: Mr. Shaddix presents to [facility] with COBD exacerbation. PT consulted for mobility. He presents with the below problem list during PT eval and will benefit physical therapy in the acute setting to address these as well as to maximize strength and activity tolerance for safe mobilty and d/c home. Rec. HHPT for f/u therapies and RW.  Ed pt on the benefits of mobility for overall health and safety, encouraged him to ambulate daily with nsg as well as to perform HEP.  PT Recommendation/Assessment: Patient will need skilled PT in the acute care venue PT Problem List: Decreased strength;Decreased activity tolerance;Decreased balance;Decreased mobility;Decreased knowledge of use of DME;Cardiopulmonary status limiting activity PT Therapy Diagnosis : Difficulty walking;Abnormality of gait;Generalized weakness PT Plan PT  Frequency: Min 3X/week PT Treatment/Interventions: DME instruction;Gait training;Stair training;Functional mobility training;Therapeutic activities;Therapeutic exercise;Neuromuscular re-education;Cognitive remediation;Patient/family education PT Recommendation Recommendations for Other Services: OT consult Follow Up Recommendations: Home health PT and Supervision for mobility/OOB Equipment Recommended: Rolling walker with 5" wheels PT Goals  Acute Rehab PT Goals PT Goal Formulation: With patient Time For Goal Achievement: 2 weeks Pt will go Supine/Side to Sit: Independently PT Goal: Supine/Side to Sit - Progress: Goal set today Pt will go Sit to Supine/Side: Independently PT Goal: Sit to Supine/Side - Progress: Goal set today Pt will go Sit to Stand: with modified independence PT Goal: Sit to Stand - Progress: Goal set today Pt will go Stand to Sit: with modified independence PT Goal: Stand to Sit - Progress: Goal set today Pt will Transfer Bed to Chair/Chair to Bed: with modified independence PT Transfer Goal: Bed to Chair/Chair to Bed - Progress: Goal set today Pt will Stand: with modified independence;3 - 5 min;with no upper extremity support PT Goal: Stand - Progress: Goal set today Pt will Ambulate: with least restrictive assistive device;with modified independence;51 - 150 feet PT Goal: Ambulate - Progress: Goal set today Pt will Go Up / Down Stairs: 1-2 stairs;with supervision;with least restrictive assistive device PT Goal: Up/Down Stairs - Progress: Goal set today Pt will Perform Home Exercise Program: Independently PT Goal: Perform Home Exercise Program - Progress: Goal set today  PT Evaluation Precautions/Restrictions  Precautions Precautions: Fall Restrictions Weight Bearing Restrictions: No Prior Functioning  Home Living Lives With: Spouse Receives Help From: Family Type  of Home: House Home Layout: One level Home Access: Stairs to enter Entrance Stairs-Rails:  Right Entrance Stairs-Number of Steps: 2 Home Adaptive Equipment: None Prior Function Level of Independence: Independent with basic ADLs;Independent with gait;Independent with transfers (wife does house work) Comments: fed through a peg tube; wife reports pt is rather sedentary at home Cognition Cognition Arousal/Alertness: Awake/alert Overall Cognitive Status: Appears within functional limits for tasks assessed Sensation/Coordination Sensation Light Touch: Appears Intact Coordination Gross Motor Movements are Fluid and Coordinated: Yes Fine Motor Movements are Fluid and Coordinated: Yes Extremity Assessment RUE Assessment RUE Assessment:  (grossly 4/5) LUE Assessment LUE Assessment:  (grossly 4/5) RLE Assessment RLE Assessment:  (grossly 4/5; generally weak) LLE Assessment LLE Assessment:  (grossly 4/5 generally weak) Mobility (including Balance) Bed Mobility Bed Mobility: Yes Supine to Sit: 4: Min assist;With rails;HOB elevated (Comment degrees) (45 degrees) Supine to Sit Details (indicate cue type and reason): pt slow to initiate trunk flexion cues for technique Sitting - Scoot to Edge of Bed: 5: Supervision Transfers Transfers: Yes Sit to Stand: From bed;With upper extremity assist (mingaurdA) Sit to Stand Details (indicate cue type and reason): pt slow to rise; close gaurding secondary to generalized weakness; pt reaching out for unstable environmental supports; cues for safety with this Stand to Sit: To chair/3-in-1;With upper extremity assist;With armrests (mingaurdA) Stand to Sit Details: cues for safe hand placement and to control descent Ambulation/Gait Ambulation/Gait: Yes Ambulation/Gait Assistance: 4: Min assist Ambulation/Gait Assistance Details (indicate cue type and reason): amb approx 6 ft with RUE supported by unstable oxygen tank regardless of cues to reach for my hand; slow shuffled and crounched gait almost Ambulation Distance (Feet): 6 Feet Assistive  device: None  Balance Balance Assessed: Yes Static Standing Balance Static Standing - Balance Support: Right upper extremity supported Static Standing - Level of Assistance: 4: Min assist Static Standing - Comment/# of Minutes: mingaurdA for standing with RUE supported pt obviously weak with flexed knees and decreased tolerance for standing Exercise  General Exercises - Lower Extremity Ankle Circles/Pumps: AROM;10 reps;Seated Long Arc Quad: AROM;Both;Seated;5 reps End of Session PT - End of Session Equipment Utilized During Treatment: Gait belt Activity Tolerance: Patient tolerated treatment well;Treatment limited secondary to medical complications (Comment) (pt with increased DOE) Patient left: in chair;with family/visitor present;with call bell in reach Nurse Communication: Mobility status for transfers;Mobility status for ambulation General Behavior During Session: [facility] for tasks performed Cognition: [facility] for tasks performed  [facility] HELEN 12/13/2011, 5:29 PM

## 2011-12-13 NOTE — Progress Notes (Signed)
Subjective:  Patient complains of cough with a whitish yellow phlegm states breathing is improved slightly  Objective:  Vital Signs in the last 24 hours: Temp:  [98.1 F (36.7 C)-98.6 F (37 C)] 98.2 F (36.8 C) (04/08 0442) Pulse Rate:  [70-86] 71  (04/08 0442) Resp:  [18-22] 18  (04/08 0442) BP: (142-186)/(75-101) 163/85 mmHg (04/08 0442) SpO2:  [96 %-100 %] 99 % (04/08 0904) Weight:  [65.76 kg (144 lb 15.6 oz)] 65.76 kg (144 lb 15.6 oz) (04/07 1836)  Intake/Output from previous day: 04/07 0701 - 04/08 0700 In: 680  Out: 350 [Urine:350] Intake/Output from this shift:    Physical Exam: Neck: no adenopathy, no carotid bruit, no JVD and supple, symmetrical, trachea midline Lungs: Decreased breath sounds at bases with right basilar rhonchi  Heart: regular rate and rhythm, S1, S2 normal and Soft systolic murmur noted Abdomen: soft, non-tender; bowel sounds normal; no masses,  no organomegaly Extremities: extremities normal, atraumatic, no cyanosis or edema  Lab Results:  Basename 12/13/11 0630 12/12/11 1953  WBC 3.7* 3.7*  HGB 14.4 13.7  PLT 156 150    Basename 12/13/11 0630 12/12/11 1953  NA 128* 126*  K 4.9 4.6  CL 94* 91*  CO2 26 25  GLUCOSE 114* 186*  BUN 18 18  CREATININE 0.82 0.79    Basename 12/12/11 1422  TROPONINI <0.30   Hepatic Function Panel  Basename 12/12/11 1953  PROT 6.5  ALBUMIN 2.7*  AST 27  ALT 33  ALKPHOS 57  BILITOT 0.4  BILIDIR --  IBILI --   No results found for this basename: CHOL in the last 72 hours No results found for this basename: PROTIME in the last 72 hours  Imaging: Imaging results have been reviewed and Dg Chest 2 View  12/12/2011  *RADIOLOGY REPORT*  Clinical Data: Pain  CHEST - 2 VIEW  Comparison: 11/24/2011  Findings: Cardiomediastinal silhouette is stable. Bilateral emphysematous changes again noted.  Stable bullous changes especially in upper lobes.  No pulmonary edema.  There is right basilar atelectasis or early  infiltrate.  Bony thorax is stable. Nodular density in the left midlung may represent nipple shadow.  Repeat frontal view with nipple markers recommended.  IMPRESSION: Bilateral emphysematous / bullous changes again noted. Right base atelectasis or early infiltrate. Nodular density in the left midlung may represent nipple shadow.  Repeat frontal view with nipple markers recommended.  Original Report Authenticated By: Natasha Mead, M.D.    Cardiac Studies:  Assessment/Plan:  Resolving exacerbation of COPD Right lower lobe pneumonia CAD history of inferior wall myocardial infarction in the past Hypertension COPD GERD History of esophageal stricture  Dysphagia Status post PEG Hypercholesteremia History of diverticulosis Protein calorie malnutrition Tobacco abuse Hyponatremia History of mild renal insufficiency stable Plan Continue present management  PT consult  LOS: 1 day    Gawain Crombie N 12/13/2011, 11:29 AM

## 2011-12-13 NOTE — Progress Notes (Signed)
INITIAL ADULT NUTRITION ASSESSMENT Date: 12/13/2011   Time: 10:31 AM  Reason for Assessment: Nutrition Risk Report (Home TF and Problems Chewing and Swallowing)  ASSESSMENT: Male 77 y.o.  Dx: Progressive increasing shortness of breath associated with cough; COPD exacerbation  Hx:  Past Medical History  Diagnosis Date  . Necrotizing pneumonia 03-20-2009  . Nodule of left lung   . Nodule of right lung     resolved  . HTN (hypertension)   . Esophageal stricture   . Dysphagia   . Hiccups   . Chronic atrial fibrillation   . Anemia   . COPD (chronic obstructive pulmonary disease)   . Coronary heart disease   . Hiatal hernia   . Heart attack 09-2007  . Abdominal aneurysm    Past Surgical History  Procedure Date  . Eye surgery 2003    right eye macular hole repair  . Coronary stent placement     x2   Related Meds:     . sodium chloride   Intravenous Once  . albuterol  5 mg Nebulization Once  . albuterol  5 mg Nebulization Once  . antiseptic oral rinse  15 mL Mouth Rinse q12n4p  . aspirin  81 mg Per Tube Daily  . azithromycin  500 mg Intravenous Q24H  . baclofen  10 mg Per Tube TID  . carvedilol  3.125 mg Per Tube Q breakfast  . cefTRIAXone (ROCEPHIN)  IV  1 g Intravenous Once  . cefTRIAXone (ROCEPHIN)  IV  1 g Intravenous Q24H  . chlorhexidine  15 mL Mouth Rinse BID  . clopidogrel  75 mg Per Tube QAC breakfast  . enoxaparin  40 mg Subcutaneous Q24H  . feeding supplement (JEVITY 1.2 CAL)  300 mL Per Tube 5 X Daily  . ferrous sulfate  325 mg Oral BID WC  . ipratropium  0.5 mg Nebulization Once  . levalbuterol  1.25 mg Nebulization TID  . losartan  50 mg Per Tube BID  . pantoprazole  40 mg Oral Daily  . pneumococcal 23 valent vaccine  0.5 mL Intramuscular Tomorrow-1000  . predniSONE  60 mg Oral Once  . silodosin  8 mg Oral Q breakfast  . DISCONTD: 0.9 % NaCl with KCl 20 mEq / L   Intravenous Once  . DISCONTD: aspirin  81 mg Per Tube Daily  . DISCONTD: azithromycin  (ZITHROMAX) 500 MG IVPB  500 mg Intravenous Once  . DISCONTD: enoxaparin  30 mg Subcutaneous Q24H  . DISCONTD: Jevity Plus  300 mL Gastric Tube 5 X Daily   Ht: 5\' 9"  (175.3 cm)  Wt: 144 lb 15.6 oz (65.76 kg)  Ideal Wt: 72.7 kg % Ideal Wt: 91%  Wt Readings from Last 10 Encounters:  12/12/11 144 lb 15.6 oz (65.76 kg)  12/01/11 144 lb 9.6 oz (65.59 kg)  11/24/11 144 lb (65.318 kg)  08/19/11 139 lb (63.05 kg)  05/21/11 139 lb 12.8 oz (63.413 kg)  04/27/11 142 lb (64.411 kg)  01/08/11 145 lb (65.772 kg)  06/09/10 142 lb (64.411 kg)  01/23/10 138 lb (62.596 kg)  11/20/09 140 lb (63.504 kg)  Usual Wt: 138 - 140 lb % Usual Wt: 103%  Body mass index is 21.41 kg/(m^2). Weight is WNL.  Food/Nutrition Related Hx: PEG placement 3 years ago per pt  Labs:  CMP     Component Value Date/Time   NA 128* 12/13/2011 0630   K 4.9 12/13/2011 0630   CL 94* 12/13/2011 0630   CO2 26 12/13/2011  0630   GLUCOSE 114* 12/13/2011 0630   BUN 18 12/13/2011 0630   CREATININE 0.82 12/13/2011 0630   CALCIUM 8.8 12/13/2011 0630   PROT 6.5 12/12/2011 1953   ALBUMIN 2.7* 12/12/2011 1953   AST 27 12/12/2011 1953   ALT 33 12/12/2011 1953   ALKPHOS 57 12/12/2011 1953   BILITOT 0.4 12/12/2011 1953   GFRNONAA 75* 12/13/2011 0630   GFRAA 87* 12/13/2011 0630  Magnesium 1.7 WNL  CBG (last 3)  No results found for this basename: GLUCAP:3 in the last 72 hours  Intake/Output: I/O last 3 completed shifts: In: 680 [Other:680] Out: 350 [Urine:350]    Diet Order:   NPO  Supplements/Tube Feeding: Jevity 1.2 300 ml 5 times daily  IVF:    sodium chloride   Estimated Nutritional Needs:   Kcal: 1500 - 1750 kcal Protein:  60 - 72 grams Fluid:  1.7 - 1.9 L/d  Pt with notable hx of s/p PEG placement 2/2 diverticulosis and malnutrition. Hx of EtOH abuse.  Pt reports PEG use of 3 years. Typical regimen is 1.5 cans of Jevity 1.2 via PEG TID; total is 4.5 - 5 cans daily, per patient. This regimen provides 1300-1440 kcal, 60-67 grams  protein, 872-968 ml free water. Weight has been stable, with recent increase of 3-4 lb. Pt confirms stable weight. No visible wasting evident. Pt does not meet criteria for malnutrition based on current malnutrition criteria.  Current regimen of 300 cc of Jevity 1.2 5 times daily provides: 1800 kcal, 84 grams protein, 1211 ml free water. Likely exceeding needs.  RN and pt's wife noted difficulty administering tube feeding formula via PEG. Pt denies that he has ever missed a feeding 2/2 clogged tube, however states tube has never been "looked at" since it was placed ~ 3 years ago.  NUTRITION DIAGNOSIS: -Inadequate oral intake (NI-2.1).  Status: Ongoing  RELATED TO: inability to eat  AS EVIDENCE BY: NPO status.  MONITORING/EVALUATION(Goals): Goal: TF to meet 90-100% of estimated needs. Met. Monitor: TF tolerance, weights, labs, I/Os  EDUCATION NEEDS: -No education needs identified at this time  INTERVENTION: 1. Consider GI consult as pt states tube has become difficult to use (sometimes clogs) and to his knowledge has never been "looked at" since placed ~3 years ago. 2. Recommend regimen of 300 cc of Jevity 1.2 via tube QID to better meet nutrition needs. This will provide: 1440 kcal, 67 grams protein, 968 ml free water. 3. RD to continue to follow nutrition care plan  Dietitian #: [phone]  DOCUMENTATION CODES Per approved criteria  -Not Applicable    Adair Laundry 12/13/2011, 10:31 AM

## 2011-12-14 LAB — BASIC METABOLIC PANEL
CO2: 29 mEq/L (ref 19–32)
Calcium: 8.4 mg/dL (ref 8.4–10.5)
Glucose, Bld: 124 mg/dL — ABNORMAL HIGH (ref 70–99)
Sodium: 131 mEq/L — ABNORMAL LOW (ref 135–145)

## 2011-12-14 LAB — CARDIAC PANEL(CRET KIN+CKTOT+MB+TROPI)
CK, MB: 5.4 ng/mL — ABNORMAL HIGH (ref 0.3–4.0)
CK, MB: 5.4 ng/mL — ABNORMAL HIGH (ref 0.3–4.0)
Relative Index: INVALID (ref 0.0–2.5)
Total CK: 68 U/L (ref 7–232)
Troponin I: <0.3 ng/mL (ref <0.30)
Troponin I: <0.3 ng/mL (ref <0.30)

## 2011-12-14 MED ORDER — CARVEDILOL 3.125 MG PO TABS
3.1250 mg | ORAL_TABLET | Freq: Two times a day (BID) | ORAL | Status: DC
  Administered 2011-12-14 – 2011-12-15 (×4): 3.125 mg
  Filled 2011-12-14 (×6): qty 1

## 2011-12-14 NOTE — Progress Notes (Signed)
Subjective:   patient denies any chest pain breathing gradually improving complains of insomnia. Denies any palpitations had few beats of nonsustained V. tach on the monitor asymptomatic  Objective:  Vital Signs in the last 24 hours: Temp:  [97.6 F (36.4 C)-98 F (36.7 C)] 97.6 F (36.4 C) (04/09 1610) Pulse Rate:  [69-76] 76  (04/09 0614) Resp:  [20] 20  (04/09 0614) BP: (171-189)/(80-94) 189/94 mmHg (04/09 0614) SpO2:  [95 %-100 %] 100 % (04/09 0614) FiO2 (%):  [2 %] 2 % (04/08 1720)  Intake/Output from previous day: 04/08 0701 - 04/09 0700 In: 1320 [IV Piggyback:300] Out: 825 [Urine:825] Intake/Output from this shift:    Physical Exam: Neck: no adenopathy, no carotid bruit, no JVD and supple, symmetrical, trachea midline Lungs: Decreased breath sounds at bases with right basilar rhonchi Heart: regular rate and rhythm, S1, S2 normal and Soft systolic murmur noted no S3 gallop Abdomen: soft, non-tender; bowel sounds normal; no masses,  no organomegaly Extremities: extremities normal, atraumatic, no cyanosis or edema  Lab Results:  Basename 12/13/11 0630 12/12/11 1953  WBC 3.7* 3.7*  HGB 14.4 13.7  PLT 156 150    Basename 12/14/11 0113 12/13/11 0630  NA 131* 128*  K 4.9 4.9  CL 96 94*  CO2 29 26  GLUCOSE 124* 114*  BUN 20 18  CREATININE 0.89 0.82    Basename 12/12/11 1422  TROPONINI <0.30   Hepatic Function Panel  Basename 12/12/11 1953  PROT 6.5  ALBUMIN 2.7*  AST 27  ALT 33  ALKPHOS 57  BILITOT 0.4  BILIDIR --  IBILI --   No results found for this basename: CHOL in the last 72 hours No results found for this basename: PROTIME in the last 72 hours  Imaging: Imaging results have been reviewed and Dg Chest 2 View  12/12/2011  *RADIOLOGY REPORT*  Clinical Data: Pain  CHEST - 2 VIEW  Comparison: 11/24/2011  Findings: Cardiomediastinal silhouette is stable. Bilateral emphysematous changes again noted.  Stable bullous changes especially in upper lobes.   No pulmonary edema.  There is right basilar atelectasis or early infiltrate.  Bony thorax is stable. Nodular density in the left midlung may represent nipple shadow.  Repeat frontal view with nipple markers recommended.  IMPRESSION: Bilateral emphysematous / bullous changes again noted. Right base atelectasis or early infiltrate. Nodular density in the left midlung may represent nipple shadow.  Repeat frontal view with nipple markers recommended.  Original Report Authenticated By: Natasha Mead, M.D.    Cardiac Studies:  Assessment/Plan:  Resolving exacerbation of COPD  Right lower lobe pneumonia  CAD history of inferior wall myocardial infarction in the past  Hypertension  Glucose intolerance Status post nonsustained V. tach asymptomatic COPD  GERD  History of esophageal stricture  Dysphagia  Status post PEG  Hypercholesteremia  History of diverticulosis  Protein calorie malnutrition  Tobacco abuse  Hyponatremia  History of mild renal insufficiency stable Plan Continue present management Increase ambulation Incentive spirometry  LOS: 2 days    Kalei Meda N 12/14/2011, 9:02 AM

## 2011-12-14 NOTE — Progress Notes (Signed)
Called by RN to inform me that patient had 8 beats of Ventricular tachycardia followed by an other 5 beats. He is somewhat more short of breath than his baseline.  Will check BMET,cycle cardiac enzymes, obtain EKG and increase his coreg to twice a day dosing. patient has a history of CAD and COPD  Chart including problem list, medications, labs and vitals were reviewed  [name] 1:14 AM

## 2011-12-14 NOTE — Progress Notes (Signed)
Pt. Had an 8 beat run of vtach. On cal MD notified and new orders placed in EPIC.  Junior Kenedy E

## 2011-12-14 NOTE — Progress Notes (Signed)
Physical Therapy Treatment Patient Details Name: [name] MRN: [id number] DOB: Apr 30, 1919 Today's Date: 12/14/2011  PT Assessment/Plan  PT - Assessment/Plan Comments on Treatment Session: Mr. Blatz did very well today. Ed pt on the benefits of daily mobilty and ambulation with nursing and pt is agreeable to ambulate 2-3 short walks daily with nursing staff. Ed pt on the benefits of getting up to sit on the 3in1 insteady of using the bed pan. Explained the benefits for respiration of being upright and couphing up mucous.  PT Plan: Discharge plan remains appropriate;Frequency remains appropriate PT Frequency: Min 3X/week Follow Up Recommendations: Home health PT and Supervision for mobility/OOB Equipment Recommended: Rolling walker with 5" wheels PT Goals  Acute Rehab PT Goals PT Goal: Supine/Side to Sit - Progress: Progressing toward goal PT Goal: Sit to Supine/Side - Progress: Progressing toward goal PT Goal: Sit to Stand - Progress: Progressing toward goal PT Goal: Stand to Sit - Progress: Progressing toward goal PT Transfer Goal: Bed to Chair/Chair to Bed - Progress: Progressing toward goal PT Goal: Stand - Progress: Progressing toward goal PT Goal: Ambulate - Progress: Progressing toward goal PT Goal: Perform Home Exercise Program - Progress: Progressing toward goal  PT Treatment Precautions/Restrictions  Precautions Precautions: Fall Restrictions Weight Bearing Restrictions: No Mobility (including Balance) Bed Mobility Bed Mobility: Yes Supine to Sit: 5: Supervision;HOB elevated (Comment degrees);With rails (30 degrees) Supine to Sit Details (indicate cue type and reason): cues to sequence rolling and sidelying ->sit to ease transfer (pt did very well with this)  Sitting - Scoot to Edge of Bed: 6: Modified independent (Device/Increase time) Transfers Sit to Stand: From elevated surface;From bed;With upper extremity assist (mingaurdA) Sit to Stand Details (indicate cue  type and reason): cues for safe hand placement especially with RW in front of patient Stand to Sit: With armrests;To chair/3-in-1;With upper extremity assist (mingaurdA) Ambulation/Gait Ambulation/Gait Assistance: 4: Min assist (mingaurdA) Ambulation/Gait Assistance Details (indicate cue type and reason): amb approx 110 ft with RW and  min-mingaurdA; slow shuffled gait but pt able to negotiate RW well; flexed trunk, cues for safety with turns and cues for upright posture Ambulation Distance (Feet): 110 Feet Assistive device: Rolling walker    Exercise  General Exercises - Lower Extremity Ankle Circles/Pumps: AROM;Both;20 reps;Supine Long Arc Quad: AROM;Both;20 reps;Seated Hip Flexion/Marching: AROM;Both;Other reps (comment);Seated (10 reps x2) Toe Raises: AROM;Both;5 reps;Seated Heel Raises: AROM;Both;5 reps;Seated End of Session PT - End of Session Equipment Utilized During Treatment: Gait belt Activity Tolerance: Patient tolerated treatment well Patient left: in chair;with call bell in reach (chair alarm on) Nurse Communication: Mobility status for transfers;Mobility status for ambulation General Behavior During Session: [facility] for tasks performed Cognition: [facility] for tasks performed  [facility] [name] 12/14/2011, 3:55 PM

## 2011-12-15 LAB — CARDIAC PANEL(CRET KIN+CKTOT+MB+TROPI)
CK, MB: 4.9 ng/mL — ABNORMAL HIGH (ref 0.3–4.0)
CK, MB: 5.1 ng/mL — ABNORMAL HIGH (ref 0.3–4.0)
CK, MB: 4.9 ng/mL — ABNORMAL HIGH (ref 0.3–4.0)
CK, MB: 4.9 ng/mL — ABNORMAL HIGH (ref 0.3–4.0)
Total CK: 50 U/L (ref 7–232)
Troponin I: <0.3 ng/mL (ref <0.30)
Troponin I: <0.3 ng/mL (ref <0.30)

## 2011-12-15 MED ORDER — CARVEDILOL 6.25 MG PO TABS
6.2500 mg | ORAL_TABLET | Freq: Two times a day (BID) | ORAL | Status: DC
  Administered 2011-12-15 – 2011-12-16 (×2): 6.25 mg
  Filled 2011-12-15 (×4): qty 1

## 2011-12-15 MED ORDER — AMLODIPINE BESYLATE 5 MG PO TABS
5.0000 mg | ORAL_TABLET | Freq: Every day | ORAL | Status: DC
  Administered 2011-12-15: 5 mg
  Filled 2011-12-15 (×2): qty 1

## 2011-12-15 NOTE — Progress Notes (Signed)
Patient blood pressure is down after intervention. SBP 167 now. Patient asymptomatic. [name]

## 2011-12-15 NOTE — Progress Notes (Signed)
Subjective:  Patient denies any chest pain states breathing has improved  Objective:  Vital Signs in the last 24 hours: Temp:  [97.1 F (36.2 C)-97.6 F (36.4 C)] 97.3 F (36.3 C) (04/10 0700) Pulse Rate:  [70-74] 73  (04/10 0700) Resp:  [18-20] 20  (04/10 0700) BP: (145-190)/(77-94) 190/77 mmHg (04/10 0700) SpO2:  [100 %] 100 % (04/10 0829)  Intake/Output from previous day: 04/09 0701 - 04/10 0700 In: 1230 [IV Piggyback:550] Out: 902 [Urine:900; Stool:2] Intake/Output from this shift:    Physical Exam: Neck: no adenopathy, no carotid bruit, no JVD and supple, symmetrical, trachea midline Lungs: Decreased breath sounds at bases with right basilar rhonchi Heart: regular rate and rhythm, S1, S2 normal and Soft systolic murmur noted no S3 gallop Abdomen: soft, non-tender; bowel sounds normal; no masses,  no organomegaly Extremities: extremities normal, atraumatic, no cyanosis or edema  Lab Results:  Basename 12/13/11 0630 12/12/11 1953  WBC 3.7* 3.7*  HGB 14.4 13.7  PLT 156 150    Basename 12/14/11 0113 12/13/11 0630  NA 131* 128*  K 4.9 4.9  CL 96 94*  CO2 29 26  GLUCOSE 124* 114*  BUN 20 18  CREATININE 0.89 0.82    Basename 12/15/11 0900 12/15/11 0059  TROPONINI <0.30 <0.30   Hepatic Function Panel  Basename 12/12/11 1953  PROT 6.5  ALBUMIN 2.7*  AST 27  ALT 33  ALKPHOS 57  BILITOT 0.4  BILIDIR --  IBILI --   No results found for this basename: CHOL in the last 72 hours No results found for this basename: PROTIME in the last 72 hours  Imaging: Imaging results have been reviewed and No results found.  Cardiac Studies:  Assessment/Plan:  Resolving exacerbation of COPD  Right lower lobe pneumonia  CAD history of inferior wall myocardial infarction in the past  Hypertension  Glucose intolerance  Status post nonsustained V. tach asymptomatic  COPD  GERD  History of esophageal stricture  Dysphagia  Status post PEG  Hypercholesteremia  History  of diverticulosis  Protein calorie malnutrition  Tobacco abuse  Hyponatremia  History of mild renal insufficiency stable Plan Continue present management check labs and chest x-ray in a.m.  LOS: 3 days    Lola Czerwonka N 12/15/2011, 12:16 PM

## 2011-12-15 NOTE — Progress Notes (Signed)
Patient bp elevated 211/ 82. Also had an 8 beat run of vtac. Dr Sharyn Lull notified. New orders received. Madelin Rear Ray

## 2011-12-16 ENCOUNTER — Inpatient Hospital Stay (HOSPITAL_COMMUNITY): Payer: Medicare Other

## 2011-12-16 LAB — BASIC METABOLIC PANEL
BUN: 13 mg/dL (ref 6–23)
CO2: 27 mEq/L (ref 19–32)
Chloride: 101 mEq/L (ref 96–112)
Creatinine, Ser: 0.8 mg/dL (ref 0.50–1.35)
Glucose, Bld: 113 mg/dL — ABNORMAL HIGH (ref 70–99)

## 2011-12-16 LAB — CBC
HCT: 42.2 % (ref 39.0–52.0)
MCV: 90.4 fL (ref 78.0–100.0)
RBC: 4.67 MIL/uL (ref 4.22–5.81)
WBC: 5 10*3/uL (ref 4.0–10.5)

## 2011-12-16 LAB — CARDIAC PANEL(CRET KIN+CKTOT+MB+TROPI): Total CK: 64 U/L (ref 7–232)

## 2011-12-16 MED ORDER — CARVEDILOL 12.5 MG PO TABS
12.5000 mg | ORAL_TABLET | Freq: Two times a day (BID) | ORAL | Status: DC
  Administered 2011-12-16 – 2011-12-17 (×2): 12.5 mg
  Filled 2011-12-16 (×4): qty 1

## 2011-12-16 MED ORDER — AMLODIPINE BESYLATE 5 MG PO TABS
5.0000 mg | ORAL_TABLET | Freq: Two times a day (BID) | ORAL | Status: DC
  Administered 2011-12-16 – 2011-12-17 (×2): 5 mg
  Filled 2011-12-16 (×4): qty 1

## 2011-12-16 NOTE — Progress Notes (Signed)
Physical Therapy Treatment Patient Details Name: [name] MRN: [id number] DOB: 27-Aug-1919 Today's Date: 12/16/2011  PT Assessment/Plan  PT - Assessment/Plan Comments on Treatment Session: No major progress today because pt not willing to pariticpate in anhthign other than getting into the chair.  PT Plan: Discharge plan remains appropriate;Frequency remains appropriate;Equipment recommendations need to be updated Follow Up Recommendations: Home health PT;Supervision/Assistance - 24 hour Equipment Recommended: Defer to next venue PT Goals  Acute Rehab PT Goals PT Goal: Supine/Side to Sit - Progress: Met PT Goal: Sit to Stand - Progress: Progressing toward goal PT Goal: Stand to Sit - Progress: Progressing toward goal PT Transfer Goal: Bed to Chair/Chair to Bed - Progress: Progressing toward goal PT Goal: Stand - Progress: Progressing toward goal PT Goal: Ambulate - Progress: Not progressing  PT Treatment Precautions/Restrictions  Precautions Precautions: Fall Restrictions Weight Bearing Restrictions: No Mobility (including Balance) Bed Mobility Supine to Sit: 5: Supervision;HOB elevated (Comment degrees);With rails (40 degrees) Sitting - Scoot to Edge of Bed: 6: Modified independent (Device/Increase time) Transfers Sit to Stand: 5: Supervision;From bed;With upper extremity assist Sit to Stand Details (indicate cue type and reason): cues for safe technique with hand placement Stand to Sit: To chair/3-in-1;5: Supervision;With armrests;With upper extremity assist Stand to Sit Details: cues for safe hand placement and positioning Ambulation/Gait Ambulation/Gait Assistance: 5: Supervision Ambulation/Gait Assistance Details (indicate cue type and reason): amb approx 10 ft to sit in the chair (not agreeable to any other therapy or exercises today); slow and shuffled gait again, cues for safe negotiation of RW Ambulation Distance (Feet): 10 Feet Assistive device: Rolling walker    Exercise    End of Session PT - End of Session Equipment Utilized During Treatment: Gait belt Activity Tolerance: Treatment limited secondary to medical complications (Comment) (limited by self, not feeling well) Patient left: in chair;with call bell in reach (chair alarm) Nurse Communication: Mobility status for transfers;Mobility status for ambulation General Behavior During Session: [facility] for tasks performed Cognition: [facility] for tasks performed  [facility] HELEN 12/16/2011, 5:12 PM

## 2011-12-16 NOTE — Progress Notes (Signed)
40 Contacted Dr. Adela Glimpse regarding continuation of cardiac enyzmes labs being drawn every 6 hrs.  Dr. Adela Glimpse gave verbal order to discontinue cardiac enzymes every 6 hrs. since troponin was negative.  Dahlia Byes Boschen

## 2011-12-16 NOTE — Progress Notes (Signed)
Subjective:  Patient denies any chest pain and states breathing is improved complains of hiccoups. Her BP remains elevated  Objective:  Vital Signs in the last 24 hours: Temp:  [98 F (36.7 C)-98.2 F (36.8 C)] 98.2 F (36.8 C) (04/11 0437) Pulse Rate:  [66-72] 72  (04/11 0846) Resp:  [20] 20  (04/11 0437) BP: (148-211)/(79-110) 190/110 mmHg (04/11 0913) SpO2:  [96 %-100 %] 99 % (04/11 0825)  Intake/Output from previous day: 04/10 0701 - 04/11 0700 In: 300 [IV Piggyback:300] Out: 900 [Urine:900] Intake/Output from this shift:    Physical Exam: Neck: no adenopathy, no carotid bruit, no JVD and supple, symmetrical, trachea midline Lungs: Decreased breath sounds at bases with occasional rhonchi Heart: regular rate and rhythm, S1, S2 normal and Soft systolic murmur noted Abdomen: soft, non-tender; bowel sounds normal; no masses,  no organomegaly Extremities: extremities normal, atraumatic, no cyanosis or edema  Lab Results: No results found for this basename: WBC:2,HGB:2,PLT:2 in the last 72 hours  Basename 12/14/11 0113  NA 131*  K 4.9  CL 96  CO2 29  GLUCOSE 124*  BUN 20  CREATININE 0.89    Basename 12/16/11 0033 12/15/11 1855  TROPONINI <0.30 <0.30   Hepatic Function Panel No results found for this basename: PROT,ALBUMIN,AST,ALT,ALKPHOS,BILITOT,BILIDIR,IBILI in the last 72 hours No results found for this basename: CHOL in the last 72 hours No results found for this basename: PROTIME in the last 72 hours  Imaging: Imaging results have been reviewed and Dg Chest 2 View  12/16/2011  *RADIOLOGY REPORT*  Clinical Data: Follow up right lower lobe pneumonia  CHEST - 2 VIEW  Comparison: 12/12/2011 and 11/24/2011  Findings: Cardiomediastinal silhouette is stable.  Stable hyperinflation and chronic interstitial prominence.  There is improvement in aeration right base with minimal streaky residual scarring or atelectasis.  No new infiltrate is noted.  No pulmonary edema.   Stable osteopenia thoracic spine.  IMPRESSION: Stable hyperinflation and chronic interstitial prominence. Improvement in aeration with right base with minimal streaky residual scarring or atelectasis.  No new infiltrate noted.  No pulmonary edema.  Original Report Authenticated By: Natasha Mead, M.D.    Cardiac Studies:  Assessment/Plan:  Resolving exacerbation of COPD  Resolving Right lower lobe pneumonia  CAD history of inferior wall myocardial infarction in the past  Hypertension uncontrolled Glucose intolerance  Status post nonsustained V. tach asymptomatic  COPD  GERD  History of esophageal stricture  Dysphagia  Status post PEG  Hypercholesteremia  History of diverticulosis  Protein calorie malnutrition  Tobacco abuse  Hyponatremia  History of mild renal insufficiency stable Plan As per orders   LOS: 4 days    Secilia Apps N 12/16/2011, 9:37 AM

## 2011-12-17 MED ORDER — AMLODIPINE BESYLATE 5 MG PO TABS: 5.0000 mg | ORAL_TABLET | Freq: Every day | ORAL | Status: AC

## 2011-12-17 MED ORDER — AZITHROMYCIN 200 MG/5ML PO SUSR
500.0000 mg | Freq: Every day | ORAL | Status: DC
  Filled 2011-12-17: qty 12.5

## 2011-12-17 MED ORDER — AZITHROMYCIN 200 MG/5ML PO SUSR: 500.0000 mg | Freq: Every day | ORAL | Status: AC

## 2011-12-17 NOTE — Discharge Summary (Signed)
  Discharge summary dictated on 12/17/2011 dictation number is [phone]

## 2011-12-17 NOTE — Progress Notes (Signed)
PHARMACIST - PHYSICIAN COMMUNICATION DR: Sharyn Lull CONCERNING: Antibiotic IV to Oral Route Change Policy  RECOMMENDATION: This patient is receiving azithromycin by the intravenous route.  Based on criteria approved by the Pharmacy and Therapeutics Committee, the antibiotic(s) is/are being converted to the equivalent oral dose form(s).   DESCRIPTION: These criteria include:  Patient being treated for a respiratory tract infection, urinary tract infection, or cellulitis  The patient is not neutropenic and does not exhibit a GI malabsorption state  The patient is eating (either orally or via tube) and/or has been taking other orally administered medications for a least 24 hours  The patient is improving clinically and has a Tmax < 100.5  If you have questions about this conversion, please contact the Pharmacy Department  []   [phone] )  [facility] [x]   [phone] )  [facility]  []   [phone] )  [facility] []   [phone] )  [facility]    Herby Abraham, [place].D. [phone] 12/17/2011 9:57 AM

## 2011-12-17 NOTE — Discharge Summary (Signed)
[name], [name]               ACCOUNT NO.:  [id number]  MEDICAL RECORD NO.:  [id number]  LOCATION:  5530                         FACILITY:  [facility]  PHYSICIAN:  Eduardo Osier. Sharyn Lull, M.D. DATE OF BIRTH:  [birth year]  DATE OF ADMISSION:  12/12/2011 DATE OF DISCHARGE:  12/17/2011                              DISCHARGE SUMMARY   ADMITTING DIAGNOSES: 1. Exacerbation of chronic obstructive pulmonary disease. 2. Right lower lobe pneumonia. 3. Coronary artery disease. 4. History of inferior wall myocardial infarction in the past, status     post percutaneous coronary intervention to right coronary artery in     the past. 5. Hypertension. 6. Chronic obstructive pulmonary disease. 7. Gastroesophageal reflux disease. 8. History of esophageal stricture. 9. Dysphagia, status post PEG tube placement in the past. 10.Chronic hiccups. 11.Hypercholesteremia. 12.History of diverticulosis. 13.Protein-calorie malnutrition. 14.History of tobacco abuse. 15.History of alcohol abuse in the past. 16.History of mild renal insufficiency, which is stable.  DISCHARGE DIAGNOSES: 1. Status post exacerbation of chronic obstructive pulmonary disease. 2. Status post right lower lobe pneumonia. 3. Coronary artery disease. 4. History of inferior wall myocardial infarction in the past, status     post percutaneous coronary intervention to right coronary artery,     stable. 5. Hypertension. 6. Chronic obstructive pulmonary disease. 7. Gastroesophageal reflux disease. 8. History of esophageal stricture. 9. History of PEG placement in the past. 10.History of dysphagia. 11.Chronic cough. 12.Chronic hiccups. 13.Hypercholesteremia. 14.History of diverticulosis. 15.Protein-calorie malnutrition. 16.History of tobacco abuse. 17.History of alcohol abuse in the past. 18.History of mild renal insufficiency, which is stable.  DISCHARGE HOME MEDICATIONS: 1. Amlodipine 5 mg 1 tab daily. 2. Zithromax in 200 mg/5 mL,  12.5 mL daily for 4 more days.  Continue home other medications, i.e., 1. Tylenol No. 4 1 tablet every 4 hours as needed for pain. 2. Albuterol 2.5 mg by nebulizer every 6 hours. 3. Enteric-coated aspirin 81 mg 1 tablet daily. 4. Baclofen 10 mg 3 times daily. 5. Pulmicort by nebulizer twice daily as before. 6. Carvedilol 3.25 mg 1 tablet daily. 7. Plavix 75 mg 1 tablet daily. 8. Omeprazole 40 mg 1 daily. 9. Atrovent 500 mcg by nebulizer 4 times daily. 10.Jevity Plus liquid 5 times daily as before. 11.Losartan 50 mg twice daily as before. 12.Rapaflo 8 mg daily with breakfast. 13.Slow iron 1 tablet via PEG twice daily as before.  ACTIVITY:  Increase activity slowly as tolerated.  The patient has been encouraged to do incentive spirometry at home.  Follow up with Dr. Marchelle Gearing in 1-2 weeks.  Follow up with me in 1 week.  CONDITION AT DISCHARGE:  Stable.  BRIEF HISTORY AND HOSPITAL COURSE:  [name] is a 78 year old black male with past medical history significant for coronary artery disease; history of inferior wall myocardial infarction in the past, status post PTCA and stenting to RCA; hypertension; hypercholesteremia, GERD; history of esophageal strictures; COPD; dysphagia, status post PEG tube placement; history of diverticulosis; history of mild renal insufficiency; anemia; malnutrition; chronic hiccups; history of tobacco abuse, alcohol abuse in the remote past.  He came to the ER complaining of progressive increasing shortness of breath with minimal exertion by walking in the room associated with  coughing up yellow mucus.  The patient denies any fever or chills.  The patient was seen by Dr. Marchelle Gearing, [facility] and was treated by questionable antibiotics and steroid without much improvement.  As per his wife, lately he has been requiring more oxygen to keep his oxygenation.  The patient denies any chest pain, nausea, vomiting, diaphoresis.  Denies  palpitation, lightheadedness, or syncope.  Denies PND, orthopnea, or leg swelling. The patient in the ER was noted to have expiratory wheezing with occasional right basilar rhonchi.  Chest x-ray showed atelectasis versus infiltrate at the right base.  PAST MEDICAL HISTORY:  As above.  PAST SURGICAL HISTORY:  He had right eye macular degeneration, right eye surgery, he had stents in the past to RCA.  MEDICATIONS AT HOME:  Same as discharge medication except Zithromax and amlodipine.  PHYSICAL EXAMINATION:  VITAL SIGNS:  His blood pressure was 142/94, pulse was 86, he was afebrile. HEENT:  Conjunctivae was pink. NECK:  Supple.  No JVD.  No bruit. LUNGS:  He has decreased breath sounds at bases with occasional right basilar rhonchi and faint expiratory wheezing. CARDIOVASCULAR:  S1, S2 was normal.  There was soft systolic murmur. There was no S3 gallop. ABDOMEN:  Soft.  Bowel sounds were present.  There was no distention, no tenderness.  PEG site was okay with no evidence of erythema, oozing or any drainage. EXTREMITIES:  There is no clubbing, cyanosis, or edema.  LABORATORY STUDIES:  Sodium was 131, potassium 4.9, BUN 20, creatinine 0.89.  Hemoglobin was 14.4, hematocrit 42.5, white count of 3.7.  Repeat electrolytes yesterday; sodium 136, potassium 4.4, BUN 13, creatinine 0.80.  Hemoglobin 14.2, hematocrit 42.2, white count of 5.0.  Chest x-ray showed bilateral emphysematous bullous changes, right base atelectasis versus early infiltrate.  Repeat x-ray done yesterday showed stable hyperinflation and chronic interstitial prominence, improvement in aeration with right base with minimal PE, residual scarring or atelectasis.  No new infiltrates were noted.  No pulmonary edema was appreciated.  BRIEF HOSPITAL COURSE:  The patient was admitted to a telemetry unit, was started on IV Zithromax and Rocephin, and continued on his home medications with gradual improvement in his  breathing and wheezing. OT/PT consultation was obtained.  The patient is ambulating with assistance in the room.  Discussed with the patient's family regarding skilled nursing facility.  His wife stated he is able to do his activities of daily living with minimal assistance at home and would wish him to be discharge home.  The patient will be discharged home on above medications and will be followed up in my office in 1 week and with Dr. Marchelle Gearing, [facility] in 1-2 weeks.     Eduardo Osier. Sharyn Lull, M.D.     [facility]  D:  12/17/2011  T:  12/17/2011  Job:  [id number]

## 2011-12-17 NOTE — Discharge Instructions (Signed)
Chronic Obstructive Pulmonary Disease Chronic obstructive pulmonary disease (COPD) is a lung disease. The lungs become damaged, making it hard to get air in and out of your lungs. The damage to your lungs cannot be changed.  HOME CARE  Stop smoking if you smoke. Avoid secondhand smoke.   Only take medicine as told by your doctor.   Talk to your doctor about using cough syrup or over-the-counter medicines.   Drink enough fluids to keep your pee (urine) clear or pale yellow.   Use a humidifier or vaporizer. This may help loosen the thick spit (mucus).   Talk to your doctor about vaccines that help prevent other lung problems (pneumonia and flu vaccines).   Use home oxygen as told by your doctor.   Stay active and exercise.   Eat healthy foods.  GET HELP RIGHT AWAY IF:   Your heart is beating fast.   You become disturbed, confused, shake, or are dazed.   You have trouble breathing.   You have chest pain.   You have a fever.   You cough up thick spit that is yellowish-white or green.   Your breathing becomes worse when you exercise.   You are running out of the medicine you take for your breathing.  MAKE SURE YOU:   Understand these instructions.   Will watch your condition.   Will get help right away if you are not doing well or get worse.  Document Released: 02/09/2008 Document Revised: 08/12/2011 Document Reviewed: 10/23/2010 [facility] Patient Information 2012 [place], [place].Chronic Obstructive Pulmonary Disease Chronic obstructive pulmonary disease (COPD) is a condition in which airflow from the lungs is restricted. The lungs can never return to normal, but there are measures you can take which will improve them and make you feel better. CAUSES   Smoking.   Exposure to secondhand smoke.   Breathing in irritants (pollution, cigarette smoke, strong smells, aerosol sprays, paint fumes).   History of lung infections.  TREATMENT  Treatment focuses on making you  comfortable (supportive care). Your caregiver may prescribe medications (inhaled or pills) to help improve your breathing. HOME CARE INSTRUCTIONS   If you smoke, stop smoking.   Avoid exposure to smoke, chemicals, and fumes that aggravate your breathing.   Take antibiotic medicines as directed by your caregiver.   Avoid medicines that dry up your system and slow down the elimination of secretions (antihistamines and cough syrups). This decreases respiratory capacity and may lead to infections.   Drink enough water and fluids to keep your urine clear or pale yellow. This loosens secretions.   Use humidifiers at home and at your bedside if they do not make breathing difficult.   Receive all protective vaccines your caregiver suggests, especially pneumococcal and influenza.   Use home oxygen as suggested.   Stay active. Exercise and physical activity will help maintain your ability to do things you want to do.   Eat a healthy diet.  SEEK MEDICAL CARE IF:   You develop pus-like mucus (sputum).   Breathing is more labored or exercise becomes difficult to do.   You are running out of the medicine you take for your breathing.  SEEK IMMEDIATE MEDICAL CARE IF:   You have a rapid heart rate.   You have agitation, confusion, tremors, or are in a stupor (family members may need to observe this).   It becomes difficult to breathe.   You develop chest pain.   You have a fever.  MAKE SURE YOU:   Understand  these instructions.   Will watch your condition.   Will get help right away if you are not doing well or get worse.  Document Released: 06/02/2005 Document Revised: 08/12/2011 Document Reviewed: 10/23/2010 [facility] Patient Information 2012 [place], [place].Chronic Obstructive Pulmonary Disease Chronic obstructive pulmonary disease (COPD) is a condition in which airflow from the lungs is restricted. The lungs can never return to normal, but there are measures you can take which will  improve them and make you feel better. CAUSES   Smoking.   Exposure to secondhand smoke.   Breathing in irritants (pollution, cigarette smoke, strong smells, aerosol sprays, paint fumes).   History of lung infections.  TREATMENT  Treatment focuses on making you comfortable (supportive care). Your caregiver may prescribe medications (inhaled or pills) to help improve your breathing. HOME CARE INSTRUCTIONS   If you smoke, stop smoking.   Avoid exposure to smoke, chemicals, and fumes that aggravate your breathing.   Take antibiotic medicines as directed by your caregiver.   Avoid medicines that dry up your system and slow down the elimination of secretions (antihistamines and cough syrups). This decreases respiratory capacity and may lead to infections.   Drink enough water and fluids to keep your urine clear or pale yellow. This loosens secretions.   Use humidifiers at home and at your bedside if they do not make breathing difficult.   Receive all protective vaccines your caregiver suggests, especially pneumococcal and influenza.   Use home oxygen as suggested.   Stay active. Exercise and physical activity will help maintain your ability to do things you want to do.   Eat a healthy diet.  SEEK MEDICAL CARE IF:   You develop pus-like mucus (sputum).   Breathing is more labored or exercise becomes difficult to do.   You are running out of the medicine you take for your breathing.  SEEK IMMEDIATE MEDICAL CARE IF:   You have a rapid heart rate.   You have agitation, confusion, tremors, or are in a stupor (family members may need to observe this).   It becomes difficult to breathe.   You develop chest pain.   You have a fever.  MAKE SURE YOU:   Understand these instructions.   Will watch your condition.   Will get help right away if you are not doing well or get worse.  Document Released: 06/02/2005 Document Revised: 08/12/2011 Document Reviewed:  10/23/2010 [facility] Patient Information 2012 [place], [place].

## 2011-12-17 NOTE — Progress Notes (Signed)
Patient discharge teaching given, including activity, diet, follow-up appoints, and medications. Patient verbalized understanding of all discharge instructions. IV access was d/c'd. Vitals are stable. Skin is intact except as charted in most recent assessments. Pt to be escorted out by NT, to be driven home by family. 

## 2011-12-18 LAB — CULTURE, BLOOD (ROUTINE X 2)
Culture  Setup Time: 201304072008
Culture  Setup Time: 201304072008
Culture: NO GROWTH

## 2011-12-29 ENCOUNTER — Ambulatory Visit (INDEPENDENT_AMBULATORY_CARE_PROVIDER_SITE_OTHER): Payer: Medicare Other | Admitting: Internal Medicine

## 2011-12-29 ENCOUNTER — Encounter: Payer: Self-pay | Admitting: Internal Medicine

## 2011-12-29 DIAGNOSIS — J449 Chronic obstructive pulmonary disease, unspecified: Secondary | ICD-10-CM

## 2011-12-29 NOTE — Progress Notes (Signed)
Subjective:    Patient ID: [name], male    DOB: 02/17/1919, 78 y.o.   MRN: [id number]  HPI Problem List 1.  COPD   - Gold stage 2- s/p rehab compleetion Aug 2011. -No desaturation walking 185 feet x 3 laps in Oct 2011. Rx spiriva - New exertional hypoxema - May 2012; commenced on O2  - Non compliance with RX 2012 onwards  -Worsened Gold stage 4 copd 11/24/11:  Spirometry fev1 0.88L/36% and worst ever. Walk test: 185 feet x 3 laps: desaturated at first lap to 88%. Non compliant with rx) 2. LUL Nodule - pseudotumor , 2-3cm, PET negative March 2011.  3. h of LLL necrotixing pna and dysphagia/peg tube - Summer 2010, Cleared March 201 4. Chronic PEG feed due to ? Achalasia - fu with Dr Loreta Ave 5. Body mass index is 21.27 kg/(m^2).   - Body mass index is 21.03 kg/(m^2). - 12/29/2011  6. AECOPD hx  - July 2012 - opd RX  - March 2013 - opd RX - April 7 - 12, Admit for AECOPD and RLL PNA per dc summary though cxr had no pna     OV 12/29/2011 followup for above. Presents with wife as before. Since last visit, admitted by Dr Sharyn Lull earlier this month for AECOPD and RLL PNA (to my review cxr did not show infiltrates). Has improved from admission but still has dyspnea on exertion (room to room, class 3). Again not clear if he is compliant with medications. Wife says he is obstinate in following instruction but he thinks he is taking his nebs. Not using o2 regularly either. Last visit, agreed to do rehab but yet to join. He just feels he is not back to normal. I do note tht with his very severe copd he is on non specific beta blocker coreg. I am unsure how long he has been on it. Not sure what his ef is either  Past, Family, Social reviewed: no change since last visit except as noted in HPI     Review of Systems  Constitutional: Negative for fever and unexpected weight change.  HENT: Negative for ear pain, nosebleeds, congestion, sore throat, rhinorrhea, sneezing, trouble swallowing, dental  problem, postnasal drip and sinus pressure.   Eyes: Negative for redness and itching.  Respiratory: Negative for cough, chest tightness, shortness of breath and wheezing.   Cardiovascular: Negative for palpitations and leg swelling.  Gastrointestinal: Negative for nausea and vomiting.  Genitourinary: Negative for dysuria.  Musculoskeletal: Negative for joint swelling.  Skin: Negative for rash.  Neurological: Negative for headaches.  Hematological: Does not bruise/bleed easily.  Psychiatric/Behavioral: Negative for dysphoric mood. The patient is not nervous/anxious.        Objective:   Physical Exam  GEN: A/Ox3; pleasant , NAD, elderly , frail   HEENT:  [place]/AT,  EACs-clear, TMs-wnl, NOSE-clear, THROAT-clear, no lesions, no postnasal drip or exudate noted.   NECK:  Supple w/ fair ROM; no JVD; normal carotid impulses w/o bruits; no thyromegaly or nodules palpated; no lymphadenopathy.  RESP  Coarse BS , diminished in bases, no accessory muscle use, no dullness to percussion  CARD:  RRR, no m/r/g  , no peripheral edema, pulses intact, no cyanosis or clubbing.  GI:   Soft & nt; nml bowel sounds; no organomegaly or masses detected.  Musco: Warm bil, no deformities or joint swelling noted.   Neuro: alert, no focal deficits noted.    Skin: Warm, no lesions or rashes  Assessment & Plan:

## 2011-12-29 NOTE — Patient Instructions (Signed)
Please continue your albuterol nebuluzer 4 times daily  And as needd + atrovent nebulzer 4 times daily + pulmicort nebulizer 2 times daily Jennifer will go through this regimen with you and ensure these are correct You need to use oxygen with sleep and with exertion REally important you comply with instructions because over the years  Yours lungs have gotten weaker At any time you need extra home help let us know Nurse will refer you to pulmonary rehab at [place] I will touch base with Dr Harwani about changing coreg to another medications; coreg might be making your shortness of breath worse REturn in 10 weeks or sooner if needed Call if any problems  

## 2011-12-30 ENCOUNTER — Telehealth: Payer: Self-pay | Admitting: Internal Medicine

## 2011-12-30 NOTE — Telephone Encounter (Signed)
Please get hold of Dr Sharyn Lull for me please Patient needs to come off coreg

## 2011-12-30 NOTE — Assessment & Plan Note (Signed)
Please continue your albuterol nebuluzer 4 times daily  And as needd + atrovent nebulzer 4 times daily + pulmicort nebulizer 2 times daily Victorino Dike will go through this regimen with you and ensure these are correct You need to use oxygen with sleep and with exertion REally important you comply with instructions because over the years  Yours lungs have gotten weaker At any time you need extra home help let us know Nurse will refer you to pulmonary rehab at [place] I will touch base with Dr Sharyn Lull about changing coreg to another medications; coreg might be making your shortness of breath worse REturn in 10 weeks or sooner if needed Call if any problems

## 2012-01-03 NOTE — Telephone Encounter (Signed)
Per Dr. Sharyn Lull pt was supposed to stop coreg. I called the pt wife and spoke to her and she states the pt has stopped this med. I advised that was correct to have pt stay off of the coreg. Carron Curie, CMA

## 2012-01-04 NOTE — Telephone Encounter (Signed)
I spoke to Dr Sharyn Lull and he confirmed he told patient to stop drug. My CMA called and informd this to wife

## 2012-03-10 ENCOUNTER — Ambulatory Visit: Payer: Medicare Other | Admitting: Internal Medicine

## 2012-04-09 ENCOUNTER — Other Ambulatory Visit: Payer: Self-pay | Admitting: Cardiology

## 2012-04-13 ENCOUNTER — Encounter: Payer: Self-pay | Admitting: Internal Medicine

## 2012-04-13 ENCOUNTER — Ambulatory Visit (INDEPENDENT_AMBULATORY_CARE_PROVIDER_SITE_OTHER): Payer: Medicare Other | Admitting: Internal Medicine

## 2012-04-13 ENCOUNTER — Ambulatory Visit: Payer: Medicare Other | Admitting: Internal Medicine

## 2012-04-13 VITALS — BP 118/78 | HR 90 | Temp 98.4°F | Ht 69.0 in | Wt 138.6 lb

## 2012-04-13 DIAGNOSIS — J449 Chronic obstructive pulmonary disease, unspecified: Secondary | ICD-10-CM

## 2012-04-13 NOTE — Progress Notes (Signed)
Subjective:    Patient ID: [name], male    DOB: Jan 16, 1919, 78 y.o.   MRN: [id number]  HPI  Problem List 1.  COPD   - Gold stage 2- s/p rehab compleetion Aug 2011. -No desaturation walking 185 feet x 3 laps in Oct 2011. Rx spiriva - New exertional hypoxema - May 2012; commenced on O2  - Non compliance with RX 2012 onwards  -Worsened Gold stage 4 copd 11/24/11:  Spirometry fev1 0.88L/36% and worst ever. Walk test: 185 feet x 3 laps: desaturated at first lap to 88%. Non compliant with rx) 2. LUL Nodule - pseudotumor , 2-3cm, PET negative March 2011.  3. h of LLL necrotixing pna and dysphagia/peg tube - Summer 2010, Cleared March 201 4. Chronic PEG feed due to ? Achalasia - fu with Dr Loreta Ave 5. Body mass index is 21.27 kg/(m^2).   - Body mass index is 21.03 kg/(m^2). - 12/29/2011  -Body mass index is 20.47 kg/(m^2). - 04/13/2012    6. AECOPD hx  - July 2012 - opd RX  - March 2013 - opd RX - April 7 - 12, Admit for AECOPD and RLL PNA per dc summary though cxr had no pna     OV 12/29/2011 followup for above. Presents with wife as before. Since last visit, admitted by Dr Sharyn Lull earlier this month for AECOPD and RLL PNA (to my review cxr did not show infiltrates). Has improved from admission but still has dyspnea on exertion (room to room, class 3). Again not clear if he is compliant with medications. Wife says he is obstinate in following instruction but he thinks he is taking his nebs. Not using o2 regularly either. Last visit, agreed to do rehab but yet to join. He just feels he is not back to normal. I do note tht with his very severe copd he is on non specific beta blocker coreg. I am unsure how long he has been on it. Not sure what his ef is either  Past, Family, Social reviewed: no change since last visit except as noted in HPI  REC Please continue your albuterol nebuluzer 4 times daily  And as needd + atrovent nebulzer 4 times daily + pulmicort nebulizer 2 times daily Victorino Dike  will go through this regimen with you and ensure these are correct You need to use oxygen with sleep and with exertion REally important you comply with instructions because over the years  Yours lungs have gotten weaker At any time you need extra home help let us know Nurse will refer you to pulmonary rehab at [place] I will touch base with Dr Sharyn Lull about changing coreg to another medications; coreg might be making your shortness of breath worse REturn in 10 weeks or sooner if needed Call if any problems   OV 04/13/2012 Followup for COPD. Presenst with wife. Dyspnea is class 3 and unchanged. They boith now maintain he is compliant with all meds. In addition, also off coreg. Still no improvement in dyspnea. Denies fever, cough, sputum, worsening edema. No interim problems.   Past, Family, Social reviewed: no change since last visit    Review of Systems  Constitutional: Negative for fever and unexpected weight change.  HENT: Negative for ear pain, nosebleeds, congestion, sore throat, rhinorrhea, sneezing, trouble swallowing, dental problem, postnasal drip and sinus pressure.   Eyes: Negative for redness and itching.  Respiratory: Positive for cough and shortness of breath. Negative for chest tightness and wheezing.   Cardiovascular: Positive for leg  swelling. Negative for palpitations.  Gastrointestinal: Negative for nausea and vomiting.  Genitourinary: Negative for dysuria.  Musculoskeletal: Positive for joint swelling.  Skin: Negative for rash.  Neurological: Negative for headaches.  Hematological: Does not bruise/bleed easily.  Psychiatric/Behavioral: Negative for dysphoric mood. The patient is not nervous/anxious.        Objective:   Physical Exam  GEN: A/Ox3; pleasant , NAD, elderly , frail   HEENT:  [place]/AT,  EACs-clear, TMs-wnl, NOSE-clear, THROAT-clear, no lesions, no postnasal drip or exudate noted.   NECK:  Supple w/ fair ROM; no JVD; normal carotid impulses w/o  bruits; no thyromegaly or nodules palpated; no lymphadenopathy.  RESP  Coarse BS , diminished in bases, no accessory muscle use, no dullness to percussion  CARD:  RRR, no m/r/g  , no peripheral edema, pulses intact, no cyanosis or clubbing.  GI:   Soft & nt; nml bowel sounds; no organomegaly or masses detected.  Musco: Warm bil, no deformities or joint swelling noted.   Neuro: alert, no focal deficits noted.    Skin: Warm, no lesions or rashes       Assessment & Plan:

## 2012-04-13 NOTE — Patient Instructions (Addendum)
I think you should attend pulmonary rehab; will re-refer you again Continue your medications and inhalers  Return in 6 months or sooner if needed

## 2012-04-23 NOTE — Assessment & Plan Note (Signed)
Class 3 dyspnea no better despite supposd medication compliance and dc of coreg. . Will re-refer him to rehab. IF he does not improve despite rehab, then suspect ageing process

## 2012-06-27 ENCOUNTER — Ambulatory Visit (HOSPITAL_COMMUNITY): Payer: Medicare Other

## 2012-06-29 ENCOUNTER — Ambulatory Visit (HOSPITAL_COMMUNITY): Payer: Medicare Other

## 2012-07-04 ENCOUNTER — Telehealth (HOSPITAL_COMMUNITY): Payer: Self-pay | Admitting: *Deleted

## 2012-07-04 ENCOUNTER — Ambulatory Visit (HOSPITAL_COMMUNITY): Payer: Medicare Other

## 2012-07-04 NOTE — Telephone Encounter (Signed)
[name] was scheduled for orientation to Pulmonary Rehab on 06/29/2012.  He did not come, we were unable to reach him by phone.

## 2012-07-06 ENCOUNTER — Ambulatory Visit (HOSPITAL_COMMUNITY): Payer: Medicare Other

## 2012-07-11 ENCOUNTER — Ambulatory Visit (HOSPITAL_COMMUNITY): Payer: Medicare Other

## 2012-07-13 ENCOUNTER — Ambulatory Visit (HOSPITAL_COMMUNITY): Payer: Medicare Other

## 2012-07-18 ENCOUNTER — Ambulatory Visit (HOSPITAL_COMMUNITY): Payer: Medicare Other

## 2012-07-20 ENCOUNTER — Ambulatory Visit (HOSPITAL_COMMUNITY): Payer: Medicare Other

## 2012-07-25 ENCOUNTER — Ambulatory Visit (HOSPITAL_COMMUNITY): Payer: Medicare Other

## 2012-07-27 ENCOUNTER — Ambulatory Visit (HOSPITAL_COMMUNITY): Payer: Medicare Other

## 2012-08-01 ENCOUNTER — Ambulatory Visit (HOSPITAL_COMMUNITY): Payer: Medicare Other

## 2012-08-03 ENCOUNTER — Ambulatory Visit (HOSPITAL_COMMUNITY): Payer: Medicare Other

## 2012-08-14 ENCOUNTER — Encounter (HOSPITAL_COMMUNITY): Payer: Self-pay | Admitting: Emergency Medicine

## 2012-08-14 ENCOUNTER — Emergency Department (HOSPITAL_COMMUNITY)
Admission: EM | Admit: 2012-08-14 | Discharge: 2012-08-14 | Disposition: A | Discharge status: Home / Self Care | Payer: Medicare Other | Attending: Emergency Medicine | Admitting: Emergency Medicine

## 2012-08-14 ENCOUNTER — Emergency Department (HOSPITAL_COMMUNITY): Payer: Medicare Other

## 2012-08-14 DIAGNOSIS — R4182 Altered mental status, unspecified: Secondary | ICD-10-CM | POA: Insufficient documentation

## 2012-08-14 DIAGNOSIS — Z87891 Personal history of nicotine dependence: Secondary | ICD-10-CM | POA: Insufficient documentation

## 2012-08-14 DIAGNOSIS — J4489 Other specified chronic obstructive pulmonary disease: Secondary | ICD-10-CM | POA: Insufficient documentation

## 2012-08-14 DIAGNOSIS — I509 Heart failure, unspecified: Secondary | ICD-10-CM | POA: Insufficient documentation

## 2012-08-14 DIAGNOSIS — J449 Chronic obstructive pulmonary disease, unspecified: Secondary | ICD-10-CM | POA: Insufficient documentation

## 2012-08-14 DIAGNOSIS — Z862 Personal history of diseases of the blood and blood-forming organs and certain disorders involving the immune mechanism: Secondary | ICD-10-CM | POA: Insufficient documentation

## 2012-08-14 DIAGNOSIS — Z8709 Personal history of other diseases of the respiratory system: Secondary | ICD-10-CM | POA: Insufficient documentation

## 2012-08-14 DIAGNOSIS — I252 Old myocardial infarction: Secondary | ICD-10-CM | POA: Insufficient documentation

## 2012-08-14 DIAGNOSIS — IMO0002 Reserved for concepts with insufficient information to code with codable children: Secondary | ICD-10-CM | POA: Insufficient documentation

## 2012-08-14 DIAGNOSIS — Z8719 Personal history of other diseases of the digestive system: Secondary | ICD-10-CM | POA: Insufficient documentation

## 2012-08-14 DIAGNOSIS — I10 Essential (primary) hypertension: Secondary | ICD-10-CM | POA: Insufficient documentation

## 2012-08-14 DIAGNOSIS — Z7982 Long term (current) use of aspirin: Secondary | ICD-10-CM | POA: Insufficient documentation

## 2012-08-14 DIAGNOSIS — Z8701 Personal history of pneumonia (recurrent): Secondary | ICD-10-CM | POA: Insufficient documentation

## 2012-08-14 DIAGNOSIS — Z79899 Other long term (current) drug therapy: Secondary | ICD-10-CM | POA: Insufficient documentation

## 2012-08-14 DIAGNOSIS — Z8679 Personal history of other diseases of the circulatory system: Secondary | ICD-10-CM | POA: Insufficient documentation

## 2012-08-14 DIAGNOSIS — Z931 Gastrostomy status: Secondary | ICD-10-CM | POA: Insufficient documentation

## 2012-08-14 LAB — URINALYSIS, ROUTINE W REFLEX MICROSCOPIC
Glucose, UA: NEGATIVE mg/dL
Leukocytes, UA: NEGATIVE
Nitrite: NEGATIVE
Protein, ur: NEGATIVE mg/dL

## 2012-08-14 LAB — COMPREHENSIVE METABOLIC PANEL
Albumin: 3.1 g/dL — ABNORMAL LOW (ref 3.5–5.2)
BUN: 24 mg/dL — ABNORMAL HIGH (ref 6–23)
Chloride: 99 mEq/L (ref 96–112)
Creatinine, Ser: 1.32 mg/dL (ref 0.50–1.35)
GFR calc Af Amer: 53 mL/min — ABNORMAL LOW (ref >90)
Total Bilirubin: 0.3 mg/dL (ref 0.3–1.2)
Total Protein: 6.8 g/dL (ref 6.0–8.3)

## 2012-08-14 LAB — CBC
HCT: 39 % (ref 39.0–52.0)
MCHC: 33.8 g/dL (ref 30.0–36.0)
MCV: 90.7 fL (ref 78.0–100.0)
RDW: 13.4 % (ref 11.5–15.5)

## 2012-08-14 MED ORDER — SODIUM CHLORIDE 0.9 % IV BOLUS (SEPSIS)
500.0000 mL | Freq: Once | INTRAVENOUS | Status: AC
  Administered 2012-08-14: 500 mL via INTRAVENOUS

## 2012-08-14 NOTE — ED Provider Notes (Signed)
History     CSN: [id number]  Arrival date & time 08/14/12  1241   First MD Initiated Contact with Patient 08/14/12 1253      Chief Complaint  Patient presents with  . Loss of Consciousness    (Consider location/radiation/quality/duration/timing/severity/associated sxs/prior treatment) Patient is a 78 y.o. male presenting with syncope. The history is provided by the patient and a relative.  Loss of Consciousness Pertinent negatives include no chest pain, no abdominal pain, no headaches and no shortness of breath.  pt from home w altered mental status. Pt lives w family, gets meds and feeds per g tube.  This morning pt was sleeping, family member gave him his normal meds via tube, when pt awoke later he seemed tired/drowsy/confused.  On arrival to ED bed, pt resting, is easily aroused, denies any c/o. Denies any pain. No headache. No cp or sob. No abd pain. No nvd. No gu c/o. Pt denies fever or chills. Denies any recent trauma or fall. No numbness/weakness. No problems w speech or vision. Pt states feels fine.  Pt/family deny any recent change in meds or new meds.     Past Medical History  Diagnosis Date  . Necrotizing pneumonia 03-20-2009  . Nodule of left lung   . Nodule of right lung     resolved  . HTN (hypertension)   . Esophageal stricture   . Dysphagia   . Hiccups   . Chronic atrial fibrillation   . Anemia   . COPD (chronic obstructive pulmonary disease)   . Coronary heart disease   . Hiatal hernia   . Heart attack 09-2007  . Abdominal aneurysm     Past Surgical History  Procedure Date  . Eye surgery 2003    right eye macular hole repair  . Coronary stent placement     x2    Family History  Problem Relation Age of Onset  . Breast cancer Sister   . Cancer Sister   . Cancer Brother   . Cancer Brother   . Cancer Brother     History  Substance Use Topics  . Smoking status: Former Smoker -- 1.0 packs/day for 30 years    Types: Cigarettes    Quit date:  09/06/2004  . Smokeless tobacco: Never Used  . Alcohol Use: No     Comment: hx of ETOH abuse, quit x 64yrs ago      Review of Systems  Constitutional: Negative for fever and chills.  HENT: Negative for neck pain.   Eyes: Negative for redness and visual disturbance.  Respiratory: Negative for cough and shortness of breath.   Cardiovascular: Positive for syncope. Negative for chest pain.  Gastrointestinal: Negative for vomiting, abdominal pain and diarrhea.  Genitourinary: Negative for dysuria and flank pain.  Musculoskeletal: Negative for back pain.  Skin: Negative for rash.  Neurological: Negative for weakness, numbness and headaches.  Hematological: Does not bruise/bleed easily.  Psychiatric/Behavioral: Positive for confusion.    Allergies  Review of patient's allergies indicates no known allergies.  Home Medications   Current Outpatient Rx  Name  Route  Sig  Dispense  Refill  . ACETAMINOPHEN-CODEINE 120-12 MG/5ML PO SOLN   PEG Tube   10 mLs by PEG Tube route every 4 (four) hours as needed. For pain         . ALBUTEROL SULFATE (2.5 MG/3ML) 0.083% IN NEBU   Nebulization   Take 2.5 mg by nebulization every 6 (six) hours as needed. For shortness of breath         .  AMLODIPINE BESYLATE 5 MG PO TABS   Per Tube   Place 1 tablet (5 mg total) into feeding tube daily.   30 tablet   3   . ASPIRIN 81 MG PO TABS   PEG Tube   81 mg by PEG Tube route daily.         Marland Kitchen BACLOFEN 10 MG PO TABS   PEG Tube   10 mg by PEG Tube route 3 (three) times daily.          . BUDESONIDE 0.5 MG/2ML IN SUSP   Nebulization   Take 0.5 mg by nebulization 2 (two) times daily as needed. For shortness of breath         . CHLORPROMAZINE HCL PO   PEG Tube   50 mg by PEG Tube route 2 (two) times daily. 100mg /ml soln         . ESOMEPRAZOLE MAGNESIUM 40 MG PO CPDR   PEG Tube   40 mg by PEG Tube route daily before breakfast.           . FERROUS SULFATE DRIED ER 160 (50 FE) MG PO  TBCR   PEG Tube   1 tablet by PEG Tube route 2 (two) times daily.          . IPRATROPIUM BROMIDE 0.02 % IN SOLN   Nebulization   Take 500 mcg by nebulization 2 (two) times daily as needed. For shortness of breath         . LOSARTAN POTASSIUM 50 MG PO TABS   PEG Tube   50 mg by PEG Tube route 2 (two) times daily.         Marland Kitchen JEVITY PLUS PO LIQD   PEG Tube   300 mLs by PEG Tube route 3 (three) times daily. at 7am, 12pm, 5pm. Each time tube flushes past tube feeds         . SILODOSIN 8 MG PO CAPS   PEG Tube   8 mg by PEG Tube route daily with breakfast.            BP 134/69  Pulse 84  Temp 97.4 F (36.3 C) (Oral)  Resp 14  SpO2 98%  Physical Exam  Nursing note and vitals reviewed. Constitutional: He is oriented to person, place, and time. He appears well-developed and well-nourished. No distress.  HENT:  Head: Atraumatic.  Eyes: Conjunctivae normal are normal. Pupils are equal, round, and reactive to light. No scleral icterus.  Neck: Neck supple. No tracheal deviation present.  Cardiovascular: Normal rate, regular rhythm, normal heart sounds and intact distal pulses.   Pulmonary/Chest: Effort normal and breath sounds normal. No accessory muscle usage. No respiratory distress.  Abdominal: Soft. Bowel sounds are normal. He exhibits no distension. There is no tenderness.       g tube site intact without sign of infection to area.  Genitourinary:       No cva tenderness  Musculoskeletal: Normal range of motion. He exhibits no edema and no tenderness.  Neurological: He is alert and oriented to person, place, and time. No cranial nerve deficit.       Pt resting. Easily aroused. Speech clear/fluent. Motor intact bil.   Skin: Skin is warm and dry.  Psychiatric: He has a normal mood and affect.    ED Course  Procedures (including critical care time)  Labs Reviewed  CBC - Abnormal; Notable for the following:    WBC 3.8 (*)     Platelets  124 (*)     All  other components within normal limits  COMPREHENSIVE METABOLIC PANEL  URINALYSIS, ROUTINE W REFLEX MICROSCOPIC    Results for orders placed during the hospital encounter of 08/14/12  CBC      Component Value Range   WBC 3.8 (*) 4.0 - 10.5 K/uL   RBC 4.30  4.22 - 5.81 MIL/uL   Hemoglobin 13.2  13.0 - 17.0 g/dL   HCT 29.5  28.4 - 13.2 %   MCV 90.7  78.0 - 100.0 fL   MCH 30.7  26.0 - 34.0 pg   MCHC 33.8  30.0 - 36.0 g/dL   RDW 44.0  10.2 - 72.5 %   Platelets 124 (*) 150 - 400 K/uL  COMPREHENSIVE METABOLIC PANEL      Component Value Range   Sodium 134 (*) 135 - 145 mEq/L   Potassium 4.5  3.5 - 5.1 mEq/L   Chloride 99  96 - 112 mEq/L   CO2 29  19 - 32 mEq/L   Glucose, Bld 112 (*) 70 - 99 mg/dL   BUN 24 (*) 6 - 23 mg/dL   Creatinine, Ser 3.66  0.50 - 1.35 mg/dL   Calcium 9.3  8.4 - 44.0 mg/dL   Total Protein 6.8  6.0 - 8.3 g/dL   Albumin 3.1 (*) 3.5 - 5.2 g/dL   AST 20  0 - 37 U/L   ALT 17  0 - 53 U/L   Alkaline Phosphatase 58  39 - 117 U/L   Total Bilirubin 0.3  0.3 - 1.2 mg/dL   GFR calc non Af Amer 45 (*) >90 mL/min   GFR calc Af Amer 53 (*) >90 mL/min  URINALYSIS, ROUTINE W REFLEX MICROSCOPIC      Component Value Range   Color, Urine YELLOW  YELLOW   APPearance CLEAR  CLEAR   Specific Gravity, Urine 1.019  1.005 - 1.030   pH 7.5  5.0 - 8.0   Glucose, UA NEGATIVE  NEGATIVE mg/dL   Hgb urine dipstick NEGATIVE  NEGATIVE   Bilirubin Urine NEGATIVE  NEGATIVE   Ketones, ur NEGATIVE  NEGATIVE mg/dL   Protein, ur NEGATIVE  NEGATIVE mg/dL   Urobilinogen, UA 1.0  0.0 - 1.0 mg/dL   Nitrite NEGATIVE  NEGATIVE   Leukocytes, UA NEGATIVE  NEGATIVE   Ct Head Wo Contrast  08/14/2012  *RADIOLOGY REPORT*  Clinical Data: Altered mental status  CT HEAD WITHOUT CONTRAST  Technique:  Contiguous axial images were obtained from the base of the skull through the vertex without contrast.  Comparison: 03/29/2011  Findings: Mild atrophy.  No acute infarct.  Negative for hemorrhage or mass lesion.   No midline shift.  Calvarium is intact.  IMPRESSION: Generalized atrophy.  No acute abnormality.   Original Report Authenticated By: Janeece Riggers, M.D.       MDM  Iv ns. Labs. Ct.   Reviewed nursing notes and prior charts for additional history.   Additional family member, daughter present, states mental status appears c/w baseline.   500 cc ns iv in ed.  Recheck awake and alert.  Continues to deny pain or other c/o.   Pt appears stable for d/c. Close pcp f/u.       Suzi Roots, MD 08/14/12 [id number]

## 2012-08-14 NOTE — ED Notes (Signed)
[facility] called  

## 2012-08-14 NOTE — ED Notes (Signed)
Pt was sitting on couch and unresponsive to family, when ems arrived, pt was A&O x 4.

## 2012-08-14 NOTE — ED Notes (Signed)
[facility] contacted for transport 

## 2012-08-26 ENCOUNTER — Other Ambulatory Visit (HOSPITAL_COMMUNITY): Payer: Self-pay | Admitting: Gastroenterology

## 2012-08-26 DIAGNOSIS — IMO0002 Reserved for concepts with insufficient information to code with codable children: Secondary | ICD-10-CM

## 2012-08-28 ENCOUNTER — Ambulatory Visit (HOSPITAL_COMMUNITY)
Admission: RE | Admit: 2012-08-28 | Discharge: 2012-08-28 | Disposition: A | Discharge status: Home / Self Care | Payer: Medicare Other | Source: Ambulatory Visit | Attending: Gastroenterology | Admitting: Gastroenterology

## 2012-08-28 ENCOUNTER — Other Ambulatory Visit (HOSPITAL_COMMUNITY): Payer: Medicare Other

## 2012-08-28 DIAGNOSIS — K9423 Gastrostomy malfunction: Secondary | ICD-10-CM | POA: Insufficient documentation

## 2012-08-28 DIAGNOSIS — IMO0002 Reserved for concepts with insufficient information to code with codable children: Secondary | ICD-10-CM

## 2012-08-28 DIAGNOSIS — Y849 Medical procedure, unspecified as the cause of abnormal reaction of the patient, or of later complication, without mention of misadventure at the time of the procedure: Secondary | ICD-10-CM | POA: Insufficient documentation

## 2012-08-28 MED ORDER — IOHEXOL 300 MG/ML  SOLN
50.0000 mL | Freq: Once | INTRAMUSCULAR | Status: AC | PRN
  Administered 2012-08-28: 10 mL

## 2012-08-28 NOTE — Procedures (Signed)
Successful 94fr G tube exchg No comp Ready for use Confirmed in the stomach

## 2012-08-29 ENCOUNTER — Telehealth (HOSPITAL_COMMUNITY): Payer: Self-pay | Admitting: *Deleted

## 2012-08-29 NOTE — Telephone Encounter (Signed)
Attempted to call pt and pt's daughter Angelique Blonder for post procedure follow up call.  No answer at all 3 numbers provided with no available identified VM

## 2012-10-10 ENCOUNTER — Encounter: Payer: Self-pay | Admitting: Internal Medicine

## 2012-10-10 ENCOUNTER — Ambulatory Visit (INDEPENDENT_AMBULATORY_CARE_PROVIDER_SITE_OTHER): Payer: Medicare Other | Admitting: Internal Medicine

## 2012-10-10 VITALS — BP 140/80 | HR 87 | Temp 98.2°F | Ht 69.0 in | Wt 145.8 lb

## 2012-10-10 DIAGNOSIS — J441 Chronic obstructive pulmonary disease with (acute) exacerbation: Secondary | ICD-10-CM

## 2012-10-10 MED ORDER — DOXYCYCLINE HYCLATE 100 MG PO TABS: 100.0000 mg | ORAL_TABLET | Freq: Two times a day (BID) | ORAL | Status: DC

## 2012-10-10 MED ORDER — ALBUTEROL SULFATE (2.5 MG/3ML) 0.083% IN NEBU
2.5000 mg | INHALATION_SOLUTION | Freq: Once | RESPIRATORY_TRACT | Status: AC
  Administered 2012-10-10: 2.5 mg via RESPIRATORY_TRACT

## 2012-10-10 MED ORDER — METHYLPREDNISOLONE ACETATE 80 MG/ML IJ SUSP
40.0000 mg | Freq: Once | INTRAMUSCULAR | Status: AC
Start: 2012-10-10 — End: 2012-10-10
  Administered 2012-10-10: 40 mg via INTRAMUSCULAR

## 2012-10-10 MED ORDER — PREDNISONE 10 MG PO TABS: ORAL_TABLET | ORAL | Status: DC

## 2012-10-10 NOTE — Progress Notes (Signed)
Subjective:    Patient ID: [name], male    DOB: Jun 26, 1919, 79 y.o.   MRN: [id number]  HPI   Problem List 1.  COPD   - Gold stage 2- s/p rehab compleetion Aug 2011. -No desaturation walking 185 feet x 3 laps in Oct 2011. Rx spiriva - New exertional hypoxema - May 2012; commenced on O2  - Non compliance with RX 2012 onwards but claimed conpliance aug 2013  -Worsened Gold stage 4 copd & class 3 dyspnea:  11/24/11:  Spirometry fev1 0.88L/36% and worst ever. Walk test: 185 feet x 3 laps: desaturated at first lap to 88%. Non compliant with rx) -   2. LUL Nodule - pseudotumor , 2-3cm, PET negative March 2011.   3. h of LLL necrotixing pna and dysphagia/peg tube - Summer 2010, Cleared March 201  4. Chronic PEG feed due to ? Achalasia - fu with Dr Loreta Ave  5. Body mass index is 21.27 kg/(m^2).   - Body mass index is 21.03 kg/(m^2). - 12/29/2011  -Body mass index is 20.47 kg/(m^2). - 04/13/2012 - Body mass index is 21.53 kg/(m^2). on 10/10/2012      6. AECOPD hx  - July 2012 - opd RX  - March 2013 - opd RX - April 7 - 12, Admit for AECOPD and RLL PNA per dc summary though cxr had no pna    OV 04/13/2012 Followup for COPD. Presenst with wife. Dyspnea is class 3 and unchanged. They boith now maintain he is compliant with all meds. In addition, also off coreg. Still no improvement in dyspnea. Denies fever, cough, sputum, worsening edema. No interim problems.   REC Rehab  OV 10/10/2012  Routine followup for COPD. As usual presents with wife. Both maintain that he is compliant with his nebulizers. For the past 2-3 weeks he is noticing worsening dyspnea on exertion. He currently is unable to walk from the chair in the office from the door in the office from without getting short of breath. This is despite oxygen use. Dyspnea is rated as moderate and relieved by rest. Dyspnea is associated with no worsening cough but increase in her sputum and possible change in color in sputum. There is no  history of any wheezing, fever, orthopnea or worsening edema or hemoptysis. He denies any chest pain   CAT COPD Symptom & Quality of Life Score (GSK trademark) 0 is no burden. 5 is highest burden 10/10/2012   Never Cough -> Cough all the time 2  No phlegm in chest -> Chest is full of phlegm 5  No chest tightness -> Chest feels very tight 0  No dyspnea for 1 flight stairs/hill -> Very dyspneic for 1 flight of stairs 5  No limitations for ADL at home -> Very limited with ADL at home 5  Confident leaving home -> Not at all confident leaving home 0  Sleep soundly -> Do not sleep soundly because of lung condition 0  Lots of Energy -> No energy at all 1  TOTAL Score (max 40)  18    Past, Family, Social reviewed: no change since last visit    Review of Systems  Constitutional: Negative for fever and unexpected weight change.  HENT: Positive for congestion and rhinorrhea. Negative for ear pain, nosebleeds, sore throat, sneezing, trouble swallowing, dental problem, postnasal drip and sinus pressure.   Eyes: Negative for redness and itching.  Respiratory: Positive for cough, chest tightness, shortness of breath and wheezing.   Cardiovascular: Negative for palpitations  and leg swelling.  Gastrointestinal: Negative for nausea and vomiting.  Genitourinary: Negative for dysuria.  Musculoskeletal: Negative for joint swelling.  Skin: Negative for rash.  Neurological: Negative for headaches.  Hematological: Does not bruise/bleed easily.  Psychiatric/Behavioral: Positive for dysphoric mood. The patient is nervous/anxious.        Objective:   Physical Exam  Nursing note and vitals reviewed. Constitutional: He is oriented to person, place, and time. He appears well-developed and well-nourished. No distress.       Sitt with cup as before for his saliva due to his esophageal obstruction. Looks a bit more tired than usual  HENT:  Head: Normocephalic and atraumatic.  Right Ear: External ear normal.   Left Ear: External ear normal.  Mouth/Throat: Oropharynx is clear and moist. No oropharyngeal exudate.       Has a runny nose Nasal cannula oxygen on  Eyes: Conjunctivae normal and EOM are normal. Pupils are equal, round, and reactive to light. Right eye exhibits no discharge. Left eye exhibits no discharge. No scleral icterus.  Neck: Normal range of motion. Neck supple. No JVD present. No tracheal deviation present. No thyromegaly present.  Cardiovascular: Normal rate, regular rhythm and intact distal pulses.  Exam reveals no gallop and no friction rub.   No murmur heard. Pulmonary/Chest: Effort normal and breath sounds normal. No respiratory distress. He has no wheezes. He has no rales. He exhibits no tenderness.  Abdominal: Soft. Bowel sounds are normal. He exhibits no distension and no mass. There is no tenderness. There is no rebound and no guarding.  Musculoskeletal: Normal range of motion. He exhibits no edema and no tenderness.  Lymphadenopathy:    He has no cervical adenopathy.  Neurological: He is alert and oriented to person, place, and time. He has normal reflexes. No cranial nerve deficit. Coordination normal.  Skin: Skin is warm and dry. No rash noted. He is not diaphoretic. No erythema. No pallor.  Psychiatric: He has a normal mood and affect. His behavior is normal. Judgment and thought content normal.          Assessment & Plan:

## 2012-10-10 NOTE — Assessment & Plan Note (Signed)
He appears to be in mild acute exacerbation of COPD  Plan - Continue all COPD medications Treated in office with albuterol nebulizer and IM Depo-Medrol 40 mg Discharge home On short-term doxycycline and 12 day prednisone taper Instructed Them to call the office if not better or worse or go to the emergency room Followup with me in 4-5 months  -

## 2012-10-10 NOTE — Patient Instructions (Addendum)
Have nebulizer albuterol in office right now Have IM depot medrol 40mg  in office right now You have mild attack of copd called COPD exacerbation Please take doxycycline 100mg  twice daily after meals x 5 days; avoid sunlight Please take prednisone 40mg  once daily x 3 days, then 20mg  once daily x 3 days, then 10mg  once daily x 3 days, then 5mg  once dailyx 3 days and stop If not getting better in 24-48h call us REturn in 5 months or sooner if needed

## 2013-03-14 ENCOUNTER — Ambulatory Visit (INDEPENDENT_AMBULATORY_CARE_PROVIDER_SITE_OTHER)
Admission: RE | Admit: 2013-03-14 | Discharge: 2013-03-14 | Disposition: A | Discharge status: Home / Self Care | Payer: Medicare Other | Source: Ambulatory Visit | Attending: Internal Medicine | Admitting: Internal Medicine

## 2013-03-14 ENCOUNTER — Ambulatory Visit (INDEPENDENT_AMBULATORY_CARE_PROVIDER_SITE_OTHER): Payer: Medicare Other | Admitting: Internal Medicine

## 2013-03-14 ENCOUNTER — Encounter: Payer: Self-pay | Admitting: Internal Medicine

## 2013-03-14 VITALS — BP 140/80 | HR 86 | Ht 69.0 in | Wt 144.6 lb

## 2013-03-14 DIAGNOSIS — R042 Hemoptysis: Secondary | ICD-10-CM

## 2013-03-14 DIAGNOSIS — J441 Chronic obstructive pulmonary disease with (acute) exacerbation: Secondary | ICD-10-CM

## 2013-03-14 DIAGNOSIS — J449 Chronic obstructive pulmonary disease, unspecified: Secondary | ICD-10-CM

## 2013-03-14 DIAGNOSIS — R5381 Other malaise: Secondary | ICD-10-CM

## 2013-03-14 MED ORDER — DOXYCYCLINE HYCLATE 100 MG PO TABS: 100.0000 mg | ORAL_TABLET | Freq: Two times a day (BID) | ORAL | Status: DC

## 2013-03-14 MED ORDER — PREDNISONE 10 MG PO TABS: ORAL_TABLET | ORAL | Status: DC

## 2013-03-14 NOTE — Patient Instructions (Addendum)
#  AECOPD  - you might be having mild aecopd  - Take doxycycline 100mg  po twice daily x 5 days; take after meals and avoid sunlight Take prednisone 40 mg daily x 2 days, then 20mg  daily x 2 days, then 10mg  daily x 2 days, then 5mg  daily x 2 days and stop - CXR today; will call with results  #COPD  - continue your copd medications and oxygen  - you need oxygen; you are not dependent on this. Your body just needs oxygen  #Physical deconditioning  - ordered home physical therapy  - give ensure three times  A day through feeding tube  #Followup  5 months or sooner if needed Flu shot in fall

## 2013-03-14 NOTE — Assessment & Plan Note (Signed)
 #  COPD  - continue your copd medications and oxygen  - you need oxygen; you are not dependent on this. Your body just needs oxygen   #Followup  5 months or sooner if needed Flu shot in fall

## 2013-03-14 NOTE — Assessment & Plan Note (Signed)
CXR clear. So Rx for aecopd and monitor

## 2013-03-14 NOTE — Assessment & Plan Note (Signed)
#  AECOPD  - you might be having mild aecopd  - Take doxycycline 100mg  po twice daily x 5 days; take after meals and avoid sunlight Take prednisone 40 mg daily x 2 days, then 20mg  daily x 2 days, then 10mg  daily x 2 days, then 5mg  daily x 2 days and stop   #Followup  5 months or sooner if needed Flu shot in fall

## 2013-03-14 NOTE — Assessment & Plan Note (Signed)
Order home PT .  

## 2013-03-14 NOTE — Progress Notes (Signed)
Subjective:    Patient ID: [name], male    DOB: Jan 08, 1919, 79 y.o.   MRN: [id number]  HPI  Problem List 1.  COPD   - Gold stage 2- s/p rehab compleetion Aug 2011. -No desaturation walking 185 feet x 3 laps in Oct 2011. Rx spiriva - New exertional hypoxema - May 2012; commenced on O2  - Non compliance with RX 2012 onwards but claimed conpliance aug 2013  -Worsened Gold stage 4 copd & class 3 dyspnea:  11/24/11:  Spirometry fev1 0.88L/36% and worst ever. Walk test: 185 feet x 3 laps: desaturated at first lap to 88%. Non compliant with rx) -   2. LUL Nodule - pseudotumor , 2-3cm, PET negative March 2011.   3. h of LLL necrotixing pna and dysphagia/peg tube - Summer 2010, Cleared March 201  4. Chronic PEG feed due to ? Achalasia - fu with Dr Loreta Ave  5. Body mass index is 21.27 kg/(m^2).   - Body mass index is 21.03 kg/(m^2). - 12/29/2011  -Body mass index is 20.47 kg/(m^2). - 04/13/2012 - Body mass index is 21.53 kg/(m^2). on 10/10/2012      6. AECOPD hx  - July 2012 - opd RX  - March 2013 - opd RX - April 7 - 12, Admit for AECOPD and RLL PNA per dc summary though cxr had no pna    OV 04/13/2012 Followup for COPD. Presenst with wife. Dyspnea is class 3 and unchanged. They boith now maintain he is compliant with all meds. In addition, also off coreg. Still no improvement in dyspnea. Denies fever, cough, sputum, worsening edema. No interim problems.   REC Rehab  OV 10/10/2012  Routine followup for COPD. As usual presents with wife. Both maintain that he is compliant with his nebulizers. For the past 2-3 weeks he is noticing worsening dyspnea on exertion. He currently is unable to walk from the chair in the office from the door in the office from without getting short of breath. This is despite oxygen use. Dyspnea is rated as moderate and relieved by rest. Dyspnea is associated with no worsening cough but increase in her sputum and possible change in color in sputum. There is no  history of any wheezing, fever, orthopnea or worsening edema or hemoptysis. He denies any chest pain  REC Have nebulizer albuterol in office right now Have IM depot medrol 40mg  in office right now You have mild attack of copd called COPD exacerbation Please take doxycycline 100mg  twice daily after meals x 5 days; avoid sunlight Please take prednisone 40mg  once daily x 3 days, then 20mg  once daily x 3 days, then 10mg  once daily x 3 days, then 5mg  once dailyx 3 days and stop If not getting better in 24-48h call us REturn in 5 months or sooner if needed   OV 03/14/2013  FU visit. Wife and daughte presnt  Overal no change in copd cat score but he, wife and duaghter state that last few weeks to few months general decline in physiccal conditoning. Still able to do ADLs but there is lot of fatigue. ECOG is now 3-4. Appetite poor but maintaing weight. Last few days increased cough, with new yellow sputum and one episode ofstreay small hemoptysis. Worried he is becomingdependeont on O2 but feels this is helping him   Dg Chest 2 View  03/14/2013   *RADIOLOGY REPORT*  Clinical Data: Hemoptysis  CHEST - 2 VIEW  Comparison: 12/16/2011  Findings: Cardiomediastinal silhouette is stable.  Again noted  hyperinflation and chronic interstitial prominence.  No segmental infiltrate or pulmonary edema.  Bony thorax is stable.  IMPRESSION: Again noted hyperinflation and chronic interstitial prominence.  No segmental infiltrate or pulmonary edema.  Bony thorax is stable.   Original Report Authenticated By: Natasha Mead, M.D.      CAT COPD Symptom & Quality of Life Score (GSK trademark) 0 is no burden. 5 is highest burden 10/10/2012  03/14/2013   Never Cough -> Cough all the time 2 0  No phlegm in chest -> Chest is full of phlegm 5 5  No chest tightness -> Chest feels very tight 0 0  No dyspnea for 1 flight stairs/hill -> Very dyspneic for 1 flight of stairs 5 5  No limitations for ADL at home -> Very limited with ADL  at home 5 5  Confident leaving home -> Not at all confident leaving home 0 0  Sleep soundly -> Do not sleep soundly because of lung condition 0 0  Lots of Energy -> No energy at all 1 4  TOTAL Score (max 40)  18 19    Past, Family, Social reviewed: no change since last visit    Review of Systems  Constitutional: Negative for fever and unexpected weight change.  HENT: Negative for ear pain, nosebleeds, congestion, sore throat, rhinorrhea, sneezing, trouble swallowing, dental problem, postnasal drip and sinus pressure.   Eyes: Negative for redness and itching.  Respiratory: Positive for shortness of breath. Negative for cough, chest tightness and wheezing.   Cardiovascular: Negative for palpitations and leg swelling.  Gastrointestinal: Negative for nausea and vomiting.  Genitourinary: Positive for hematuria. Negative for dysuria.  Musculoskeletal: Negative for joint swelling.  Skin: Negative for rash.  Neurological: Negative for headaches.  Hematological: Does not bruise/bleed easily.  Psychiatric/Behavioral: Negative for dysphoric mood. The patient is not nervous/anxious.        Objective:   Physical Exam  Physical Exam  Nursing note and vitals reviewed. Constitutional: He is oriented to person, place, and time. He appears well-developed and well-nourished. No distress.       Sitt with cup as before for his saliva due to his esophageal obstruction. Looks a bit more tired than usual  Sitting in wheel chair - new for 2014 HENT:  Head: Normocephalic and atraumatic.  Right Ear: External ear normal.  Left Ear: External ear normal.  Mouth/Throat: Oropharynx is clear and moist. No oropharyngeal exudate.       Nasal cannula oxygen on  Eyes: Conjunctivae normal and EOM are normal. Pupils are equal, round, and reactive to light. Right eye exhibits no discharge. Left eye exhibits no discharge. No scleral icterus.  Neck: Normal range of motion. Neck supple. No JVD present. No tracheal  deviation present. No thyromegaly present.  Cardiovascular: Normal rate, regular rhythm and intact distal pulses.  Exam reveals no gallop and no friction rub.   No murmur heard. Pulmonary/Chest: Effort normal and breath sounds normal. No respiratory distress. He has no wheezes. He has no rales. He exhibits no tenderness.  Abdominal: Soft. Bowel sounds are normal. He exhibits no distension and no mass. There is no tenderness. There is no rebound and no guarding.  Musculoskeletal: Normal range of motion. He exhibits no edema and no tenderness.  Lymphadenopathy:    He has no cervical adenopathy.  Neurological: He is alert and oriented to person, place, and time. He has normal reflexes. No cranial nerve deficit. Coordination normal.  Skin: Skin is warm and dry. No rash noted. He  is not diaphoretic. No erythema. No pallor.  Psychiatric: He has a normal mood and affect. His behavior is normal. Judgment and thought content normal.             Assessment & Plan:

## 2013-03-21 NOTE — Progress Notes (Signed)
Quick Note:  lmomtcb on pt's home # ATC # listed at pt's cell # - spoke with a lady and was advised this is not pt's #. She doesn't know him. ______

## 2013-03-22 ENCOUNTER — Telehealth: Payer: Self-pay | Admitting: Internal Medicine

## 2013-03-22 NOTE — Telephone Encounter (Signed)
Jen, Please let patient know it is normal result      Pt is aware. Carron Curie, CMA

## 2013-04-26 ENCOUNTER — Ambulatory Visit (HOSPITAL_COMMUNITY)
Admission: RE | Admit: 2013-04-26 | Discharge: 2013-04-26 | Disposition: A | Discharge status: Home / Self Care | Payer: Medicare Other | Source: Ambulatory Visit | Attending: Interventional Radiology | Admitting: Interventional Radiology

## 2013-04-26 ENCOUNTER — Other Ambulatory Visit (HOSPITAL_COMMUNITY): Payer: Self-pay | Admitting: Interventional Radiology

## 2013-04-26 DIAGNOSIS — R633 Feeding difficulties: Secondary | ICD-10-CM

## 2013-04-26 DIAGNOSIS — Z431 Encounter for attention to gastrostomy: Secondary | ICD-10-CM | POA: Insufficient documentation

## 2013-04-26 MED ORDER — IOHEXOL 300 MG/ML  SOLN
50.0000 mL | Freq: Once | INTRAMUSCULAR | Status: AC | PRN
  Administered 2013-04-26: 10 mL via INTRAVENOUS

## 2013-05-23 ENCOUNTER — Other Ambulatory Visit (HOSPITAL_COMMUNITY): Payer: Self-pay | Admitting: Cardiology

## 2013-05-23 DIAGNOSIS — M25562 Pain in left knee: Secondary | ICD-10-CM

## 2013-05-23 DIAGNOSIS — M7989 Other specified soft tissue disorders: Secondary | ICD-10-CM

## 2013-05-25 ENCOUNTER — Ambulatory Visit (HOSPITAL_COMMUNITY)
Admission: RE | Admit: 2013-05-25 | Discharge: 2013-05-25 | Disposition: A | Discharge status: Home / Self Care | Payer: Medicare Other | Source: Ambulatory Visit | Attending: Cardiology | Admitting: Cardiology

## 2013-05-25 DIAGNOSIS — M7989 Other specified soft tissue disorders: Secondary | ICD-10-CM

## 2013-05-25 DIAGNOSIS — M79609 Pain in unspecified limb: Secondary | ICD-10-CM | POA: Insufficient documentation

## 2013-05-25 DIAGNOSIS — M25562 Pain in left knee: Secondary | ICD-10-CM

## 2013-05-25 NOTE — Progress Notes (Signed)
*  Preliminary Results* Left lower extremity venous duplex completed. Left lower extremity is negative for deep vein thrombosis. There is no evidence of left Baker's cyst.  Attempted to call preliminary results to Dr. Magda Kiel office, however the office is closed today. The patient will be discharged home and can be reached by phone if necessary.  05/25/2013 11:17 AM  Gertie Fey, RVT, RDCS, RDMS

## 2013-07-16 ENCOUNTER — Other Ambulatory Visit (HOSPITAL_COMMUNITY): Payer: Self-pay | Admitting: Urology

## 2013-07-16 DIAGNOSIS — C61 Malignant neoplasm of prostate: Secondary | ICD-10-CM

## 2013-08-01 ENCOUNTER — Encounter (HOSPITAL_COMMUNITY)
Admission: RE | Admit: 2013-08-01 | Discharge: 2013-08-01 | Disposition: A | Discharge status: Home / Self Care | Payer: Medicare Other | Source: Ambulatory Visit | Attending: Urology | Admitting: Urology

## 2013-08-01 DIAGNOSIS — C61 Malignant neoplasm of prostate: Secondary | ICD-10-CM

## 2013-08-01 MED ORDER — TECHNETIUM TC 99M MEDRONATE IV KIT
26.0000 | PACK | Freq: Once | INTRAVENOUS | Status: AC | PRN
  Administered 2013-08-01: 26 via INTRAVENOUS

## 2013-08-17 ENCOUNTER — Ambulatory Visit: Payer: Medicare Other | Admitting: Internal Medicine

## 2013-08-27 ENCOUNTER — Ambulatory Visit (INDEPENDENT_AMBULATORY_CARE_PROVIDER_SITE_OTHER): Payer: Medicare Other | Admitting: Internal Medicine

## 2013-08-27 ENCOUNTER — Encounter: Payer: Self-pay | Admitting: Internal Medicine

## 2013-08-27 VITALS — BP 120/78 | HR 88 | Ht 69.0 in | Wt 139.6 lb

## 2013-08-27 DIAGNOSIS — J449 Chronic obstructive pulmonary disease, unspecified: Secondary | ICD-10-CM

## 2013-08-27 DIAGNOSIS — M7989 Other specified soft tissue disorders: Secondary | ICD-10-CM

## 2013-08-27 NOTE — Patient Instructions (Signed)
There is lot of physical deconditioning due to copd and aging going on YOu need home Physical therapy program or home copd program  - I tried to set this up last time; we need to make sure this happens HAve DUplex Lower Extremity to rule out DVT due to leg swelling Return in 3 months 

## 2013-08-27 NOTE — Progress Notes (Signed)
Subjective:    Patient ID: [name], male    DOB: [birth year], 79 y.o.   MRN: [id number]  HPI  HPI  Problem List 1.  COPD   - Gold stage 2- s/p rehab compleetion Aug 2011. -No desaturation walking 185 feet x 3 laps in Oct 2011. Rx spiriva - New exertional hypoxema - May 2012; commenced on O2  - Non compliance with RX 2012 onwards but claimed conpliance aug 2013  -Worsened Gold stage 4 copd & class 3 dyspnea:  11/24/11:  Spirometry fev1 0.88L/36% and worst ever. Walk test: 185 feet x 3 laps: desaturated at first lap to 88%. Non compliant with rx) -   2. LUL Nodule - pseudotumor , 2-3cm, PET negative March 2011.   3. h of LLL necrotixing pna and dysphagia/peg tube - Summer 2010, Cleared March 201  4. Chronic PEG feed due to ? Achalasia - fu with Dr Loreta Ave  5. Body mass index is 21.27 kg/(m^2).   - Body mass index is 21.03 kg/(m^2). - 12/29/2011  -Body mass index is 20.47 kg/(m^2). - 04/13/2012 - Body mass index is 21.53 kg/(m^2). on 10/10/2012      6. AECOPD hx  - July 2012 - opd RX  - March 2013 - opd RX - April 7 - 12, Admit for AECOPD and RLL PNA per dc summary though cxr had no pna    OV 04/13/2012 Followup for COPD. Presenst with wife. Dyspnea is class 3 and unchanged. They boith now maintain he is compliant with all meds. In addition, also off coreg. Still no improvement in dyspnea. Denies fever, cough, sputum, worsening edema. No interim problems.   REC Rehab  OV 10/10/2012  Routine followup for COPD. As usual presents with wife. Both maintain that he is compliant with his nebulizers. For the past 2-3 weeks he is noticing worsening dyspnea on exertion. He currently is unable to walk from the chair in the office from the door in the office from without getting short of breath. This is despite oxygen use. Dyspnea is rated as moderate and relieved by rest. Dyspnea is associated with no worsening cough but increase in her sputum and possible change in color in sputum. There  is no history of any wheezing, fever, orthopnea or worsening edema or hemoptysis. He denies any chest pain  REC Have nebulizer albuterol in office right now Have IM depot medrol 40mg  in office right now You have mild attack of copd called COPD exacerbation Please take doxycycline 100mg  twice daily after meals x 5 days; avoid sunlight Please take prednisone 40mg  once daily x 3 days, then 20mg  once daily x 3 days, then 10mg  once daily x 3 days, then 5mg  once dailyx 3 days and stop If not getting better in 24-48h call us REturn in 5 months or sooner if needed   OV 03/14/2013  FU visit. Wife and daughte presnt  Overal no change in copd cat score but he, wife and duaghter state that last few weeks to few months general decline in physiccal conditoning. Still able to do ADLs but there is lot of fatigue. ECOG is now 3-4. Appetite poor but maintaing weight. Last few days increased cough, with new yellow sputum and one episode ofstreay small hemoptysis. Worried he is becomingdependeont on O2 but feels this is helping him   Dg Chest 2 View  03/14/2013   *RADIOLOGY REPORT*  Clinical Data: Hemoptysis  CHEST - 2 VIEW  Comparison: 12/16/2011  Findings: Cardiomediastinal silhouette is stable.  Again noted hyperinflation and chronic interstitial prominence.  No segmental infiltrate or pulmonary edema.  Bony thorax is stable.  IMPRESSION: Again noted hyperinflation and chronic interstitial prominence.  No segmental infiltrate or pulmonary edema.  Bony thorax is stable.   Original Report Authenticated By: Natasha Mead, M.D.    Sudie Grumbling 08/27/2013  Chief Complaint  Patient presents with  . Follow-up    Pt c/o increased SOB with activity. Pt states cough and congestion are same.    FU visit  AT last visit in July 2014 Rx for AECOPD and ordered home PT due to sudden deconditoning. BUIt not sure why. Home PT nevere happened. Wife reports progressive deconditoning. STill able to walk and do activities but she feels  home PT would help. He is slow on his fee. He says 2 months of gradually worsening dyspnea and several months of mild left calf swelling (I do not appreciate it). Dednies chest pain, aspiration, hemoptysis, cough, pnd, orthopnea.    CAT COPD Symptom & Quality of Life Score (GSK trademark) 0 is no burden. 5 is highest burden 10/10/2012  03/14/2013   Never Cough -> Cough all the time 2 0  No phlegm in chest -> Chest is full of phlegm 5 5  No chest tightness -> Chest feels very tight 0 0  No dyspnea for 1 flight stairs/hill -> Very dyspneic for 1 flight of stairs 5 5  No limitations for ADL at home -> Very limited with ADL at home 5 5  Confident leaving home -> Not at all confident leaving home 0 0  Sleep soundly -> Do not sleep soundly because of lung condition 0 0  Lots of Energy -> No energy at all 1 4  TOTAL Score (max 40)  18 19      Review of Systems  Constitutional: Negative for fever and unexpected weight change.  HENT: Negative for congestion, dental problem, ear pain, nosebleeds, postnasal drip, rhinorrhea, sinus pressure, sneezing, sore throat and trouble swallowing.   Eyes: Negative for redness and itching.  Respiratory: Positive for cough and shortness of breath. Negative for chest tightness and wheezing.   Cardiovascular: Negative for palpitations and leg swelling.  Gastrointestinal: Negative for nausea and vomiting.  Genitourinary: Negative for dysuria.  Musculoskeletal: Negative for joint swelling.  Skin: Negative for rash.  Neurological: Negative for headaches.  Hematological: Does not bruise/bleed easily.  Psychiatric/Behavioral: Negative for dysphoric mood. The patient is not nervous/anxious.        Objective:   Physical Exam   Nursing note and vitals reviewed. Constitutional: He is oriented to person, place, and time. He appears well-developed and well-nourished. No distress.       Sitt with cup as before for his saliva due to his esophageal obstruction.   DEfnite deconditioned; not present up until July 2014 HENT:  Head: Normocephalic and atraumatic.  Right Ear: External ear normal.  Left Ear: External ear normal.  Mouth/Throat: Oropharynx is clear and moist. No oropharyngeal exudate.       Nasal cannula oxygen on  Eyes: Conjunctivae normal and EOM are normal. Pupils are equal, round, and reactive to light. Right eye exhibits no discharge. Left eye exhibits no discharge. No scleral icterus.  Neck: Normal range of motion. Neck supple. No JVD present. No tracheal deviation present. No thyromegaly present.  Cardiovascular: Normal rate, regular rhythm and intact distal pulses.  Exam reveals no gallop and no friction rub.   No murmur heard. Pulmonary/Chest: Effort normal and breath sounds normal. No respiratory  distress. He has no wheezes. He has no rales. He exhibits no tenderness.  Abdominal: Soft. Bowel sounds are normal. He exhibits no distension and no mass. There is no tenderness. There is no rebound and no guarding.  Musculoskeletal: Normal range of motion. He exhibits no edema and no tenderness.  Lymphadenopathy:    He has no cervical adenopathy.  Neurological: He is alert and oriented to person, place, and time. He has normal reflexes. No cranial nerve deficit. Coordination normal.  Skin: Skin is warm and dry. No rash noted. He is not diaphoretic. No erythema. No pallor.  Psychiatric: He has a normal mood and affect. His behavior is normal. Judgment and thought content normal.        Assessment & Plan:

## 2013-08-30 DIAGNOSIS — M7989 Other specified soft tissue disorders: Secondary | ICD-10-CM | POA: Insufficient documentation

## 2013-08-30 NOTE — Assessment & Plan Note (Signed)
There is lot of physical deconditioning due to copd and aging going on YOu need home Physical therapy program or home copd program  - I tried to set this up last time; we need to make sure this happens HAve DUplex Lower Extremity to rule out DVT due to leg swelling Return in 3 months

## 2013-08-30 NOTE — Assessment & Plan Note (Signed)
Get duplex DVT

## 2013-08-31 ENCOUNTER — Telehealth: Payer: Self-pay | Admitting: Internal Medicine

## 2013-08-31 NOTE — Telephone Encounter (Signed)
Severe chronic obstructive pulmonary disease - Kalman Shan, MD at 08/30/2013  6:36 PM      Status: Written Related Problem: Severe chronic obstructive pulmonary disease    There is lot of physical deconditioning due to copd and aging going on YOu need home Physical therapy program or home copd program   I called and spoke with Melissa. I advised her pt needs both physical therapy and skilled nursey. D/t reason stated above. Nothing further needed

## 2013-09-04 ENCOUNTER — Encounter: Payer: Self-pay | Admitting: Cardiovascular Disease

## 2013-09-04 ENCOUNTER — Ambulatory Visit (HOSPITAL_COMMUNITY): Payer: Medicare Other | Attending: Cardiovascular Disease

## 2013-09-04 DIAGNOSIS — J449 Chronic obstructive pulmonary disease, unspecified: Secondary | ICD-10-CM | POA: Insufficient documentation

## 2013-09-04 DIAGNOSIS — R609 Edema, unspecified: Secondary | ICD-10-CM

## 2013-09-04 DIAGNOSIS — Z87891 Personal history of nicotine dependence: Secondary | ICD-10-CM | POA: Insufficient documentation

## 2013-09-04 DIAGNOSIS — J4489 Other specified chronic obstructive pulmonary disease: Secondary | ICD-10-CM | POA: Insufficient documentation

## 2013-09-04 DIAGNOSIS — R0602 Shortness of breath: Secondary | ICD-10-CM | POA: Insufficient documentation

## 2013-09-04 DIAGNOSIS — I10 Essential (primary) hypertension: Secondary | ICD-10-CM | POA: Insufficient documentation

## 2013-09-04 DIAGNOSIS — M7989 Other specified soft tissue disorders: Secondary | ICD-10-CM

## 2013-09-13 ENCOUNTER — Telehealth: Payer: Self-pay | Admitting: Internal Medicine

## 2013-09-13 NOTE — Telephone Encounter (Signed)
Notes Recorded by Brand Males, MD on 09/10/2013 at 9:38 AM No DVT. Please check if Home PT is visiting hium  Pt spouse aware of results. Per spouse PT came out and saw pt yesterday. Nothing further needed

## 2014-01-25 ENCOUNTER — Ambulatory Visit (INDEPENDENT_AMBULATORY_CARE_PROVIDER_SITE_OTHER): Payer: Medicare Other | Admitting: Internal Medicine

## 2014-01-25 ENCOUNTER — Encounter: Payer: Self-pay | Admitting: Internal Medicine

## 2014-01-25 VITALS — BP 132/70 | HR 85 | Ht 67.0 in | Wt 139.2 lb

## 2014-01-25 DIAGNOSIS — J449 Chronic obstructive pulmonary disease, unspecified: Secondary | ICD-10-CM

## 2014-01-25 NOTE — Progress Notes (Signed)
Subjective:    Patient ID: [name], male    DOB: [birth year], 80 y.o.   MRN: [id number]  HPI    Problem List 1.  COPD   - Gold stage 2- s/p rehab compleetion Aug 2011. -No desaturation walking 185 feet x 3 laps in Oct 2011. Rx spiriva - New exertional hypoxema - May 2012; commenced on O2  - Non compliance with RX 2012 onwards but claimed conpliance aug 2013  -Worsened Gold stage 4 copd & class 3 dyspnea:  11/24/11:  Spirometry fev1 0.88L/36% and worst ever. Walk test: 185 feet x 3 laps: desaturated at first lap to 88%. Non compliant with rx) -   2. LUL Nodule - pseudotumor , 2-3cm, PET negative March 2011.   3. h of LLL necrotixing pna and dysphagia/peg tube - Summer 2010, Cleared March 201  4. Chronic PEG feed due to ? Achalasia - fu with Dr Collene Mares  5. Body mass index is 21.27 kg/(m^2).   - Body mass index is 21.03 kg/(m^2). - 12/29/2011  -Body mass index is 20.47 kg/(m^2). - 04/13/2012 - Body mass index is 21.53 kg/(m^2). on 10/10/2012      6. AECOPD hx  - July 2012 - opd RX  - March 2013 - opd RX - April 7 - 12, Admit for AECOPD and RLL PNA per dc summary though cxr had no pna    OV 04/13/2012 Followup for COPD. Presenst with wife. Dyspnea is class 3 and unchanged. They boith now maintain he is compliant with all meds. In addition, also off coreg. Still no improvement in dyspnea. Denies fever, cough, sputum, worsening edema. No interim problems.   REC Rehab  OV 10/10/2012  Routine followup for COPD. As usual presents with wife. Both maintain that he is compliant with his nebulizers. For the past 2-3 weeks he is noticing worsening dyspnea on exertion. He currently is unable to walk from the chair in the office from the door in the office from without getting short of breath. This is despite oxygen use. Dyspnea is rated as moderate and relieved by rest. Dyspnea is associated with no worsening cough but increase in her sputum and possible change in color in sputum. There is  no history of any wheezing, fever, orthopnea or worsening edema or hemoptysis. He denies any chest pain  REC Have nebulizer albuterol in office right now Have IM depot medrol 40mg  in office right now You have mild attack of copd called COPD exacerbation Please take doxycycline 100mg  twice daily after meals x 5 days; avoid sunlight Please take prednisone 40mg  once daily x 3 days, then 20mg  once daily x 3 days, then 10mg  once daily x 3 days, then 5mg  once dailyx 3 days and stop If not getting better in 24-48h call us REturn in 5 months or sooner if needed   OV 03/14/2013  FU visit. Wife and daughte presnt  Overal no change in copd cat score but he, wife and duaghter state that last few weeks to few months general decline in physiccal conditoning. Still able to do ADLs but there is lot of fatigue. ECOG is now 3-4. Appetite poor but maintaing weight. Last few days increased cough, with new yellow sputum and one episode ofstreay small hemoptysis. Worried he is becomingdependeont on O2 but feels this is helping him   Dg Chest 2 View  03/14/2013   *RADIOLOGY REPORT*  Clinical Data: Hemoptysis  CHEST - 2 VIEW  Comparison: 12/16/2011  Findings: Cardiomediastinal silhouette is stable.  Again noted hyperinflation and chronic interstitial prominence.  No segmental infiltrate or pulmonary edema.  Bony thorax is stable.  IMPRESSION: Again noted hyperinflation and chronic interstitial prominence.  No segmental infiltrate or pulmonary edema.  Bony thorax is stable.   Original Report Authenticated By: Lahoma Crocker, M.D.    Ok Edwards 08/27/2013  Chief Complaint  Patient presents with  . Follow-up    Pt c/o increased SOB with activity. Pt states cough and congestion are same.    FU visit  AT last visit in July 2014 Rx for AECOPD and ordered home PT due to sudden deconditoning. BUIt not sure why. Home PT nevere happened. Wife reports progressive deconditoning. STill able to walk and do activities but she feels home  PT would help. He is slow on his fee. He says 2 months of gradually worsening dyspnea and several months of mild left calf swelling (I do not appreciate it). Dednies chest pain, aspiration, hemoptysis, cough, pnd, orthopnea.    OV 01/25/2014  Chief Complaint  Patient presents with  . Follow-up    Pt c/o increase SOB with minimal activity and increase in cough with clear and yellow mucous. Deneis CP.   FU COPD and chronic resp failure. Presents with wife and daughter   - continues to complain of progressive dyspnea on exertion. Currently dyspnea is present for walking 10 feet and relieved by rest. According to family he does not take his COPD nebulizers or inhalers as prescribed. We ordered home physical therapy but he done them away after one visit. Apparently he spent this entire time in the day sedentary sitting in the chair and hardly ever moving out. He did see cardiology last week and apparently has been told that his heart is okay. He is not inclined to take more help with his dyspnea even though he complains about it. We have resolved that we will accept the status quo because he will not accept any intervention    Past, Family, Social reviewed: no change since last visit but wife was in ICU VDRF, LTAC, trach  Jan through April 2015. Now back home and baseline   CAT COPD Symptom & Quality of Life Score (GSK trademark) 0 is no burden. 5 is highest burden 10/10/2012  03/14/2013   Never Cough -> Cough all the time 2 0  No phlegm in chest -> Chest is full of phlegm 5 5  No chest tightness -> Chest feels very tight 0 0  No dyspnea for 1 flight stairs/hill -> Very dyspneic for 1 flight of stairs 5 5  No limitations for ADL at home -> Very limited with ADL at home 5 5  Confident leaving home -> Not at all confident leaving home 0 0  Sleep soundly -> Do not sleep soundly because of lung condition 0 0  Lots of Energy -> No energy at all 1 4  TOTAL Score (max 40)  18 19    Review of Systems    Constitutional: Negative for fever and unexpected weight change.  HENT: Negative for congestion, dental problem, ear pain, nosebleeds, postnasal drip, rhinorrhea, sinus pressure, sneezing, sore throat and trouble swallowing.   Eyes: Negative for redness and itching.  Respiratory: Positive for cough and shortness of breath. Negative for chest tightness and wheezing.   Cardiovascular: Positive for leg swelling. Negative for palpitations.  Gastrointestinal: Negative for nausea and vomiting.  Genitourinary: Negative for dysuria.  Musculoskeletal: Negative for joint swelling.  Skin: Negative for rash.  Neurological: Negative for headaches.  Hematological: Does not bruise/bleed easily.  Psychiatric/Behavioral: Negative for dysphoric mood. The patient is not nervous/anxious.        Objective:   Physical Exam   Filed Vitals:   01/25/14 1338  Height: 5\' 7"  (1.702 m)  Weight: 139 lb 3.2 oz (63.141 kg)   Filed Vitals:   01/25/14 1338  BP: 132/70  Pulse: 85  Height: 5\' 7"  (1.702 m)  Weight: 139 lb 3.2 oz (63.141 kg)  SpO2: 96%   On [facility]   Nursing note and vitals reviewed. Constitutional: He is oriented to person, place, and time. He appears well-developed and well-nourished. No distress.       Sitt with cup as before for his saliva due to his esophageal obstruction.   HENT:  Head: Normocephalic and atraumatic.  Right Ear: External ear normal.  Left Ear: External ear normal.  Mouth/Throat: Oropharynx is clear and moist. No oropharyngeal exudate.       Nasal cannula oxygen on  Eyes: Conjunctivae normal and EOM are normal. Pupils are equal, round, and reactive to light. Right eye exhibits no discharge. Left eye exhibits no discharge. No scleral icterus.  Neck: Normal range of motion. Neck supple. No JVD present. No tracheal deviation present. No thyromegaly present.  Cardiovascular: Normal rate, regular rhythm and intact distal pulses.  Exam reveals no gallop and no friction rub.    No murmur heard. Pulmonary/Chest: Effort normal and breath sounds normal. No respiratory distress. He has no wheezes. He has no rales. He exhibits no tenderness.  Abdominal: Soft. Bowel sounds are normal. He exhibits no distension and no mass. There is no tenderness. There is no rebound and no guarding.  Musculoskeletal: Normal range of motion. He exhibits no edema and no tenderness.  Lymphadenopathy:    He has no cervical adenopathy.  Neurological: He is alert and oriented to person, place, and time. He has normal reflexes. No cranial nerve deficit. Coordination normal.  Skin: Skin is warm and dry. No rash noted. He is not diaphoretic. No erythema. No pallor.  Psychiatric: He has a normal mood and affect. His behavior is normal. Judgment and thought content normal.          Assessment & Plan:

## 2014-01-25 NOTE — Patient Instructions (Addendum)
There is lot of physical deconditioning due to copd and aging going on Glad to hear that Dr Terrence Dupont thought heart was okay Respect your decision to maintain status quo (patient later called and said he would accept home health RN, order was sent)    Return in 8 months

## 2014-01-27 NOTE — Assessment & Plan Note (Signed)
There is lot of physical deconditioning due to copd and aging going on Glad to hear that Dr Terrence Dupont thought heart was okay Respect your decision to maintain status quo (patient later called and said he would accept home health RN, order was sent)    Return in 8 months (> 50% of this 15 min visit spent in face to face counseling)

## 2014-02-08 ENCOUNTER — Telehealth: Payer: Self-pay | Admitting: Internal Medicine

## 2014-02-11 NOTE — Telephone Encounter (Signed)
Called [facility] and spoke with Barnett Applebaum who reported that 6/5 phone note was just an FYI >> pt missed his appt for his routine nursing visit, but [facility] was able to see pt the following day.  Nothing is needed; pt was doing well.  Noted by triage; will sign off.

## 2014-03-14 ENCOUNTER — Telehealth: Payer: Self-pay | Admitting: Internal Medicine

## 2014-03-14 NOTE — Telephone Encounter (Signed)
Yes that is fine for  OT order

## 2014-03-14 NOTE — Telephone Encounter (Signed)
Please advise MR if okay to give VO? thanks 

## 2014-03-15 NOTE — Telephone Encounter (Signed)
I called spoke with Olivia Mackie. Gave VO. Nothing further needed

## 2014-03-19 ENCOUNTER — Telehealth: Payer: Self-pay | Admitting: Internal Medicine

## 2014-03-19 NOTE — Telephone Encounter (Signed)
i called alyssa and gave VO. Nothing further needed

## 2014-03-29 ENCOUNTER — Telehealth: Payer: Self-pay | Admitting: Internal Medicine

## 2014-03-29 NOTE — Telephone Encounter (Signed)
Called and spoke to Marissa with [facility]. She stated she would like to extend home health care for an additional 2 weeks d/t pt having SOB, MR aware of pt's SOB per last OV note. Verbalized OK to extend [facility]. Gardiner Rhyme that if pt's SOB increases to call or for pt to seek emergency care. Pamala Hurry verbalized understanding and denied any further questions or concerns at this time. Nothing further needed.

## 2014-04-01 ENCOUNTER — Telehealth: Payer: Self-pay | Admitting: Internal Medicine

## 2014-04-01 NOTE — Telephone Encounter (Signed)
I called gave [name] the VO to continue care. Nothing further needed

## 2014-05-08 ENCOUNTER — Other Ambulatory Visit (HOSPITAL_COMMUNITY): Payer: Self-pay | Admitting: Urology

## 2014-05-08 DIAGNOSIS — R972 Elevated prostate specific antigen [PSA]: Secondary | ICD-10-CM

## 2014-05-08 DIAGNOSIS — C61 Malignant neoplasm of prostate: Secondary | ICD-10-CM

## 2014-05-22 ENCOUNTER — Ambulatory Visit (HOSPITAL_COMMUNITY): Payer: Medicare Other

## 2014-05-22 ENCOUNTER — Encounter (HOSPITAL_COMMUNITY): Admission: RE | Admit: 2014-05-22 | Payer: Medicare Other | Source: Ambulatory Visit

## 2014-06-03 ENCOUNTER — Encounter (HOSPITAL_COMMUNITY)
Admission: RE | Admit: 2014-06-03 | Discharge: 2014-06-03 | Disposition: A | Discharge status: Home / Self Care | Payer: Medicare Other | Source: Ambulatory Visit | Attending: Urology | Admitting: Urology

## 2014-06-03 DIAGNOSIS — R972 Elevated prostate specific antigen [PSA]: Secondary | ICD-10-CM | POA: Insufficient documentation

## 2014-06-03 MED ORDER — TECHNETIUM TC 99M MEDRONATE IV KIT
25.1000 | PACK | Freq: Once | INTRAVENOUS | Status: AC | PRN
  Administered 2014-06-03: 25.1 via INTRAVENOUS

## 2014-07-08 ENCOUNTER — Ambulatory Visit (HOSPITAL_COMMUNITY)
Admission: RE | Admit: 2014-07-08 | Discharge: 2014-07-08 | Disposition: A | Discharge status: Home / Self Care | Payer: Medicare Other | Source: Ambulatory Visit | Attending: Interventional Radiology | Admitting: Interventional Radiology

## 2014-07-08 ENCOUNTER — Other Ambulatory Visit (HOSPITAL_COMMUNITY): Payer: Self-pay | Admitting: Interventional Radiology

## 2014-07-08 DIAGNOSIS — R6251 Failure to thrive (child): Secondary | ICD-10-CM

## 2014-07-08 DIAGNOSIS — K9423 Gastrostomy malfunction: Secondary | ICD-10-CM | POA: Diagnosis present

## 2014-07-08 DIAGNOSIS — R627 Adult failure to thrive: Secondary | ICD-10-CM | POA: Diagnosis not present

## 2014-07-08 MED ORDER — IOHEXOL 300 MG/ML  SOLN
50.0000 mL | Freq: Once | INTRAMUSCULAR | Status: AC | PRN
  Administered 2014-07-08: 20 mL via INTRAVENOUS

## 2014-09-22 ENCOUNTER — Emergency Department (HOSPITAL_COMMUNITY): Payer: Medicare Other

## 2014-09-22 ENCOUNTER — Inpatient Hospital Stay (HOSPITAL_COMMUNITY)
Admission: EM | Admit: 2014-09-22 | Discharge: 2014-09-26 | DRG: 682 | Disposition: A | Discharge status: Hospice | Payer: Medicare Other | Attending: Internal Medicine | Admitting: Internal Medicine

## 2014-09-22 ENCOUNTER — Encounter (HOSPITAL_COMMUNITY): Payer: Self-pay | Admitting: *Deleted

## 2014-09-22 DIAGNOSIS — N184 Chronic kidney disease, stage 4 (severe): Secondary | ICD-10-CM | POA: Diagnosis present

## 2014-09-22 DIAGNOSIS — I714 Abdominal aortic aneurysm, without rupture: Secondary | ICD-10-CM | POA: Diagnosis present

## 2014-09-22 DIAGNOSIS — Z9981 Dependence on supplemental oxygen: Secondary | ICD-10-CM | POA: Diagnosis not present

## 2014-09-22 DIAGNOSIS — I452 Bifascicular block: Secondary | ICD-10-CM | POA: Diagnosis present

## 2014-09-22 DIAGNOSIS — N179 Acute kidney failure, unspecified: Principal | ICD-10-CM | POA: Diagnosis present

## 2014-09-22 DIAGNOSIS — Z681 Body mass index (BMI) 19 or less, adult: Secondary | ICD-10-CM

## 2014-09-22 DIAGNOSIS — B372 Candidiasis of skin and nail: Secondary | ICD-10-CM | POA: Diagnosis present

## 2014-09-22 DIAGNOSIS — I10 Essential (primary) hypertension: Secondary | ICD-10-CM | POA: Diagnosis present

## 2014-09-22 DIAGNOSIS — M25552 Pain in left hip: Secondary | ICD-10-CM | POA: Diagnosis present

## 2014-09-22 DIAGNOSIS — E875 Hyperkalemia: Secondary | ICD-10-CM | POA: Diagnosis present

## 2014-09-22 DIAGNOSIS — Z955 Presence of coronary angioplasty implant and graft: Secondary | ICD-10-CM

## 2014-09-22 DIAGNOSIS — C7951 Secondary malignant neoplasm of bone: Secondary | ICD-10-CM | POA: Diagnosis present

## 2014-09-22 DIAGNOSIS — Z515 Encounter for palliative care: Secondary | ICD-10-CM | POA: Diagnosis not present

## 2014-09-22 DIAGNOSIS — N131 Hydronephrosis with ureteral stricture, not elsewhere classified: Secondary | ICD-10-CM | POA: Diagnosis present

## 2014-09-22 DIAGNOSIS — I2510 Atherosclerotic heart disease of native coronary artery without angina pectoris: Secondary | ICD-10-CM | POA: Diagnosis present

## 2014-09-22 DIAGNOSIS — Z66 Do not resuscitate: Secondary | ICD-10-CM | POA: Diagnosis present

## 2014-09-22 DIAGNOSIS — E43 Unspecified severe protein-calorie malnutrition: Secondary | ICD-10-CM | POA: Insufficient documentation

## 2014-09-22 DIAGNOSIS — E8809 Other disorders of plasma-protein metabolism, not elsewhere classified: Secondary | ICD-10-CM | POA: Diagnosis present

## 2014-09-22 DIAGNOSIS — F419 Anxiety disorder, unspecified: Secondary | ICD-10-CM | POA: Diagnosis present

## 2014-09-22 DIAGNOSIS — N1330 Unspecified hydronephrosis: Secondary | ICD-10-CM | POA: Diagnosis present

## 2014-09-22 DIAGNOSIS — Z931 Gastrostomy status: Secondary | ICD-10-CM | POA: Diagnosis not present

## 2014-09-22 DIAGNOSIS — R1310 Dysphagia, unspecified: Secondary | ICD-10-CM | POA: Diagnosis present

## 2014-09-22 DIAGNOSIS — Z87891 Personal history of nicotine dependence: Secondary | ICD-10-CM | POA: Diagnosis not present

## 2014-09-22 DIAGNOSIS — R109 Unspecified abdominal pain: Secondary | ICD-10-CM | POA: Diagnosis present

## 2014-09-22 DIAGNOSIS — C61 Malignant neoplasm of prostate: Secondary | ICD-10-CM | POA: Diagnosis present

## 2014-09-22 DIAGNOSIS — M549 Dorsalgia, unspecified: Secondary | ICD-10-CM | POA: Diagnosis not present

## 2014-09-22 DIAGNOSIS — I493 Ventricular premature depolarization: Secondary | ICD-10-CM | POA: Insufficient documentation

## 2014-09-22 DIAGNOSIS — I480 Paroxysmal atrial fibrillation: Secondary | ICD-10-CM | POA: Diagnosis present

## 2014-09-22 DIAGNOSIS — I472 Ventricular tachycardia: Secondary | ICD-10-CM | POA: Diagnosis present

## 2014-09-22 DIAGNOSIS — I4891 Unspecified atrial fibrillation: Secondary | ICD-10-CM | POA: Diagnosis present

## 2014-09-22 DIAGNOSIS — J449 Chronic obstructive pulmonary disease, unspecified: Secondary | ICD-10-CM | POA: Diagnosis present

## 2014-09-22 DIAGNOSIS — J9610 Chronic respiratory failure, unspecified whether with hypoxia or hypercapnia: Secondary | ICD-10-CM | POA: Diagnosis present

## 2014-09-22 DIAGNOSIS — E46 Unspecified protein-calorie malnutrition: Secondary | ICD-10-CM | POA: Diagnosis present

## 2014-09-22 DIAGNOSIS — K219 Gastro-esophageal reflux disease without esophagitis: Secondary | ICD-10-CM | POA: Diagnosis present

## 2014-09-22 DIAGNOSIS — R059 Cough, unspecified: Secondary | ICD-10-CM

## 2014-09-22 DIAGNOSIS — Z7982 Long term (current) use of aspirin: Secondary | ICD-10-CM | POA: Diagnosis not present

## 2014-09-22 DIAGNOSIS — I129 Hypertensive chronic kidney disease with stage 1 through stage 4 chronic kidney disease, or unspecified chronic kidney disease: Secondary | ICD-10-CM | POA: Diagnosis present

## 2014-09-22 DIAGNOSIS — I252 Old myocardial infarction: Secondary | ICD-10-CM | POA: Diagnosis not present

## 2014-09-22 DIAGNOSIS — I482 Chronic atrial fibrillation: Secondary | ICD-10-CM | POA: Diagnosis present

## 2014-09-22 DIAGNOSIS — B3789 Other sites of candidiasis: Secondary | ICD-10-CM | POA: Diagnosis present

## 2014-09-22 DIAGNOSIS — J189 Pneumonia, unspecified organism: Secondary | ICD-10-CM | POA: Diagnosis present

## 2014-09-22 DIAGNOSIS — R0602 Shortness of breath: Secondary | ICD-10-CM

## 2014-09-22 DIAGNOSIS — R05 Cough: Secondary | ICD-10-CM

## 2014-09-22 HISTORY — DX: Diverticulosis of intestine, part unspecified, without perforation or abscess without bleeding: K57.90

## 2014-09-22 HISTORY — DX: Gastro-esophageal reflux disease without esophagitis: K21.9

## 2014-09-22 HISTORY — DX: Chronic kidney disease, stage 4 (severe): N18.4

## 2014-09-22 HISTORY — DX: Malignant neoplasm of prostate: C61

## 2014-09-22 HISTORY — DX: Paroxysmal atrial fibrillation: I48.0

## 2014-09-22 LAB — COMPREHENSIVE METABOLIC PANEL
ALBUMIN: 2.8 g/dL — AB (ref 3.5–5.2)
ALT: 32 U/L (ref 0–53)
AST: 32 U/L (ref 0–37)
Alkaline Phosphatase: 107 U/L (ref 39–117)
Anion gap: 11 (ref 5–15)
BUN: 55 mg/dL — ABNORMAL HIGH (ref 6–23)
CO2: 24 mmol/L (ref 19–32)
CREATININE: 2.5 mg/dL — AB (ref 0.50–1.35)
Calcium: 8.7 mg/dL (ref 8.4–10.5)
Chloride: 104 mEq/L (ref 96–112)
GFR calc non Af Amer: 21 mL/min — ABNORMAL LOW (ref >90)
GFR, EST AFRICAN AMERICAN: 24 mL/min — AB (ref >90)
Glucose, Bld: 94 mg/dL (ref 70–99)
Potassium: 5.8 mmol/L — ABNORMAL HIGH (ref 3.5–5.1)
Sodium: 139 mmol/L (ref 135–145)
TOTAL PROTEIN: 7.2 g/dL (ref 6.0–8.3)
Total Bilirubin: 0.6 mg/dL (ref 0.3–1.2)

## 2014-09-22 LAB — CBC WITH DIFFERENTIAL/PLATELET
BASOS PCT: 0 % (ref 0–1)
Basophils Absolute: 0 10*3/uL (ref 0.0–0.1)
EOS ABS: 0.1 10*3/uL (ref 0.0–0.7)
Eosinophils Relative: 1 % (ref 0–5)
HEMATOCRIT: 33.2 % — AB (ref 39.0–52.0)
Hemoglobin: 10.4 g/dL — ABNORMAL LOW (ref 13.0–17.0)
Lymphocytes Relative: 3 % — ABNORMAL LOW (ref 12–46)
Lymphs Abs: 0.3 10*3/uL — ABNORMAL LOW (ref 0.7–4.0)
MCH: 28.3 pg (ref 26.0–34.0)
MCHC: 31.3 g/dL (ref 30.0–36.0)
MCV: 90.5 fL (ref 78.0–100.0)
MONO ABS: 0.9 10*3/uL (ref 0.1–1.0)
MONOS PCT: 11 % (ref 3–12)
NEUTROS ABS: 7.1 10*3/uL (ref 1.7–7.7)
Neutrophils Relative %: 85 % — ABNORMAL HIGH (ref 43–77)
PLATELETS: 262 10*3/uL (ref 150–400)
RBC: 3.67 MIL/uL — ABNORMAL LOW (ref 4.22–5.81)
RDW: 14.7 % (ref 11.5–15.5)
WBC: 8.4 10*3/uL (ref 4.0–10.5)

## 2014-09-22 LAB — URINALYSIS, ROUTINE W REFLEX MICROSCOPIC
BILIRUBIN URINE: NEGATIVE
Glucose, UA: NEGATIVE mg/dL
KETONES UR: NEGATIVE mg/dL
Leukocytes, UA: NEGATIVE
NITRITE: NEGATIVE
PROTEIN: NEGATIVE mg/dL
SPECIFIC GRAVITY, URINE: 1.016 (ref 1.005–1.030)
Urobilinogen, UA: 1 mg/dL (ref 0.0–1.0)
pH: 6.5 (ref 5.0–8.0)

## 2014-09-22 LAB — URINE MICROSCOPIC-ADD ON

## 2014-09-22 LAB — LACTIC ACID, PLASMA: Lactic Acid, Venous: 1.2 mmol/L (ref 0.5–2.2)

## 2014-09-22 MED ORDER — NEPRO/CARBSTEADY PO LIQD
237.0000 mL | ORAL | Status: DC
  Administered 2014-09-22 – 2014-09-24 (×6): 237 mL via ORAL
  Filled 2014-09-22: qty 237

## 2014-09-22 MED ORDER — IOHEXOL 300 MG/ML  SOLN
100.0000 mL | Freq: Once | INTRAMUSCULAR | Status: AC | PRN
  Administered 2014-09-22: 80 mL via INTRAVENOUS

## 2014-09-22 MED ORDER — IOHEXOL 300 MG/ML  SOLN
25.0000 mL | INTRAMUSCULAR | Status: AC
  Administered 2014-09-22: 25 mL via ORAL

## 2014-09-22 MED ORDER — NEPRO/CARBSTEADY PO LIQD: 237.0000 mL | ORAL | Status: DC

## 2014-09-22 MED ORDER — SODIUM POLYSTYRENE SULFONATE 15 GM/60ML PO SUSP
15.0000 g | Freq: Once | ORAL | Status: AC
  Administered 2014-09-22: 15 g via ORAL
  Filled 2014-09-22: qty 60

## 2014-09-22 NOTE — ED Notes (Signed)
Patient transported to X-ray 

## 2014-09-22 NOTE — ED Notes (Signed)
Pt now denies abd pain. States that he is experiencing left hip pain.

## 2014-09-22 NOTE — ED Provider Notes (Signed)
CSN: [id number]     Arrival date & time 09/22/14  1832 History   First MD Initiated Contact with Patient 09/22/14 1850     Chief Complaint  Patient presents with  . Abdominal Pain     (Consider location/radiation/quality/duration/timing/severity/associated sxs/prior Treatment) HPI 81 year old male past history as below notable for Afib, COPD, CAD, AAA, esophageal stricture, prostate cancer who presents to ED with the family who state patient has been "slower than normal" over the last week and a half. They also report that today pt began complaining of his lower left abdomen hurting as well as left hip pain. There is no report of recent fall or other injury. Patient has not had any nausea, vomiting, diarrhea, fever, recent illness. Patient typically is able to ambulate with use of a cane, however patient has had difficulty with that today. When asking patient specifically what is wrong with him, he states nothing. When asking about his abdominal pain he states that it does hurt, however. Patient is unable to describe abdominal pain further. Pt cannot make his own decisions and that is left to wife and daughter. Pt is full code per family. He has known prostate cancer with obstruction to kidneys which has been deemed nonoperative which they have been monitoring. Pt has had no issues thus far. No other complaints this time. Past Medical History  Diagnosis Date  . Necrotizing pneumonia 03-20-2009  . Nodule of left lung   . Nodule of right lung     resolved  . HTN (hypertension)   . Esophageal stricture   . Dysphagia   . Hiccups   . Chronic atrial fibrillation   . Anemia   . COPD (chronic obstructive pulmonary disease)   . Coronary heart disease   . Hiatal hernia   . Heart attack 09-2007  . Abdominal aneurysm    Past Surgical History  Procedure Laterality Date  . Eye surgery  2003    right eye macular hole repair  . Coronary stent placement      x2   Family History  Problem Relation  Age of Onset  . Breast cancer Sister   . Cancer Sister   . Cancer Brother   . Cancer Brother   . Cancer Brother    History  Substance Use Topics  . Smoking status: Former Smoker -- 1.00 packs/day for 30 years    Types: Cigarettes    Quit date: 09/06/1968  . Smokeless tobacco: Never Used  . Alcohol Use: No     Comment: hx of ETOH abuse, quit x 34yrs ago    Review of Systems  Constitutional: Positive for activity change and fatigue. Negative for fever and appetite change.  HENT: Negative for congestion, rhinorrhea and sore throat.   Eyes: Negative for visual disturbance.  Respiratory: Negative for cough and shortness of breath.   Cardiovascular: Negative for chest pain, palpitations and leg swelling.  Gastrointestinal: Positive for abdominal pain. Negative for nausea, vomiting and diarrhea.  Genitourinary: Negative for dysuria, flank pain, decreased urine volume and difficulty urinating.  Musculoskeletal: Positive for arthralgias and gait problem. Negative for back pain and neck pain.  Skin: Negative for rash.  Neurological: Negative for dizziness, syncope, speech difficulty, weakness, light-headedness, numbness and headaches.  Psychiatric/Behavioral: Negative for confusion.     Allergies  Review of patient's allergies indicates no known allergies.  Home Medications   Prior to Admission medications   Medication Sig Start Date End Date Taking? Authorizing Provider  amLODipine (NORVASC) 5 MG tablet Place  1 tablet (5 mg total) into feeding tube daily. 12/17/11 08/27/14  Clent Demark, MD  aspirin 81 MG tablet 81 mg by PEG Tube route daily.    Historical Provider, MD  budesonide (PULMICORT) 0.5 MG/2ML nebulizer solution Take 0.5 mg by nebulization 2 (two) times daily. For shortness of breath    Historical Provider, MD  esomeprazole (NEXIUM) 40 MG capsule 40 mg by PEG Tube route daily before breakfast.      Historical Provider, MD  ferrous sulfate (SLOW FE) 160 (50 FE) MG TBCR 1  tablet by PEG Tube route 2 (two) times daily.     Historical Provider, MD  losartan (COZAAR) 50 MG tablet 50 mg by PEG Tube route 2 (two) times daily.    Historical Provider, MD  Nutritional Supplements (JEVITY PLUS) LIQD 300 mLs by PEG Tube route 3 (three) times daily. 376ml at 7am, 12pm, 5pm. Each time tube flushes 133ml past tube feeds    Historical Provider, MD  silodosin (RAPAFLO) 8 MG CAPS capsule 8 mg by PEG Tube route daily with breakfast.     Historical Provider, MD   BP 117/67 mmHg  Pulse 53  Resp 18  SpO2 100% Physical Exam  Constitutional: He appears well-developed and well-nourished. No distress.  HENT:  Head: Normocephalic and atraumatic.  Nose: Nose normal.  Mouth/Throat: Oropharynx is clear and moist. No oropharyngeal exudate.  Eyes: Conjunctivae and EOM are normal.  Neck: Normal range of motion. Neck supple. No JVD present.  Cardiovascular: Normal rate, regular rhythm, normal heart sounds and intact distal pulses.   Pulmonary/Chest: Effort normal and breath sounds normal. No respiratory distress.  Abdominal: Soft. He exhibits no distension, no pulsatile midline mass and no mass. There is tenderness (diffuse L>R). There is no rebound and no guarding. No hernia.  Musculoskeletal: Normal range of motion.  Neurological: He is alert. No cranial nerve deficit.  Skin: Skin is warm and dry. No rash noted.  Psychiatric: He has a normal mood and affect.  Nursing note and vitals reviewed.   ED Course  Procedures (including critical care time) Labs Review Labs Reviewed  CBC WITH DIFFERENTIAL - Abnormal; Notable for the following:    RBC 3.67 (*)    Hemoglobin 10.4 (*)    HCT 33.2 (*)    Neutrophils Relative % 85 (*)    Lymphocytes Relative 3 (*)    Lymphs Abs 0.3 (*)    All other components within normal limits  COMPREHENSIVE METABOLIC PANEL - Abnormal; Notable for the following:    Potassium 5.8 (*)    BUN 55 (*)    Creatinine, Ser 2.50 (*)    Albumin 2.8 (*)    GFR  calc non Af Amer 21 (*)    GFR calc Af Amer 24 (*)    All other components within normal limits  URINALYSIS, ROUTINE W REFLEX MICROSCOPIC - Abnormal; Notable for the following:    Hgb urine dipstick MODERATE (*)    All other components within normal limits  URINE MICROSCOPIC-ADD ON - Abnormal; Notable for the following:    Squamous Epithelial / LPF FEW (*)    Bacteria, UA FEW (*)    All other components within normal limits  LACTIC ACID, PLASMA  BASIC METABOLIC PANEL  CBC    Imaging Review Dg Chest 2 View  09/22/2014   CLINICAL DATA:  Generalized body aches beginning this morning. Shortness of breath, cough for several days. History of hypertension and COPD.  EXAM: CHEST  2 VIEW  COMPARISON:  03/14/2013  FINDINGS: There is hyperinflation of the lungs compatible with COPD. Heart is borderline in size. Rounded opacity noted at the right lung base, not well visualized on the lateral view. Left lung is clear. No effusions. No acute bony abnormality.  IMPRESSION: COPD.  Rounded opacity at the right lung base. Cannot exclude developing pneumonia.   Electronically Signed   By: Rolm Baptise M.D.   On: 09/22/2014 21:09   Ct Abdomen Pelvis W Contrast  09/22/2014   CLINICAL DATA:  Abdominal pain, site unspecified.  EXAM: CT ABDOMEN AND PELVIS WITH CONTRAST  TECHNIQUE: Multidetector CT imaging of the abdomen and pelvis was performed using the standard protocol following bolus administration of intravenous contrast.  CONTRAST:  1 OMNIPAQUE IOHEXOL 300 MG/ML SOLN, 78mL OMNIPAQUE IOHEXOL 300 MG/ML SOLN  COMPARISON:  PET-CT 11/07/2009  FINDINGS: BODY WALL: Unremarkable.  LOWER CHEST: Advanced emphysema in the lower lungs.  ABDOMEN/PELVIS:  Liver: Scattered, circumscribed low-density lesions are most compatible with cysts. No enhancing lesions suspected.  Biliary: No evidence of biliary obstruction or stone.  Pancreas: Unremarkable.  Spleen: Unremarkable.  Adrenals: Unremarkable.  Kidneys and ureters: Bilateral  hydroureteronephrosis to the level of the bilateral lower ureters where there is abrupt narrowing of the ureters by enhancing tissue that is contiguous with the prostate.  Bladder: Moderately distended. Lobulated tissue along the posterior wall is likely prostatic.  Reproductive: There is abnormal lobulated enlargement of the prostate gland with extracapsular soft tissue infiltrating the rectoprostatic angle. Enhancing material is contiguous in the lower ureters bilaterally. Bilateral pelvic and periaortic lymphadenopathy with nodes measuring up to 16 mm short axis below the left renal hilum (image 33).  Bowel: No obstruction. Negative appendix. Percutaneous gastrostomy tube which is in good position.  Retroperitoneum: No mass or adenopathy.  Peritoneum: No ascites or pneumoperitoneum.  Vascular: Extensive atherosclerosis. The patient is status post aortobiiliac grafting. Graft is patent. Left pelvic adenopathy narrows the left external iliac vein, without visible thrombosis. Notable plaque present in the proximal SMA.  OSSEOUS: New sclerotic lesions throughout the bony pelvis and spine. No pathologic fracture or definite extra osseous extension.  IMPRESSION: Findings of prostate cancer with osseous and nodal metastases. There is bilateral hydroureteronephrosis from tumoral obstruction of the distal ureters.   Electronically Signed   By: Jorje Guild M.D.   On: 09/22/2014 21:50     EKG Interpretation None      MDM   Final diagnoses:  Left hip pain    H&P as above. HDS, NAD. Abdominal exam notable for diffuse tenderness to palpation worse in the left lower quadrant. Patient received screening labs, CXR to r/o LLL PNA (has mild cough) and CT of his abdomen for further evaluation. Workup was notable for AKI with creatinine of 2.5. Patient has mild hyperkalemia at 5.8. EKG shows no peaked T waves or other abnormal changes. CXR shows opacity in R base. Given no fever and only mild cough, will not tx at  this time. CT notable for osseous and nodal metastasis from prostate cancer with bilateral hydroureteronephrosis from tumor obstruction of distal ureters. This is a known finding for this patient but now he is with new AKI and he will require admission for further management including establishment of palliative medicine. Discussed plan with family and they were agreeable. Patient will be admitted to hospitalist.  Pt seen in conjunction with Dr. Venancio Poisson, [facility] Resident - PGY-2      Kirstie Peri, MD 09/23/14 [phone]  Ephraim Hamburger, MD 09/26/14 [phone]

## 2014-09-22 NOTE — ED Notes (Signed)
Lab called in regards to lab draws. Aware that pt is waiting on iv team, Stated they would send someone to draw blood

## 2014-09-22 NOTE — ED Notes (Signed)
Pt arrives from home via EMS. Family called out stating that pt is experiencing RLQ pain that radiates to his rt flank. Pt reported some relief after he had a BM. Pt wears 2L [place] chronically.

## 2014-09-22 NOTE — ED Notes (Signed)
Ct advised pt has had oral contrast through peg tube.

## 2014-09-22 NOTE — Progress Notes (Signed)
Attempted to call report. No answer. Number left with Network engineer.

## 2014-09-23 ENCOUNTER — Inpatient Hospital Stay (HOSPITAL_COMMUNITY): Payer: Medicare Other

## 2014-09-23 ENCOUNTER — Encounter (HOSPITAL_COMMUNITY): Payer: Self-pay | Admitting: Internal Medicine

## 2014-09-23 DIAGNOSIS — E46 Unspecified protein-calorie malnutrition: Secondary | ICD-10-CM | POA: Diagnosis present

## 2014-09-23 DIAGNOSIS — R109 Unspecified abdominal pain: Secondary | ICD-10-CM | POA: Diagnosis present

## 2014-09-23 DIAGNOSIS — E8809 Other disorders of plasma-protein metabolism, not elsewhere classified: Secondary | ICD-10-CM | POA: Diagnosis present

## 2014-09-23 DIAGNOSIS — N1330 Unspecified hydronephrosis: Secondary | ICD-10-CM | POA: Diagnosis present

## 2014-09-23 DIAGNOSIS — C61 Malignant neoplasm of prostate: Secondary | ICD-10-CM

## 2014-09-23 DIAGNOSIS — E875 Hyperkalemia: Secondary | ICD-10-CM

## 2014-09-23 DIAGNOSIS — R1031 Right lower quadrant pain: Secondary | ICD-10-CM

## 2014-09-23 DIAGNOSIS — B3789 Other sites of candidiasis: Secondary | ICD-10-CM | POA: Diagnosis present

## 2014-09-23 DIAGNOSIS — M25552 Pain in left hip: Secondary | ICD-10-CM | POA: Diagnosis present

## 2014-09-23 DIAGNOSIS — J189 Pneumonia, unspecified organism: Secondary | ICD-10-CM | POA: Diagnosis present

## 2014-09-23 DIAGNOSIS — E43 Unspecified severe protein-calorie malnutrition: Secondary | ICD-10-CM

## 2014-09-23 DIAGNOSIS — J449 Chronic obstructive pulmonary disease, unspecified: Secondary | ICD-10-CM

## 2014-09-23 DIAGNOSIS — I2510 Atherosclerotic heart disease of native coronary artery without angina pectoris: Secondary | ICD-10-CM

## 2014-09-23 LAB — CBC
HCT: 29 % — ABNORMAL LOW (ref 39.0–52.0)
Hemoglobin: 9.1 g/dL — ABNORMAL LOW (ref 13.0–17.0)
MCH: 28.4 pg (ref 26.0–34.0)
MCHC: 31.4 g/dL (ref 30.0–36.0)
MCV: 90.6 fL (ref 78.0–100.0)
Platelets: 255 10*3/uL (ref 150–400)
RBC: 3.2 MIL/uL — AB (ref 4.22–5.81)
RDW: 14.8 % (ref 11.5–15.5)
WBC: 6.1 10*3/uL (ref 4.0–10.5)

## 2014-09-23 LAB — BASIC METABOLIC PANEL WITH GFR
Anion gap: 10 (ref 5–15)
BUN: 48 mg/dL — ABNORMAL HIGH (ref 6–23)
CO2: 23 mmol/L (ref 19–32)
Calcium: 8.2 mg/dL — ABNORMAL LOW (ref 8.4–10.5)
Chloride: 102 meq/L (ref 96–112)
Creatinine, Ser: 2.29 mg/dL — ABNORMAL HIGH (ref 0.50–1.35)
GFR calc Af Amer: 27 mL/min — ABNORMAL LOW (ref >90)
GFR calc non Af Amer: 23 mL/min — ABNORMAL LOW (ref >90)
Glucose, Bld: 103 mg/dL — ABNORMAL HIGH (ref 70–99)
Potassium: 5.1 mmol/L (ref 3.5–5.1)
Sodium: 135 mmol/L (ref 135–145)

## 2014-09-23 LAB — GLUCOSE, CAPILLARY: Glucose-Capillary: 88 mg/dL (ref 70–99)

## 2014-09-23 MED ORDER — IPRATROPIUM-ALBUTEROL 0.5-2.5 (3) MG/3ML IN SOLN
3.0000 mL | RESPIRATORY_TRACT | Status: DC
  Administered 2014-09-23 – 2014-09-24 (×11): 3 mL via RESPIRATORY_TRACT
  Filled 2014-09-23 (×12): qty 3

## 2014-09-23 MED ORDER — HYDROMORPHONE HCL 1 MG/ML IJ SOLN
0.5000 mg | INTRAMUSCULAR | Status: DC | PRN
  Administered 2014-09-24 – 2014-09-26 (×4): 1 mg via INTRAVENOUS
  Filled 2014-09-23 (×5): qty 1

## 2014-09-23 MED ORDER — ONDANSETRON HCL 4 MG/2ML IJ SOLN: 4.0000 mg | Freq: Four times a day (QID) | INTRAMUSCULAR | Status: DC | PRN

## 2014-09-23 MED ORDER — FLUCONAZOLE 100MG IVPB
100.0000 mg | Freq: Once | INTRAVENOUS | Status: DC
  Filled 2014-09-23: qty 50

## 2014-09-23 MED ORDER — AMLODIPINE BESYLATE 5 MG PO TABS
5.0000 mg | ORAL_TABLET | Freq: Every day | ORAL | Status: DC
  Administered 2014-09-23 – 2014-09-26 (×4): 5 mg
  Filled 2014-09-23 (×4): qty 1

## 2014-09-23 MED ORDER — ACETAMINOPHEN 650 MG RE SUPP: 650.0000 mg | Freq: Four times a day (QID) | RECTAL | Status: DC | PRN

## 2014-09-23 MED ORDER — TAMSULOSIN HCL 0.4 MG PO CAPS
0.4000 mg | ORAL_CAPSULE | Freq: Every day | ORAL | Status: DC
  Administered 2014-09-23 – 2014-09-26 (×3): 0.4 mg via ORAL
  Filled 2014-09-23 (×5): qty 1

## 2014-09-23 MED ORDER — SODIUM CHLORIDE 0.9 % IV SOLN
INTRAVENOUS | Status: DC
  Administered 2014-09-23 – 2014-09-24 (×3): via INTRAVENOUS

## 2014-09-23 MED ORDER — HEPARIN SODIUM (PORCINE) 5000 UNIT/ML IJ SOLN
5000.0000 [IU] | Freq: Three times a day (TID) | INTRAMUSCULAR | Status: DC
  Administered 2014-09-23 – 2014-09-26 (×10): 5000 [IU] via SUBCUTANEOUS
  Filled 2014-09-23 (×14): qty 1

## 2014-09-23 MED ORDER — PANTOPRAZOLE SODIUM 40 MG PO TBEC: 40.0000 mg | DELAYED_RELEASE_TABLET | Freq: Every day | ORAL | Status: DC

## 2014-09-23 MED ORDER — FERROUS SULFATE 325 (65 FE) MG PO TABS
325.0000 mg | ORAL_TABLET | Freq: Every day | ORAL | Status: DC
  Filled 2014-09-23 (×3): qty 1

## 2014-09-23 MED ORDER — SODIUM CHLORIDE 0.9 % IJ SOLN
3.0000 mL | Freq: Two times a day (BID) | INTRAMUSCULAR | Status: DC
  Administered 2014-09-24 – 2014-09-26 (×3): 3 mL via INTRAVENOUS

## 2014-09-23 MED ORDER — FLUCONAZOLE 100MG IVPB
50.0000 mg | INTRAVENOUS | Status: DC
  Filled 2014-09-23: qty 25

## 2014-09-23 MED ORDER — DEXTROSE 5 % IV SOLN
500.0000 mg | INTRAVENOUS | Status: AC
  Administered 2014-09-23 – 2014-09-25 (×3): 500 mg via INTRAVENOUS
  Filled 2014-09-23 (×4): qty 500

## 2014-09-23 MED ORDER — OXYCODONE HCL 5 MG PO TABS
5.0000 mg | ORAL_TABLET | ORAL | Status: DC | PRN
  Administered 2014-09-23 (×3): 5 mg via ORAL
  Filled 2014-09-23 (×3): qty 1

## 2014-09-23 MED ORDER — ONDANSETRON HCL 4 MG PO TABS: 4.0000 mg | ORAL_TABLET | Freq: Four times a day (QID) | ORAL | Status: DC | PRN

## 2014-09-23 MED ORDER — ACETAMINOPHEN 325 MG PO TABS: 650.0000 mg | ORAL_TABLET | Freq: Four times a day (QID) | ORAL | Status: DC | PRN

## 2014-09-23 MED ORDER — ASPIRIN 81 MG PO CHEW
81.0000 mg | CHEWABLE_TABLET | Freq: Every day | ORAL | Status: DC
  Administered 2014-09-23: 81 mg via ORAL
  Filled 2014-09-23 (×2): qty 1

## 2014-09-23 MED ORDER — BUDESONIDE 0.5 MG/2ML IN SUSP
0.5000 mg | Freq: Two times a day (BID) | RESPIRATORY_TRACT | Status: DC
  Administered 2014-09-23 – 2014-09-26 (×7): 0.5 mg via RESPIRATORY_TRACT
  Filled 2014-09-23 (×9): qty 2

## 2014-09-23 NOTE — Consult Note (Addendum)
WOC wound consult note Reason for Consult: Groin candidiasis Wound type: Moisture Associated Skin Damage/Fungal rash Pressure Ulcer POA: N/A Measurement: Wounds are scattered, small in size Wound bed: Red Drainage (amount, consistency, odor) No drainage or odor noted at this time Periwound: Reddened, tender Dressing procedure/placement/frequency: Moisture associated skin damage/fungal rash noted to groin bilaterally, inner thighs bilaterally, and on scrotum.  Area is dry now, condom catheter is in place.  Area is very tender to touch.  Microguard powder applied to area.  Recommend applying microguard power as needed to area to maintain dryness.  Patient was educated on microguard and it's application, as well as the importance of keeping area dry to promote healing.  Pt nodded his head in understanding.  Microguard left at bedside.  Orson Gear, RN-BSN, Graduate Student Please re-consult if further assistance is needed.  Thank-you,  Julien Girt MSN, [place], [place], [place], [place]

## 2014-09-23 NOTE — Progress Notes (Signed)
Palliative consult received and chart reviewed. This gentleman has an extensive history with chronic disease progression and complications from necrotizing Pna including prior LTACH, trach, chronic PEG, with prolonged rehab back in 2010. He is at his baseline at home per the notes, sedentary but uses a cane-needs substantial assistance from family. Admitted with weakness, pain and found to have wide spread prostate cancer metastasis. Will schedule time to meet with family at the earliest possible time we have a provider available to discuss goals of care. Full consult to follow.  Lane Hacker, DO Palliative Medicine [phone]

## 2014-09-23 NOTE — H&P (Signed)
[facility] Admission History and Physical       NICKI GRACY LKT:[id number] DOB: 1919/05/01 DOA: 09/22/2014  Referring physician: EDP PCP: Patricia Nettle, MD  Specialists:   Chief Complaint: Severe ABD and Flank Pain  HPI: [name] is a 81 y.o. male with Untreated Prostate Cancer (07/2013), O2 dependent COPD, Hx of Necrotizing Pneumonia, Atrial Fibrillation CAD, and HTN who presents to the ED with complaints of severe RLQ ABD pain and Flank Pain since the afternoon.   He has had increased weakness over the past week, and has remained in the bed for the past few days.   He denies andy fevers or chills or chest pain. A CT scan of the ABD and Pelvis was performed and revealed an increased Prostate Cancer tumor burden with metastasis to the spine and bony pelvis with bilateral Hydronephrosis.   His labs also reveal an elevated BUN/Cr of 55/2.50 and Hyperkalemia at 5.8.  He was referred for medical admission.       Review of Systems:  Constitutional: +Weight Loss, No Weight Gain, Night Sweats, Fevers, Chills, Dizziness, Fatigue, or Generalized Weakness HEENT: No Headaches, Difficulty Swallowing,Tooth/Dental Problems,Sore Throat,  No Sneezing, Rhinitis, Ear Ache, Nasal Congestion, or Post Nasal Drip,  Cardio-vascular:  No Chest pain, Orthopnea, PND, Edema in Lower Extremities, Anasarca, Dizziness, Palpitations  Resp: No Dyspnea, No DOE, No Productive Cough, No Non-Productive Cough, No Hemoptysis, No Wheezing.    GI: No Heartburn, Indigestion, +Abdominal Pain, Nausea, Vomiting, Diarrhea, Hematemesis, Hematochezia, Melena, Change in Bowel Habits,  Loss of Appetite  GU: No Dysuria, Change in Color of Urine, No Urgency or Frequency, +Flank pain.  Musculoskeletal: No Joint Pain or Swelling, No Decreased Range of Motion, No Back Pain.  Neurologic: No Syncope, No Seizures, Muscle Weakness, Paresthesia, Vision Disturbance or Loss, No Diplopia, No Vertigo, +Difficulty Walking,  Skin:  No Rash or Lesions. Psych: No Change in Mood or Affect, No Depression or Anxiety, No Memory loss, No Confusion, or Hallucinations   Past Medical History  Diagnosis Date  . Necrotizing pneumonia 03-20-2009  . Nodule of left lung   . Nodule of right lung     resolved  . HTN (hypertension)   . Esophageal stricture   . Dysphagia   . Hiccups   . Chronic atrial fibrillation   . Anemia   . COPD (chronic obstructive pulmonary disease)   . Coronary heart disease   . Hiatal hernia   . Heart attack 09-2007  . Abdominal aneurysm       Past Surgical History  Procedure Laterality Date  . Eye surgery  2003    right eye macular hole repair  . Coronary stent placement      x2       Prior to Admission medications   Medication Sig Start Date End Date Taking? Authorizing Provider  amLODipine (NORVASC) 5 MG tablet Place 1 tablet (5 mg total) into feeding tube daily. 12/17/11 09/22/14 Yes Clent Demark, MD  aspirin 81 MG tablet 81 mg by PEG Tube route daily.   Yes Historical Provider, MD  budesonide (PULMICORT) 0.5 MG/2ML nebulizer solution Take 0.5 mg by nebulization 2 (two) times daily. For shortness of breath   Yes Historical Provider, MD  esomeprazole (NEXIUM) 40 MG capsule 40 mg by PEG Tube route daily before breakfast.     Yes Historical Provider, MD  ferrous sulfate (SLOW FE) 160 (50 FE) MG TBCR 1 tablet by PEG Tube route 2 (two) times daily.    Yes  Historical Provider, MD  losartan (COZAAR) 50 MG tablet 50 mg by PEG Tube route 2 (two) times daily.   Yes Historical Provider, MD  Nutritional Supplements (JEVITY PLUS) LIQD 300 mLs by PEG Tube route 3 (three) times daily. 352ml at 7am, 12pm, 5pm. Each time tube flushes 118ml past tube feeds   Yes Historical Provider, MD  silodosin (RAPAFLO) 8 MG CAPS capsule 8 mg by PEG Tube route daily with breakfast.    Yes Historical Provider, MD      No Known Allergies   Social History:  reports that he quit smoking about 46 years ago. His smoking  use included Cigarettes. He has a 30 pack-year smoking history. He has never used smokeless tobacco. He reports that he does not drink alcohol or use illicit drugs.     Family History  Problem Relation Age of Onset  . Breast cancer Sister   . Cancer Sister   . Cancer Brother   . Cancer Brother   . Cancer Brother        Physical Exam:  GEN:  Pleasant Elderly Cachectic  81 y.o. African American male examined  and in no acute distress; cooperative with exam Filed Vitals:   09/22/14 2225 09/22/14 2230 09/22/14 2245 09/23/14 0029  BP: 156/70 143/75 145/78 144/68  Pulse: 74 80 78 91  Temp:    98 F (36.7 C)  TempSrc:    Oral  Resp: 14 11 15 22   Height:    5' 10.8" (1.798 m)  Weight:    61.009 kg (134 lb 8 oz)  SpO2: 98% 100% 100% 100%   Blood pressure 144/68, pulse 91, temperature 98 F (36.7 C), temperature source Oral, resp. rate 22, height 5' 10.8" (1.798 m), weight 61.009 kg (134 lb 8 oz), SpO2 100 %. PSYCH: He is alert and oriented x4; does not appear anxious does not appear depressed; affect is normal HEENT: Normocephalic and Atraumatic, Mucous membranes pink; PERRLA; EOM intact; Fundi:  Benign;  No scleral icterus, Nares: Patent, Oropharynx: Clear, Edentulous,    Neck:  FROM, No Cervical Lymphadenopathy nor Thyromegaly or Carotid Bruit; No JVD; Breasts:: Not examined CHEST WALL: No tenderness CHEST: Normal respiration, clear to auscultation bilaterally HEART: Irregularly Irregular Rhythm; no murmurs rubs or gallops BACK: No kyphosis or scoliosis; No CVA tenderness ABDOMEN: Positive Bowel Sounds, Scaphoid, + PEG Tube Present,  Soft Non-Tender; No Masses, No Organomegaly. Rectal Exam: Not done EXTREMITIES: No Cyanosis, Clubbing, or Edema; No Ulcerations. Genitalia: not examined PULSES: 2+ and symmetric SKIN: Normal hydration no rash or ulceration CNS:  Alert and Oriented x 4, Generalized Weakness, No Focal Deficits Vascular: pulses palpable throughout    Labs on  Admission:  Basic Metabolic Panel:  Recent Labs Lab 09/22/14 2020  NA 139  K 5.8*  CL 104  CO2 24  GLUCOSE 94  BUN 55*  CREATININE 2.50*  CALCIUM 8.7   Liver Function Tests:  Recent Labs Lab 09/22/14 2020  AST 32  ALT 32  ALKPHOS 107  BILITOT 0.6  PROT 7.2  ALBUMIN 2.8*   No results for input(s): LIPASE, AMYLASE in the last 168 hours. No results for input(s): AMMONIA in the last 168 hours. CBC:  Recent Labs Lab 09/22/14 2020  WBC 8.4  NEUTROABS 7.1  HGB 10.4*  HCT 33.2*  MCV 90.5  PLT 262   Cardiac Enzymes: No results for input(s): CKTOTAL, CKMB, CKMBINDEX, TROPONINI in the last 168 hours.  BNP (last 3 results) No results for input(s): PROBNP in the  last 8760 hours. CBG: No results for input(s): GLUCAP in the last 168 hours.  Radiological Exams on Admission: Dg Chest 2 View  09/22/2014   CLINICAL DATA:  Generalized body aches beginning this morning. Shortness of breath, cough for several days. History of hypertension and COPD.  EXAM: CHEST  2 VIEW  COMPARISON:  03/14/2013  FINDINGS: There is hyperinflation of the lungs compatible with COPD. Heart is borderline in size. Rounded opacity noted at the right lung base, not well visualized on the lateral view. Left lung is clear. No effusions. No acute bony abnormality.  IMPRESSION: COPD.  Rounded opacity at the right lung base. Cannot exclude developing pneumonia.   Electronically Signed   By: Rolm Baptise M.D.   On: 09/22/2014 21:09   Ct Abdomen Pelvis W Contrast  09/22/2014   CLINICAL DATA:  Abdominal pain, site unspecified.  EXAM: CT ABDOMEN AND PELVIS WITH CONTRAST  TECHNIQUE: Multidetector CT imaging of the abdomen and pelvis was performed using the standard protocol following bolus administration of intravenous contrast.  CONTRAST:  1 OMNIPAQUE IOHEXOL 300 MG/ML SOLN, 66mL OMNIPAQUE IOHEXOL 300 MG/ML SOLN  COMPARISON:  PET-CT 11/07/2009  FINDINGS: BODY WALL: Unremarkable.  LOWER CHEST: Advanced emphysema in the  lower lungs.  ABDOMEN/PELVIS:  Liver: Scattered, circumscribed low-density lesions are most compatible with cysts. No enhancing lesions suspected.  Biliary: No evidence of biliary obstruction or stone.  Pancreas: Unremarkable.  Spleen: Unremarkable.  Adrenals: Unremarkable.  Kidneys and ureters: Bilateral hydroureteronephrosis to the level of the bilateral lower ureters where there is abrupt narrowing of the ureters by enhancing tissue that is contiguous with the prostate.  Bladder: Moderately distended. Lobulated tissue along the posterior wall is likely prostatic.  Reproductive: There is abnormal lobulated enlargement of the prostate gland with extracapsular soft tissue infiltrating the rectoprostatic angle. Enhancing material is contiguous in the lower ureters bilaterally. Bilateral pelvic and periaortic lymphadenopathy with nodes measuring up to 16 mm short axis below the left renal hilum (image 33).  Bowel: No obstruction. Negative appendix. Percutaneous gastrostomy tube which is in good position.  Retroperitoneum: No mass or adenopathy.  Peritoneum: No ascites or pneumoperitoneum.  Vascular: Extensive atherosclerosis. The patient is status post aortobiiliac grafting. Graft is patent. Left pelvic adenopathy narrows the left external iliac vein, without visible thrombosis. Notable plaque present in the proximal SMA.  OSSEOUS: New sclerotic lesions throughout the bony pelvis and spine. No pathologic fracture or definite extra osseous extension.  IMPRESSION: Findings of prostate cancer with osseous and nodal metastases. There is bilateral hydroureteronephrosis from tumoral obstruction of the distal ureters.   Electronically Signed   By: Jorje Guild M.D.   On: 09/22/2014 21:50     EKG: Independently reviewed. Atrial Fibrillation rate 68 with a +RBBB,    Assessment/Plan:   81 y.o. male with  Principal Problem:   1.    AKI (acute kidney injury)- due to Obstruction due to Tumor on CTscan   Discussed with  Family   IVFs   Hold Losartan Rx   Monitor BUN/Cr     Active Problems:   2.   Hyperkalemia- due to #1   Kayexalate 15 grams Per FT x 1 given   Monitor K+ Levels        3.   Hydronephrosis, bilateral- Due to Tumor Progression   Prostate Cancer was followed by Urology  Upon diagnosis in 2014   And Patient and Family opted for no Treatment   On Rapaflo  4.   Abdominal pain due to Tumor Progression   Pain Control PRN     5.   Left hip pain- Due to Tumor Progression   Pain Control PRN     6.   Prostate cancer metastatic to multiple sites   Prostate Cancer was followed by Urology  Upon diagnosis in 2014   And Patient and Family opted for no Treatment       7.   Hypoalbuminemia due to protein-calorie malnutrition   Has PEG Tube for feeding    changed to Nepro    8.   Coronary atherosclerosis   Stable   On ASA Rx     9.   Atrial fibrillation- Rate controlled at this Time   On ASA Rx    Not A Coumadin Candidate due to Fall Risks     10.  Severe chronic obstructive pulmonary disease   On NCO2   DUONebs PRN      11.  DVT Prophylaxis   Heparin SQ   12.   Obtain a Palliative Care Consultation for Goals of Care.      Discussed with Patient and Family      Code Status:    FULL CODE Family Communication:   Family at Bedside Disposition Plan:     Inpatient    Time spent:  66 minutes   Theressa Millard [facility] Pager [phone]  If [place] Please Contact the Day Rounding Team MD for [facility]  If 7PM-7AM, Please Contact Night-Floor Coverage  [email or web address] Password [email or web address] 09/23/2014, 1:54 AM     ADDENDUM: Patient was sen and examined on 09/23/2014

## 2014-09-23 NOTE — Progress Notes (Signed)
Pt has arrived to the unit. Has multiples skin issues on buttocks and sacrum. Family at bedside. Pt is alert and oriented. Pt oriented to room, staff, and call bell. Bed in lowest position. Full assessment to Epic. Call bell with in reach. Told to call for assists. Will continue to monitor.  Tricia [place]aca E

## 2014-09-23 NOTE — Progress Notes (Addendum)
I have seen and examined [name] at bedside and reviewed his chart. He was admitted earlier today by Dr. Arnoldo Morale. Please refer to her comprehensive assessment and care plan. I spoke with Marc. Onnie' wife over the phone. [name] is a pleasant 81 y.o. male with Untreated Prostate Cancer (07/2013), O2 dependent COPD, Hx of Necrotizing Pneumonia, s/p peg tube,Atrial Fibrillation CAD, and HTN who presented to the ED with complaints of severe RLQ ABD pain and Flank Pain associated with increased weakness the afternoon of admission.A CT scan of the ABD and Pelvis was performed and revealed an increased Prostate Cancer tumor burden with metastasis to the spine and bony pelvis with bilateral Hydronephrosis. His labs also revealed an elevated BUN/Cr of 55/2.50 and Hyperkalemia at 5.8.Chest x-ray showed "COPD. Rounded opacity at the right lung base. Cannot exclude developing pneumonia". His wife states that patient was bedridden for a week. It is possible he has an early pneumonia. Will therefore add Ceftriaxone/Zithromax and continue IV fluids. Follow palliative care recommendations. Will continue rest of management per Dr. Arnoldo Morale. Patient also has groin rash, suggesting candida infection. Will add fluconazole adjusted for crcl, and consult Wound Care.

## 2014-09-23 NOTE — Progress Notes (Signed)
Utilization review completed. Mahlia Fernando, RN, BSN. 

## 2014-09-24 ENCOUNTER — Encounter (HOSPITAL_COMMUNITY): Payer: Self-pay | Admitting: Nurse Practitioner

## 2014-09-24 ENCOUNTER — Other Ambulatory Visit: Payer: Self-pay

## 2014-09-24 ENCOUNTER — Inpatient Hospital Stay (HOSPITAL_COMMUNITY): Payer: Medicare Other

## 2014-09-24 DIAGNOSIS — I472 Ventricular tachycardia: Secondary | ICD-10-CM

## 2014-09-24 DIAGNOSIS — N179 Acute kidney failure, unspecified: Principal | ICD-10-CM

## 2014-09-24 LAB — CBC
HEMATOCRIT: 32.4 % — AB (ref 39.0–52.0)
HEMOGLOBIN: 10.1 g/dL — AB (ref 13.0–17.0)
MCH: 28.5 pg (ref 26.0–34.0)
MCHC: 31.2 g/dL (ref 30.0–36.0)
MCV: 91.3 fL (ref 78.0–100.0)
Platelets: 284 10*3/uL (ref 150–400)
RBC: 3.55 MIL/uL — AB (ref 4.22–5.81)
RDW: 14.9 % (ref 11.5–15.5)
WBC: 6.5 10*3/uL (ref 4.0–10.5)

## 2014-09-24 LAB — COMPREHENSIVE METABOLIC PANEL
ALT: 27 U/L (ref 0–53)
AST: 27 U/L (ref 0–37)
Albumin: 2.4 g/dL — ABNORMAL LOW (ref 3.5–5.2)
Alkaline Phosphatase: 99 U/L (ref 39–117)
Anion gap: 10 (ref 5–15)
BUN: 47 mg/dL — ABNORMAL HIGH (ref 6–23)
CALCIUM: 8.2 mg/dL — AB (ref 8.4–10.5)
CHLORIDE: 104 meq/L (ref 96–112)
CO2: 25 mmol/L (ref 19–32)
Creatinine, Ser: 2.29 mg/dL — ABNORMAL HIGH (ref 0.50–1.35)
GFR calc Af Amer: 27 mL/min — ABNORMAL LOW (ref >90)
GFR calc non Af Amer: 23 mL/min — ABNORMAL LOW (ref >90)
GLUCOSE: 89 mg/dL (ref 70–99)
Potassium: 4.9 mmol/L (ref 3.5–5.1)
SODIUM: 139 mmol/L (ref 135–145)
Total Bilirubin: 0.5 mg/dL (ref 0.3–1.2)
Total Protein: 6.5 g/dL (ref 6.0–8.3)

## 2014-09-24 LAB — MAGNESIUM: MAGNESIUM: 2.2 mg/dL (ref 1.5–2.5)

## 2014-09-24 MED ORDER — DEXAMETHASONE SODIUM PHOSPHATE 4 MG/ML IJ SOLN
4.0000 mg | Freq: Two times a day (BID) | INTRAMUSCULAR | Status: DC
  Administered 2014-09-24 (×2): 4 mg via INTRAVENOUS
  Filled 2014-09-24 (×5): qty 1

## 2014-09-24 MED ORDER — ACETAMINOPHEN 160 MG/5ML PO SOLN: 650.0000 mg | Freq: Four times a day (QID) | ORAL | Status: DC | PRN

## 2014-09-24 MED ORDER — ONDANSETRON HCL 4 MG PO TABS: 4.0000 mg | ORAL_TABLET | Freq: Four times a day (QID) | ORAL | Status: DC | PRN

## 2014-09-24 MED ORDER — ASPIRIN 81 MG PO CHEW
81.0000 mg | CHEWABLE_TABLET | Freq: Every day | ORAL | Status: DC
  Administered 2014-09-24 – 2014-09-26 (×3): 81 mg
  Filled 2014-09-24 (×3): qty 1

## 2014-09-24 MED ORDER — METOPROLOL TARTRATE 25 MG PO TABS
25.0000 mg | ORAL_TABLET | Freq: Two times a day (BID) | ORAL | Status: DC
  Administered 2014-09-24 – 2014-09-26 (×4): 25 mg via ORAL
  Filled 2014-09-24 (×5): qty 1

## 2014-09-24 MED ORDER — CETYLPYRIDINIUM CHLORIDE 0.05 % MT LIQD
7.0000 mL | Freq: Two times a day (BID) | OROMUCOSAL | Status: DC
  Administered 2014-09-24 – 2014-09-26 (×4): 7 mL via OROMUCOSAL

## 2014-09-24 MED ORDER — OXYCODONE HCL 5 MG PO TABS: 5.0000 mg | ORAL_TABLET | ORAL | Status: DC | PRN

## 2014-09-24 MED ORDER — NEPRO/CARBSTEADY PO LIQD
260.0000 mL | Freq: Four times a day (QID) | ORAL | Status: DC
  Administered 2014-09-24: 260 mL
  Administered 2014-09-24: 237 mL
  Administered 2014-09-25 (×2): 260 mL
  Administered 2014-09-25: 237 mL
  Administered 2014-09-25 – 2014-09-26 (×3): 260 mL
  Filled 2014-09-24: qty 474

## 2014-09-24 MED ORDER — JEVITY 1.2 CAL PO LIQD: 1000.0000 mL | ORAL | Status: DC

## 2014-09-24 MED ORDER — ONDANSETRON HCL 4 MG/2ML IJ SOLN
4.0000 mg | Freq: Four times a day (QID) | INTRAMUSCULAR | Status: DC | PRN
Start: 2014-09-24 — End: 2014-09-26
  Filled 2014-09-24: qty 2

## 2014-09-24 MED ORDER — FAMOTIDINE 40 MG/5ML PO SUSR
20.0000 mg | Freq: Every day | ORAL | Status: DC
  Administered 2014-09-24 – 2014-09-26 (×3): 20 mg
  Filled 2014-09-24 (×3): qty 2.5

## 2014-09-24 MED ORDER — NYSTATIN 100000 UNIT/GM EX POWD
Freq: Two times a day (BID) | CUTANEOUS | Status: DC
  Administered 2014-09-24 – 2014-09-26 (×5): via TOPICAL
  Filled 2014-09-24 (×2): qty 15

## 2014-09-24 MED ORDER — FUROSEMIDE 10 MG/ML IJ SOLN
20.0000 mg | Freq: Once | INTRAMUSCULAR | Status: AC
  Administered 2014-09-24: 20 mg via INTRAVENOUS
  Filled 2014-09-24: qty 2

## 2014-09-24 MED ORDER — NEPRO/CARBSTEADY PO LIQD
237.0000 mL | ORAL | Status: DC
  Administered 2014-09-24: 237 mL
  Filled 2014-09-24: qty 237

## 2014-09-24 MED ORDER — CEFTRIAXONE SODIUM IN DEXTROSE 20 MG/ML IV SOLN
1.0000 g | INTRAVENOUS | Status: DC
  Administered 2014-09-24 – 2014-09-25 (×2): 1 g via INTRAVENOUS
  Filled 2014-09-24 (×4): qty 50

## 2014-09-24 MED ORDER — FERROUS SULFATE 300 (60 FE) MG/5ML PO SYRP
300.0000 mg | ORAL_SOLUTION | Freq: Every day | ORAL | Status: DC
  Administered 2014-09-24 – 2014-09-26 (×3): 300 mg
  Filled 2014-09-24 (×4): qty 5

## 2014-09-24 NOTE — Consult Note (Signed)
CARDIOLOGY CONSULT NOTE   Patient ID: [name] MRN: [id number], DOB/AGE: 08-Apr-1919   Admit date: 09/22/2014 Date of Consult: 09/24/2014   Primary Physician: Patricia Nettle, MD Primary Cardiologist: New - previously Harwani (2012)  Pt. Profile  81 y/o male with a h/o CAD s/p stenting of the RCA in 09/2007, COPD, and end stage prostate CA, who was admitted with abdominal pain, AKI in setting of hydronephrosis and progressive metastasis, PNA, and has been found to have pvc's and a 6 beat run of asymptomatic nsvt.  Problem List  Past Medical History  Diagnosis Date  . Necrotizing pneumonia 03-20-2009  . Nodule of left lung   . Nodule of right lung     resolved  . HTN (hypertension)   . Esophageal stricture   . Dysphagia   . Hiccups   . PAF (paroxysmal atrial fibrillation)   . Anemia   . COPD (chronic obstructive pulmonary disease)   . Coronary heart disease     a. 09/2007 inf MI w/ PCI of the RCA;  b. 2012 Cath: LM nl, LAD 10-15%, LCX 10-77m, RCA 20-30ost, 20-25p, patent stent.  . Hiatal hernia   . Abdominal aneurysm   . Prostate cancer   . GERD (gastroesophageal reflux disease)   . Diverticulosis   . Anemia   . CKD (chronic kidney disease), stage IV     Past Surgical History  Procedure Laterality Date  . Eye surgery  2003    right eye macular hole repair  . Coronary stent placement      x2     Allergies  No Known Allergies  HPI   81 y/o male with the above complex problem list. He does have a h/o CAD and is s/p inferior MI with RCA stenting in 2009.  He was previously followed by Dr. Terrence Dupont but says he hasn't seen him in several years.  Last cath was in 2012, showing patent RCA stent and otw nonobs dzs.  Pts h/o also includes prostate CA, which was dx in 2014 and has been untreated upon pt wishes.  He also has COPD and h/o necrotizing pna.  He has a PEG tube in place in the setting of chronic achalasia.  He was admitted on 1/18 in the setting of RLQ and R  flank pain, though he does not currently remember why he is here.  In the ED, he was found to have AKI (55/2.5) and hyperkalemic (5.8).  Imaging showed hydronephrosis bilaterally in the setting of metastatic disease with obstruction.  CXR suggested pna and he was placed on abx.  He has been on tele and has had frequent asymptomatic pvc's and a 6 beat run of nsvt.  He currently reports dyspnea w/o chest pain.  Inpatient Medications  . amLODipine  5 mg Per Tube Daily  . aspirin  81 mg Per Tube Daily  . azithromycin  500 mg Intravenous Q24H  . budesonide  0.5 mg Nebulization BID  . cefTRIAXone (ROCEPHIN)  IV  1 g Intravenous Q24H  . dexamethasone  4 mg Intravenous Q12H  . famotidine  20 mg Per Tube Daily  . feeding supplement (NEPRO CARB STEADY)  260 mL Per Tube QID  . ferrous sulfate  300 mg Per Tube Q breakfast  . heparin  5,000 Units Subcutaneous 3 times per day  . ipratropium-albuterol  3 mL Nebulization Q4H  . nystatin   Topical BID  . sodium chloride  3 mL Intravenous Q12H  . tamsulosin  0.4 mg  Oral QPC breakfast    Family History Family History  Problem Relation Age of Onset  . Breast cancer Sister   . Cancer Sister   . Cancer Brother   . Cancer Brother   . Cancer Brother      Social History History   Social History  . Marital Status: Married    Spouse Name: N/A    Number of Children: N/A  . Years of Education: N/A   Occupational History  . Not on file.   Social History Main Topics  . Smoking status: Former Smoker -- 1.00 packs/day for 30 years    Types: Cigarettes    Quit date: 09/06/1968  . Smokeless tobacco: Never Used  . Alcohol Use: No     Comment: hx of ETOH abuse, quit x 30+ yrs ago  . Drug Use: No  . Sexual Activity: Not on file   Other Topics Concern  . Not on file   Social History Narrative   Lives in [place] with wife.  Does not routinely exercise.  Retired from the [employer].  Was in [facility] prior to attacks.    Review of  Systems  General:  No chills, fever, night sweats or weight changes.  Cardiovascular:  No chest pain, +++ dyspnea, no edema, orthopnea, palpitations, paroxysmal nocturnal dyspnea. Dermatological: No rash, lesions/masses Respiratory: No cough, dyspnea Urologic: No hematuria, dysuria Abdominal:   +++abd pain, +++occas blood in stool, no nausea, vomiting, diarrhea, melena, or hematemesis Neurologic:  No visual changes, wkns, changes in mental status. All other systems reviewed and are otherwise negative except as noted above.  Physical Exam  Blood pressure 147/71, pulse 91, temperature 98.2 F (36.8 C), temperature source Oral, resp. rate 18, height 5' 10.8" (1.798 m), weight 134 lb 8 oz (61.009 kg), SpO2 100 %.  General: Pleasant, NAD Psych: Flat affect. Neuro: Alert and oriented to person only. Moves all extremities spontaneously. HEENT: Normal  Neck: Supple without bruits or JVD. Lungs:  Resp regular and unlabored, diminished breath sounds bilat bases, crackles left base. Heart: RRR no s3, + s4, no murmurs. Abdomen: Soft, diffusely tender, non-distended, BS + x 4.  Extremities: No clubbing, cyanosis or edema. DP/PT/Radials 2+ and equal bilaterally.  Labs  Lab Results  Component Value Date   WBC 6.5 09/24/2014   HGB 10.1* 09/24/2014   HCT 32.4* 09/24/2014   MCV 91.3 09/24/2014   PLT 284 09/24/2014     Recent Labs Lab 09/24/14 0540  NA 139  K 4.9  CL 104  CO2 25  BUN 47*  CREATININE 2.29*  CALCIUM 8.2*  PROT 6.5  BILITOT 0.5  ALKPHOS 99  ALT 27  AST 27  GLUCOSE 89   Radiology/Studies  Ct Abdomen Pelvis W Contrast  09/22/2014   CLINICAL DATA:  Abdominal pain, site unspecified.  EXAM: CT ABDOMEN AND PELVIS WITH CONTRAST IMPRESSION: Findings of prostate cancer with osseous and nodal metastases. There is bilateral hydroureteronephrosis from tumoral obstruction of the distal ureters.   Electronically Signed   By: Jorje Guild M.D.   On: 09/22/2014 21:50   Dg  Chest Port 1 View  09/24/2014   CLINICAL DATA:  Shortness of breath with productive cough and chest tightness.  EXAM: PORTABLE CHEST - 1 VIEW  COMPARISON:  09/23/2014 and 09/22/2014  FINDINGS: Lungs are adequately inflated and demonstrate minimal prominence of the perihilar markings and slight worsening opacification in the right base/infrahilar region. Findings may be due to worsening mild interstitial edema although cannot exclude  infection in the right base. No evidence of effusion or pneumothorax. Cardiomediastinal silhouette and remainder of the exam is unchanged.  IMPRESSION: Mild prominence of the perihilar markings and mild worsening opacification in the right base/ infrahilar region. Findings may be due to mild vascular congestion as cannot exclude infection in the right base.   Electronically Signed   By: Marin Olp M.D.   On: 09/24/2014 09:31   ECG  RSR, 90, 1st deg avb, rbbb, r axis deviation, no acute st/t changes.  ASSESSMENT AND PLAN  1.  NSVT/CAD:  81 y/o male with h/o CAD s/p inferior MI with RCA stenting in 2009 and subsequent non-obstructive cath in 2012.  He has had frequent PVC's and a 6 beat run of asymptomatic NSVT on tele.  Electrolytes are wnl.  He is hemodynamically stable.  We will add metoprolol therapy.  Given advanced age with multiple conservatively managed life threatening co-morbidities, we would not recommend pursuing an ischemic evaluation or other noninvasive cardiac testing.  Agree with palliative care consultation (family meeting pending).  Consider discontinuation of telemetry.  2.  CAP:  Abx per IM.  3.  AKI/CKD IV:  In setting of metastatic prostate cancer with hydronephrosis and obstructive uropathy.  Creat currently stable.  4.  HTN:  Stable.  Signed, Murray Hodgkins, NP 09/24/2014, 3:53 PM   History and all data above reviewed.  Patient examined.  I agree with the findings as above.  The patient reports some SOB.  He does not report palpitations,  presyncope or syncope.  PVCs with 6 beats of NSVT noted on telemetry.  However, he did not feel this.  The patient exam reveals COR:RRR, positive S4 ,  Lungs: Clear  ,  Abd: Positive bowel sounds, no rebound no guarding, Ext No edema   .  All available labs, radiology testing, previous records reviewed. Agree with documented assessment and plan. PVCs:  No symptoms.  Start low dose beta blocker.  No further invasive or noninvasive evaluation is indicated particularly in the setting of his advanced age and severe comorbid illness.    Jeneen Rinks Maila Dukes  4:35 PM  09/24/2014

## 2014-09-24 NOTE — Progress Notes (Signed)
RN informed of 6 beat run of vtach during report, MD paged/made aware. Pt with no c/o of cp or sob.

## 2014-09-24 NOTE — Progress Notes (Signed)
Pt had 6 beat run vtach, md paged.

## 2014-09-24 NOTE — Progress Notes (Signed)
INITIAL NUTRITION ASSESSMENT  Pt meets criteria for SEVERE MALNUTRITION in the context of chronic illness as evidenced by severe fat and muscle mass loss.  DOCUMENTATION CODES Per approved criteria  -Severe malnutrition in the context of chronic illness   INTERVENTION: Initiate Nepro via PEG at 260 ml 4 times daily to provide 1872 kcal, 84 grams of protein, and 759 ml of free water.  Bolus TF regimen will provide 100% of estimated kcal needs.   Provide free water flushes once IV fluids are discontinued.  Will continue to monitor.  NUTRITION DIAGNOSIS: Inadequate oral intake related to inability to eat as evidenced by NPO status and chronic tube feeding via PEG.  Goal: Pt to meet >/= 90% of their estimated nutrition needs   Monitor:  TF tolerance, weight trends, labs, I/O's  Reason for Assessment: Low Braden Score  81 y.o. male  Admitting Dx: AKI (acute kidney injury)  ASSESSMENT: Pt with Untreated Prostate Cancer (07/2013), O2 dependent COPD, Hx of Necrotizing Pneumonia, Atrial Fibrillation CAD, and HTN who presents to the ED with complaints of severe RLQ ABD pain and Flank Pain. A CT scan of the ABD and Pelvis was performed and revealed an increased Prostate Cancer tumor burden with metastasis to the spine and bony pelvis with bilateral Hydronephrosis.  Home TF regimen via MD note: Jevity 1.2 formula 300 ml via PEG 3 times daily which provides 1080 kcal, 50 grams of protein, and 729 ml of free water. Free water flushes of 100 ml TID.  Pt reports he takes nothing by mouth at home and that sole nutrition is via PEG tube. Usual body weight is 139 lbs. Pt with a 4% weight loss in 5 months, however weight loss is not significant. Pt currently has ordered Nepro via PEG of 237 ml every 4 hours (6 times a day) which provides 2560 kcal, 115 grams of protein, and 1038 ml of free water. Current TF regimen is providing excess needs then estimated nutrition needs. RD to adjust bolus tube  feeding to better meet needs.   Nutrition Focused Physical Exam:  Subcutaneous Fat:  Orbital Region: N/A Upper Arm Region: Severe depletion Thoracic and Lumbar Region: Moderate depletion  Muscle:  Temple Region: N/A Clavicle Bone Region: Severe depletion Clavicle and Acromion Bone Region: Severe depletion Scapular Bone Region: N/A Dorsal Hand: Severe depletion Patellar Region: N/A Anterior Thigh Region: N/A Posterior Calf Region: N/A  Edema: none  Labs: Low calcium and GFR. High BUN and creatinine.  Height: Ht Readings from Last 1 Encounters:  09/23/14 5' 10.8" (1.798 m)    Weight: Wt Readings from Last 1 Encounters:  09/23/14 134 lb 8 oz (61.009 kg)    Ideal Body Weight: 171 lbs  % Ideal Body Weight: 78%  Wt Readings from Last 10 Encounters:  09/23/14 134 lb 8 oz (61.009 kg)  01/25/14 139 lb 3.2 oz (63.141 kg)  08/27/13 139 lb 9.6 oz (63.322 kg)  03/14/13 144 lb 9.6 oz (65.59 kg)  10/10/12 145 lb 12.8 oz (66.134 kg)  04/13/12 138 lb 9.6 oz (62.869 kg)  12/29/11 142 lb 6.4 oz (64.592 kg)  12/12/11 144 lb 15.6 oz (65.76 kg)  12/01/11 144 lb 9.6 oz (65.59 kg)  11/24/11 144 lb (65.318 kg)    Usual Body Weight: 139 lbs  % Usual Body Weight: 96%  BMI:  Body mass index is 18.87 kg/(m^2).  Estimated Nutritional Needs: Kcal: 1850-2000 Protein: 80-95 grams Fluid: Per MD  Skin: Pressure ulcer on buttocks, Stage II pressure ulcer  on buttocks  Diet Order: Diet NPO time specified  EDUCATION NEEDS: -Education not appropriate at this time   Intake/Output Summary (Last 24 hours) at 09/24/14 1042 Last data filed at 09/24/14 0551  Gross per 24 hour  Intake  691.5 ml  Output     70 ml  Net  621.5 ml    Last BM: 1/18  Labs:   Recent Labs Lab 09/22/14 2020 09/23/14 0702 09/24/14 0540  NA 139 135 139  K 5.8* 5.1 4.9  CL 104 102 104  CO2 24 23 25   BUN 55* 48* 47*  CREATININE 2.50* 2.29* 2.29*  CALCIUM 8.7 8.2* 8.2*  MG  --   --  2.2  GLUCOSE 94  103* 89    CBG (last 3)   Recent Labs  09/23/14 1149  GLUCAP 88    Scheduled Meds: . amLODipine  5 mg Per Tube Daily  . aspirin  81 mg Per Tube Daily  . azithromycin  500 mg Intravenous Q24H  . budesonide  0.5 mg Nebulization BID  . dexamethasone  4 mg Intravenous Q12H  . famotidine  20 mg Per Tube Daily  . feeding supplement (NEPRO CARB STEADY)  237 mL Per Tube Q4H while awake  . ferrous sulfate  300 mg Per Tube Q breakfast  . fluconazole (DIFLUCAN) IV  100 mg Intravenous Once  . heparin  5,000 Units Subcutaneous 3 times per day  . ipratropium-albuterol  3 mL Nebulization Q4H  . nystatin   Topical BID  . sodium chloride  3 mL Intravenous Q12H  . tamsulosin  0.4 mg Oral QPC breakfast    Continuous Infusions: . sodium chloride 50 mL/hr at 09/24/14 1004    Past Medical History  Diagnosis Date  . Necrotizing pneumonia 03-20-2009  . Nodule of left lung   . Nodule of right lung     resolved  . HTN (hypertension)   . Esophageal stricture   . Dysphagia   . Hiccups   . Chronic atrial fibrillation   . Anemia   . COPD (chronic obstructive pulmonary disease)   . Coronary heart disease   . Hiatal hernia   . Heart attack 09-2007  . Abdominal aneurysm   . Prostate cancer     Past Surgical History  Procedure Laterality Date  . Eye surgery  2003    right eye macular hole repair  . Coronary stent placement      x2    Kallie Locks, MS, RD, LDN Pager # [phone] After hours/ weekend pager # [phone]

## 2014-09-24 NOTE — Progress Notes (Signed)
Patient reportedly developed some hematuria when Foley catheter placement done. Catheter was therefore discontinued. Will keep condom cath instead. If persistent hematuria, consider urology consultation. Patient can use condom catheter meanwhile.

## 2014-09-24 NOTE — Progress Notes (Signed)
[facility] PROGRESS NOTE  [name] YQM:[id number] DOB: [birth year] DOA: 09/22/2014 PCP: Patricia Nettle, MD  Summary: Appreciate palliative care. [name] is a pleasant 81 y.o. male with Untreated Prostate Cancer (07/2013), O2 dependent COPD, Hx of Necrotizing Pneumonia, s/p peg tube,Atrial Fibrillation CAD, and HTN who presented to the ED with complaints of severe RLQ ABD pain and Flank Pain associated with increased weakness the afternoon of admission.A CT scan of the ABD and Pelvis was performed and revealed "Findings of prostate cancer with osseous and nodal metastases. There is bilateral hydroureteronephrosis from tumoral obstruction of the distal ureters". His labs also revealed an elevated BUN/Cr of 55/2.50 and Hyperkalemia at 5.8.Chest x-ray showed "COPD. Rounded opacity at the right lung base. Cannot exclude developing pneumonia". His wife stated that patient was bedridden for a week before the admission . It is possible he has an early pneumonia. He also has scrotal candidiasis. He is on Ceftriaxone/Zithromax/nystatin powder(to groin area). He complains of some shortness of breath today. He developed episode of nonsustained V. tach earlier today. Palliative care following to help with goals of care. Will place Foley catheter in view of acute kidney injury and groin rash. Continue gentle fluids. Plan AKI (acute kidney injury)/Hydronephrosis, bilateral/Prostate cancer metastatic to multiple sites  Place Foley catheter  Continue gentle fluids  Empiric antibiotics in view of hydronephrosis  Follow palliative care recommendations PNA (pneumonia)/Severe chronic obstructive pulmonary disease  Ceftriaxone/Zithromax/dexamethasone/bronchodilators  Repeat chest x-ray Coronary atherosclerosis/Atrial fibrillation/NSVT  Check magnesium level, repeat EKG  Otherwise Continue current management Candida rash of groin  Wound care consult  Nystatin powder  Left hip  pain  Due to metastases.  Narcotics as needed Hypoalbuminemia due to protein-calorie malnutrition  Status post PEG  Continue tube feeds DVT/GI prophylaxis  Code Status: Full Code. Family Communication: Spoke with the patient's wife over the phone yesterday. Disposition Plan: ?Home   Consultants:  Palliative care  Procedures:  None  Antibiotics:  Ceftriaxone 09/24/2014>  Zithromax 09/24/2014>  HPI/Subjective: Feels okay. Complains of some shortness of breath.  Objective: Filed Vitals:   09/24/14 0721  BP: 127/65  Pulse: 94  Temp: 98 F (36.7 C)  Resp: 16    Intake/Output Summary (Last 24 hours) at 09/24/14 0821 Last data filed at 09/24/14 0551  Gross per 24 hour  Intake  691.5 ml  Output     70 ml  Net  621.5 ml   Filed Weights   09/23/14 0029  Weight: 61.009 kg (134 lb 8 oz)    Exam:   General:  Seems anxious  Cardiovascular: S1S2 normal, No murmurs. RRR.  Respiratory: Scant wheezing  Abdomen: PEG tube in place.  Musculoskeletal: Groin rash.   Data Reviewed: Basic Metabolic Panel:  Recent Labs Lab 09/22/14 2020 09/23/14 0702 09/24/14 0540  NA 139 135 139  K 5.8* 5.1 4.9  CL 104 102 104  CO2 24 23 25   GLUCOSE 94 103* 89  BUN 55* 48* 47*  CREATININE 2.50* 2.29* 2.29*  CALCIUM 8.7 8.2* 8.2*   Liver Function Tests:  Recent Labs Lab 09/22/14 2020 09/24/14 0540  AST 32 27  ALT 32 27  ALKPHOS 107 99  BILITOT 0.6 0.5  PROT 7.2 6.5  ALBUMIN 2.8* 2.4*   No results for input(s): LIPASE, AMYLASE in the last 168 hours. No results for input(s): AMMONIA in the last 168 hours. CBC:  Recent Labs Lab 09/22/14 2020 09/23/14 0702 09/24/14 0540  WBC 8.4 6.1 6.5  NEUTROABS 7.1  --   --  HGB 10.4* 9.1* 10.1*  HCT 33.2* 29.0* 32.4*  MCV 90.5 90.6 91.3  PLT 262 255 284   Cardiac Enzymes: No results for input(s): CKTOTAL, CKMB, CKMBINDEX, TROPONINI in the last 168 hours. BNP (last 3 results) No results for input(s): PROBNP in  the last 8760 hours. CBG:  Recent Labs Lab 09/23/14 1149  GLUCAP 88    No results found for this or any previous visit (from the past 240 hour(s)).   Studies: Dg Chest 1 View  09/23/2014   CLINICAL DATA:  Cough  EXAM: CHEST - 1 VIEW  COMPARISON:  09/22/2014  FINDINGS: Cardiomediastinal silhouette is stable. Persistent streaky right basilar atelectasis or infiltrate. Minimal left basilar atelectasis. No pulmonary edema.  IMPRESSION: Persistent streaky right basilar atelectasis or infiltrate. Pneumonia cannot be excluded. Follow-up to resolution is recommended.   Electronically Signed   By: Lahoma Crocker M.D.   On: 09/23/2014 09:33   Dg Chest 2 View  09/22/2014   CLINICAL DATA:  Generalized body aches beginning this morning. Shortness of breath, cough for several days. History of hypertension and COPD.  EXAM: CHEST  2 VIEW  COMPARISON:  03/14/2013  FINDINGS: There is hyperinflation of the lungs compatible with COPD. Heart is borderline in size. Rounded opacity noted at the right lung base, not well visualized on the lateral view. Left lung is clear. No effusions. No acute bony abnormality.  IMPRESSION: COPD.  Rounded opacity at the right lung base. Cannot exclude developing pneumonia.   Electronically Signed   By: Rolm Baptise M.D.   On: 09/22/2014 21:09   Ct Abdomen Pelvis W Contrast  09/22/2014   CLINICAL DATA:  Abdominal pain, site unspecified.  EXAM: CT ABDOMEN AND PELVIS WITH CONTRAST  TECHNIQUE: Multidetector CT imaging of the abdomen and pelvis was performed using the standard protocol following bolus administration of intravenous contrast.  CONTRAST:  1 OMNIPAQUE IOHEXOL 300 MG/ML SOLN, 53mL OMNIPAQUE IOHEXOL 300 MG/ML SOLN  COMPARISON:  PET-CT 11/07/2009  FINDINGS: BODY WALL: Unremarkable.  LOWER CHEST: Advanced emphysema in the lower lungs.  ABDOMEN/PELVIS:  Liver: Scattered, circumscribed low-density lesions are most compatible with cysts. No enhancing lesions suspected.  Biliary: No  evidence of biliary obstruction or stone.  Pancreas: Unremarkable.  Spleen: Unremarkable.  Adrenals: Unremarkable.  Kidneys and ureters: Bilateral hydroureteronephrosis to the level of the bilateral lower ureters where there is abrupt narrowing of the ureters by enhancing tissue that is contiguous with the prostate.  Bladder: Moderately distended. Lobulated tissue along the posterior wall is likely prostatic.  Reproductive: There is abnormal lobulated enlargement of the prostate gland with extracapsular soft tissue infiltrating the rectoprostatic angle. Enhancing material is contiguous in the lower ureters bilaterally. Bilateral pelvic and periaortic lymphadenopathy with nodes measuring up to 16 mm short axis below the left renal hilum (image 33).  Bowel: No obstruction. Negative appendix. Percutaneous gastrostomy tube which is in good position.  Retroperitoneum: No mass or adenopathy.  Peritoneum: No ascites or pneumoperitoneum.  Vascular: Extensive atherosclerosis. The patient is status post aortobiiliac grafting. Graft is patent. Left pelvic adenopathy narrows the left external iliac vein, without visible thrombosis. Notable plaque present in the proximal SMA.  OSSEOUS: New sclerotic lesions throughout the bony pelvis and spine. No pathologic fracture or definite extra osseous extension.  IMPRESSION: Findings of prostate cancer with osseous and nodal metastases. There is bilateral hydroureteronephrosis from tumoral obstruction of the distal ureters.   Electronically Signed   By: Jorje Guild M.D.   On: 09/22/2014 21:50  Scheduled Meds: . amLODipine  5 mg Per Tube Daily  . aspirin  81 mg Per Tube Daily  . azithromycin  500 mg Intravenous Q24H  . budesonide  0.5 mg Nebulization BID  . famotidine  20 mg Per Tube Daily  . feeding supplement (NEPRO CARB STEADY)  237 mL Per Tube Q4H while awake  . ferrous sulfate  300 mg Per Tube Q breakfast  . fluconazole (DIFLUCAN) IV  100 mg Intravenous Once  .  heparin  5,000 Units Subcutaneous 3 times per day  . ipratropium-albuterol  3 mL Nebulization Q4H  . nystatin   Topical BID  . sodium chloride  3 mL Intravenous Q12H  . tamsulosin  0.4 mg Oral QPC breakfast   Continuous Infusions: . sodium chloride 75 mL/hr at 09/23/14 2138       Time spent: 25 minutes.    Isaiahs Chancy  [facility] Pager [phone]. If 7PM-7AM, please contact night-coverage at [email or web address], password [facility] 09/24/2014, 8:21 AM  LOS: 2 days

## 2014-09-24 NOTE — Progress Notes (Signed)
Was called regarding another run of asymptomatic NSVT. Also reviewed CXR which showed "Mild prominence of the perihilar markings and mild worsening opacification in the right base/ infrahilar region. Findings may be due to mild vascular congestion as cannot exclude infection in the right base". Will d/c ivf, continue antibiotics and give lasix dose. Will consult Cardiology for NSVT.

## 2014-09-25 DIAGNOSIS — E43 Unspecified severe protein-calorie malnutrition: Secondary | ICD-10-CM | POA: Insufficient documentation

## 2014-09-25 DIAGNOSIS — N1330 Unspecified hydronephrosis: Secondary | ICD-10-CM

## 2014-09-25 DIAGNOSIS — I493 Ventricular premature depolarization: Secondary | ICD-10-CM | POA: Insufficient documentation

## 2014-09-25 LAB — CBC
HCT: 31.8 % — ABNORMAL LOW (ref 39.0–52.0)
HEMOGLOBIN: 9.7 g/dL — AB (ref 13.0–17.0)
MCH: 28 pg (ref 26.0–34.0)
MCHC: 30.5 g/dL (ref 30.0–36.0)
MCV: 91.6 fL (ref 78.0–100.0)
Platelets: 304 10*3/uL (ref 150–400)
RBC: 3.47 MIL/uL — ABNORMAL LOW (ref 4.22–5.81)
RDW: 15.1 % (ref 11.5–15.5)
WBC: 8.7 10*3/uL (ref 4.0–10.5)

## 2014-09-25 LAB — COMPREHENSIVE METABOLIC PANEL
ALT: 26 U/L (ref 0–53)
AST: 42 U/L — AB (ref 0–37)
Albumin: 2.4 g/dL — ABNORMAL LOW (ref 3.5–5.2)
Alkaline Phosphatase: 117 U/L (ref 39–117)
Anion gap: 12 (ref 5–15)
BUN: 50 mg/dL — AB (ref 6–23)
CO2: 25 mmol/L (ref 19–32)
CREATININE: 2.26 mg/dL — AB (ref 0.50–1.35)
Calcium: 8.8 mg/dL (ref 8.4–10.5)
Chloride: 105 mEq/L (ref 96–112)
GFR calc Af Amer: 27 mL/min — ABNORMAL LOW (ref >90)
GFR calc non Af Amer: 23 mL/min — ABNORMAL LOW (ref >90)
Glucose, Bld: 132 mg/dL — ABNORMAL HIGH (ref 70–99)
Potassium: 5.3 mmol/L — ABNORMAL HIGH (ref 3.5–5.1)
Sodium: 142 mmol/L (ref 135–145)
Total Bilirubin: 0.4 mg/dL (ref 0.3–1.2)
Total Protein: 6.4 g/dL (ref 6.0–8.3)

## 2014-09-25 LAB — GLUCOSE, CAPILLARY
GLUCOSE-CAPILLARY: 135 mg/dL — AB (ref 70–99)
Glucose-Capillary: 135 mg/dL — ABNORMAL HIGH (ref 70–99)
Glucose-Capillary: 113 mg/dL — ABNORMAL HIGH (ref 70–99)
Glucose-Capillary: 110 mg/dL — ABNORMAL HIGH (ref 70–99)

## 2014-09-25 LAB — MAGNESIUM: Magnesium: 2.3 mg/dL (ref 1.5–2.5)

## 2014-09-25 LAB — PHOSPHORUS: Phosphorus: 3.9 mg/dL (ref 2.3–4.6)

## 2014-09-25 MED ORDER — SODIUM POLYSTYRENE SULFONATE 15 GM/60ML PO SUSP
15.0000 g | Freq: Once | ORAL | Status: DC
  Filled 2014-09-25: qty 60

## 2014-09-25 MED ORDER — ONDANSETRON HCL 4 MG/2ML IJ SOLN: 4.0000 mg | Freq: Four times a day (QID) | INTRAMUSCULAR | Status: DC | PRN

## 2014-09-25 MED ORDER — SODIUM POLYSTYRENE SULFONATE 15 GM/60ML PO SUSP
15.0000 g | Freq: Once | ORAL | Status: AC
  Administered 2014-09-25: 15 g
  Filled 2014-09-25: qty 60

## 2014-09-25 MED ORDER — IPRATROPIUM-ALBUTEROL 0.5-2.5 (3) MG/3ML IN SOLN
3.0000 mL | Freq: Two times a day (BID) | RESPIRATORY_TRACT | Status: DC
  Administered 2014-09-25 – 2014-09-26 (×3): 3 mL via RESPIRATORY_TRACT
  Filled 2014-09-25 (×3): qty 3

## 2014-09-25 MED ORDER — IPRATROPIUM-ALBUTEROL 0.5-2.5 (3) MG/3ML IN SOLN
3.0000 mL | RESPIRATORY_TRACT | Status: DC | PRN
  Administered 2014-09-25: 3 mL via RESPIRATORY_TRACT
  Filled 2014-09-25: qty 3

## 2014-09-25 NOTE — Clinical Social Work Note (Signed)
Clinical Social Work Department BRIEF PSYCHOSOCIAL ASSESSMENT 09/25/2014  Patient:  [name], [name]     Account Number:  [id number]     Admit date:  09/22/2014  Clinical Social Worker:  Myles Lipps  Date/Time:  09/25/2014 05:00 PM  Referred by:  Physician  Date Referred:  09/25/2014 Referred for  Residential hospice placement   Other Referral:   Interview type:  Family Other interview type:   Spoke with patient wife and daughter at bedside    PSYCHOSOCIAL DATA Living Status:  WIFE Admitted from facility:   Level of care:   Primary support name:  Krishay Faro [phone] Primary support relationship to patient:  SPOUSE Degree of support available:   Strong    CURRENT CONCERNS Current Concerns  Post-Acute Placement   Other Concerns:    SOCIAL WORK ASSESSMENT / PLAN Clinical Social Worker met with patient wife and daughter at bedside to offer support and discuss patient needs at discharge.  Patient wife states that family had a long conversation with MD regarding patient plan of care, prognosis, and plans following hospitalization.  Patient wife and daughter both agreed to transition patient to comfort care and pursue residential hospice placement.     Patient family states preference to [facility], however per [employer] there is no current availability and no planned availability.  Patient family feeling very torn but agreeable to referrals placed at both [facility] and [facility].  CSW placed referrals to both locations and await bed availability for possible discharge tomorrow.  Patient family remains undecided of which facility if both had availability. Patient family agreeable with plan and understanding of patient potential discharge for tomorrow.  CSW remains available for support and to facilitate patient discharge needs once bed available and patient medically ready.   Assessment/plan status:  Psychosocial Support/Ongoing  Assessment of Needs Other assessment/ plan:   Information/referral to community resources:   Holiday representative offered additional residential hospice choice, along with SNF with Hospice and home with Hospice since primary preference of [place] was unavailable.    PATIENT'S/FAMILY'S RESPONSE TO PLAN OF CARE: Patient alert and oriented to person but good family support at bedside.  Patient family very understanding and realistic regarding patient prognosis following hospitalization.  Patient family agreeable with residential hospice placement and hopeful that something will become available at [facility].  Patient family verbalized understanding of CSW role and appreciation for support.

## 2014-09-25 NOTE — Care Management Note (Unsigned)
    Page 1 of 1   09/25/2014     6:01:01 PM CARE MANAGEMENT NOTE 09/25/2014  Patient:  [name], [name]   Account Number:  [id number]  Date Initiated:  09/25/2014  Documentation initiated by:  Tomi Bamberger  Subjective/Objective Assessment:   dx abd pain, akf, prostate ca, afib  admit- lives with spouse.     Action/Plan:   palliative consult   Anticipated DC Date:  09/26/2014   Anticipated DC Plan:  [place]  CM consult      Choice offered to / List presented to:             Status of service:  In process, will continue to follow Medicare Important Message given?  YES (If response is "NO", the following Medicare IM given date fields will be blank) Date Medicare IM given:  09/25/2014 Medicare IM given by:  Tomi Bamberger Date Additional Medicare IM given:   Additional Medicare IM given by:    Discharge Disposition:    Per UR Regulation:  Reviewed for med. necessity/level of care/duration of stay  If discussed at [place] of Stay Meetings, dates discussed:    Comments:  09/25/14 [place], BSN [phone] patient lives with spouse, patient may need a neprostomy tube, Dr. Janice Norrie to see patient.  NCM spoke with wife and and daughter, they wanted to know what home hospice consist of and how long the RN would be there.  NCM informed them of the team RN, Chaplain, aid and volunteer.  Informed them that the RN would not be there longer than an hour or less and the aide as well.  Wife states she could not handle patient by her self at home and would like CSW to check on Residential Hospice for patient.  This information was relayed to [place].

## 2014-09-25 NOTE — Consult Note (Signed)
Urology Consult   Physician requesting consult: Dr. Shanon Brow Tat  Reason for consult: Bilateral hydronephrosis, presumed metastatic prostate cancer  History of Present Illness: [name] is a 81 y.o. gentleman followed by Dr. Lowella Bandy for presumed prostate cancer.  His last evaluation was in December at which time his PSA was 109.  He has never undergone a prostate biopsy for tissue diagnosis but has presumed prostate cancer based on his PSA and prior imaging findings.  His CT scan of the abdomen in October demonstrated bilateral hydroureteronephrosis down to the ureterovesical junction bilaterally and likely related to his locally advanced prostate cancer which was consistent with his imaging findings. He does have known renal dysfunction chronically that may be multifactorial but is likely affected by partial ureteral obstruction.  His Cr was 1.72 in early December 2015.  Mr. Beeney and his family have wished to avoid any invasive procedures or treatment unless necessary to help provide palliation.   He is admitted to the hospital with complaints of abdominal pain.  Per his admission note, this was located primarily toward the right lower quadrant although the patient points to his lower mid abdomen today.  A repeat CT scan was performed on 09/22/14 which continued to demonstrate bilateral hydroureteronephrosis and what appeared to be a full bladder. He was also noted to have development of progressive sclerotic lesions of the spine and pelvis with development of enlarging pelvic and retroperitoneal lymphadenopathy. He since has had a Foley catheter placed with about 700 cc of urine thus far today that is grossly clear. He subjectively denies difficulty voiding prior to catheter placement. His Cr was 2.50 upon admission and was 2.26 today.  He is quite frail and has a gastrostomy tube for tube feeds.   Past Medical History  Diagnosis Date  . Necrotizing pneumonia 03-20-2009  . Nodule of left  lung   . Nodule of right lung     resolved  . HTN (hypertension)   . Esophageal stricture   . Dysphagia   . Hiccups   . PAF (paroxysmal atrial fibrillation)   . Anemia   . COPD (chronic obstructive pulmonary disease)   . Coronary heart disease     a. 09/2007 inf MI w/ PCI of the RCA;  b. 2012 Cath: LM nl, LAD 10-15%, LCX 10-4m, RCA 20-30ost, 20-25p, patent stent.  . Hiatal hernia   . Abdominal aneurysm   . Prostate cancer   . GERD (gastroesophageal reflux disease)   . Diverticulosis   . Anemia   . CKD (chronic kidney disease), stage IV     Past Surgical History  Procedure Laterality Date  . Eye surgery  2003    right eye macular hole repair  . Coronary stent placement      x2    Current Hospital Medications:  Home Meds:    Medication List    ASK your doctor about these medications        amLODipine 5 MG tablet  Commonly known as:  NORVASC  Place 1 tablet (5 mg total) into feeding tube daily.     aspirin 81 MG tablet  81 mg by PEG Tube route daily.     budesonide 0.5 MG/2ML nebulizer solution  Commonly known as:  PULMICORT  Take 0.5 mg by nebulization 2 (two) times daily. For shortness of breath     esomeprazole 40 MG capsule  Commonly known as:  NEXIUM  40 mg by PEG Tube route daily before breakfast.  JEVITY PLUS Liqd  300 mLs by PEG Tube route 3 (three) times daily. 318ml at 7am, 12pm, 5pm. Each time tube flushes 176ml past tube feeds     losartan 50 MG tablet  Commonly known as:  COZAAR  50 mg by PEG Tube route 2 (two) times daily.     silodosin 8 MG Caps capsule  Commonly known as:  RAPAFLO  8 mg by PEG Tube route daily with breakfast.     SLOW FE 160 (50 FE) MG Tbcr SR tablet  Generic drug:  ferrous sulfate  1 tablet by PEG Tube route 2 (two) times daily.        Scheduled Meds: . amLODipine  5 mg Per Tube Daily  . antiseptic oral rinse  7 mL Mouth Rinse BID  . aspirin  81 mg Per Tube Daily  . budesonide  0.5 mg Nebulization BID  .  cefTRIAXone (ROCEPHIN)  IV  1 g Intravenous Q24H  . famotidine  20 mg Per Tube Daily  . feeding supplement (NEPRO CARB STEADY)  260 mL Per Tube QID  . ferrous sulfate  300 mg Per Tube Q breakfast  . heparin  5,000 Units Subcutaneous 3 times per day  . ipratropium-albuterol  3 mL Nebulization BID  . metoprolol tartrate  25 mg Oral BID  . nystatin   Topical BID  . sodium chloride  3 mL Intravenous Q12H  . tamsulosin  0.4 mg Oral QPC breakfast   Continuous Infusions:  PRN Meds:.acetaminophen (TYLENOL) oral liquid 160 mg/5 mL, acetaminophen **OR** acetaminophen, HYDROmorphone (DILAUDID) injection, ipratropium-albuterol, ondansetron **OR** ondansetron (ZOFRAN) IV, oxyCODONE  Allergies: No Known Allergies  Family History  Problem Relation Age of Onset  . Breast cancer Sister   . Cancer Sister   . Cancer Brother   . Cancer Brother   . Cancer Brother     Social History:  reports that he quit smoking about 46 years ago. His smoking use included Cigarettes. He has a 30 pack-year smoking history. He has never used smokeless tobacco. He reports that he does not drink alcohol or use illicit drugs.  ROS: A complete review of systems was performed.  Pt complains of subjective shortness of breath although his oxygen saturation levels have been normal. He denies flank pain.  Physical Exam:  Vital signs in last 24 hours: Temp:  [97.7 F (36.5 C)-98.9 F (37.2 C)] 97.7 F (36.5 C) (01/20 1430) Pulse Rate:  [89-118] 89 (01/20 0533) Resp:  [16-20] 20 (01/20 1430) BP: (134-185)/(70-94) 134/75 mmHg (01/20 1430) SpO2:  [97 %-100 %] 97 % (01/20 1430) Constitutional:  Alert and oriented, No acute distress Cardiovascular: Regular rate and rhythm, No JVD Respiratory: Normal respiratory effort, Lungs clear bilaterally GI: Abdomen is soft, moderate diffuse tenderness without rebound tenderness or guarding, nondistended, no abdominal masses GU: No CVA tenderness, Catheter in place draining grossly clear  urine Lymphatic: No lymphadenopathy Neurologic: Grossly intact, no focal deficits Psychiatric: Normal mood and affect  Laboratory Data:   Recent Labs  09/22/14 2020 09/23/14 0702 09/24/14 0540 09/25/14 0700  WBC 8.4 6.1 6.5 8.7  HGB 10.4* 9.1* 10.1* 9.7*  HCT 33.2* 29.0* 32.4* 31.8*  PLT 262 255 284 304     Recent Labs  09/22/14 2020 09/23/14 0702 09/24/14 0540 09/25/14 0700  NA 139 135 139 142  K 5.8* 5.1 4.9 5.3*  CL 104 102 104 105  GLUCOSE 94 103* 89 132*  BUN 55* 48* 47* 50*  CALCIUM 8.7 8.2* 8.2* 8.8  CREATININE  2.50* 2.29* 2.29* 2.26*     Results for orders placed or performed during the hospital encounter of 09/22/14 (from the past 24 hour(s))  CBC     Status: Abnormal   Collection Time: 09/25/14  7:00 AM  Result Value Ref Range   WBC 8.7 4.0 - 10.5 K/uL   RBC 3.47 (L) 4.22 - 5.81 MIL/uL   Hemoglobin 9.7 (L) 13.0 - 17.0 g/dL   HCT 31.8 (L) 39.0 - 52.0 %   MCV 91.6 78.0 - 100.0 fL   MCH 28.0 26.0 - 34.0 pg   MCHC 30.5 30.0 - 36.0 g/dL   RDW 15.1 11.5 - 15.5 %   Platelets 304 150 - 400 K/uL  Comprehensive metabolic panel     Status: Abnormal   Collection Time: 09/25/14  7:00 AM  Result Value Ref Range   Sodium 142 135 - 145 mmol/L   Potassium 5.3 (H) 3.5 - 5.1 mmol/L   Chloride 105 96 - 112 mEq/L   CO2 25 19 - 32 mmol/L   Glucose, Bld 132 (H) 70 - 99 mg/dL   BUN 50 (H) 6 - 23 mg/dL   Creatinine, Ser 2.26 (H) 0.50 - 1.35 mg/dL   Calcium 8.8 8.4 - 10.5 mg/dL   Total Protein 6.4 6.0 - 8.3 g/dL   Albumin 2.4 (L) 3.5 - 5.2 g/dL   AST 42 (H) 0 - 37 U/L   ALT 26 0 - 53 U/L   Alkaline Phosphatase 117 39 - 117 U/L   Total Bilirubin 0.4 0.3 - 1.2 mg/dL   GFR calc non Af Amer 23 (L) >90 mL/min   GFR calc Af Amer 27 (L) >90 mL/min   Anion gap 12 5 - 15  Magnesium     Status: None   Collection Time: 09/25/14  7:00 AM  Result Value Ref Range   Magnesium 2.3 1.5 - 2.5 mg/dL  Phosphorus     Status: None   Collection Time: 09/25/14  7:00 AM  Result  Value Ref Range   Phosphorus 3.9 2.3 - 4.6 mg/dL  Glucose, capillary     Status: Abnormal   Collection Time: 09/25/14  7:56 AM  Result Value Ref Range   Glucose-Capillary 135 (H) 70 - 99 mg/dL  Glucose, capillary     Status: Abnormal   Collection Time: 09/25/14 12:08 PM  Result Value Ref Range   Glucose-Capillary 135 (H) 70 - 99 mg/dL   Recent Results (from the past 240 hour(s))  Culture, Urine     Status: None (Preliminary result)   Collection Time: 09/23/14  9:46 PM  Result Value Ref Range Status   Specimen Description URINE, RANDOM  Final   Special Requests NONE  Final   Colony Count PENDING  Incomplete   Culture   Final    Culture reincubated for better growth Performed at [facility]    Report Status PENDING  Incomplete    Renal Function:  Recent Labs  09/22/14 2020 09/23/14 0702 09/24/14 0540 09/25/14 0700  CREATININE 2.50* 2.29* 2.29* 2.26*   Estimated Creatinine Clearance: 17.6 mL/min (by C-G formula based on Cr of 2.26).  Radiologic Imaging:    I independently reviewed his recent CT scan studies. Findings are as dictated above.  Impression/Recommendation 1) Metastatic prostate cancer:  There is radiologic evidence of measurable metastatic prostate cancer. Mr. Wurzer has wished to avoid any therapy until necessary.  At this time, he likely would benefit from androgen deprivation for treatment to prevent development of pain  symptoms related to his disease. He benefit from degarelix during his hospitalization to begin ADT without concern for tesstosterone flare.  I will leave the final decision to Dr. Janice Norrie. I think his clinical picture is clear enough that a tissue diagnosis is not necessary to institute treatment from a clinical standpoint although insurance requirements may possibly require it. I think his current abdominal pain is likely unrelated to his malignancy.  2) Bilateral hydronephrosis: This is chronic and has been present for some time based  on his prior imaging.  His renal function is stable and even slightly improved from baseline. He does not have flank pain.  Considering his age and comorbidities, I think it makes sense to avoid intervention for ureteral obstruction at this time unless he develops acute infection or pain or worsening renal dysfunction.  Palliative care consult will be very helpful defining level of intervention that patient desires.  Ok to maintain catheter during his hospitalization but he likely would benefit from a voiding trial prior to discharge once stable.  Kasara Schomer,LES 09/25/2014, 5:25 PM    Pryor Curia MD   CC: Dr. Shanon Brow Tat

## 2014-09-25 NOTE — Progress Notes (Addendum)
PROGRESS NOTE  [name] DDU:[id number] DOB: 1919-06-07 DOA: 09/22/2014 PCP: Patricia Nettle, MD  Assessment/Plan: AKI (acute kidney injury)/Hydronephrosis, bilateral/Prostate cancer metastatic to multiple sites  Placed Foley catheter--successful on second attempt  Continue gentle fluids  Await palliative care meeting with family Hydronephrosis with AKI - Consult urology-- Dr. Janice Norrie is pt's urologist -09/22/14--CT abdomen and pelvis shows bilateral hydronephrosis with abrupt narrowing caused by contiguous prostate tissue  RLL opacity/Severe chronic obstructive pulmonary disease   -     ?PNA vs metastasis  Ceftriaxone/Zithromax  Continue bronchodilators  D/c dexamethasone Coronary atherosclerosis/Atrial fibrillation/NSVT  Check magnesium level, repeat EKG  Appreciate cardiology recommendations--no additional testing  Otherwise Continue current management  Continue BB per cardiology Candida rash of groin  Wound care consult  Nystatin powder Left hip pain/L rib pain  Due to metastases.  Narcotics as needed Hypoalbuminemia due to severe protein-calorie malnutrition  Status post PEG  Continue tube feeds DVT/GI prophylaxis  Code Status: Full Code.   Family Communication:   Pt at beside Disposition Plan:   Home when medically stable       Procedures/Studies: Dg Chest 1 View  09/23/2014   CLINICAL DATA:  Cough  EXAM: CHEST - 1 VIEW  COMPARISON:  09/22/2014  FINDINGS: Cardiomediastinal silhouette is stable. Persistent streaky right basilar atelectasis or infiltrate. Minimal left basilar atelectasis. No pulmonary edema.  IMPRESSION: Persistent streaky right basilar atelectasis or infiltrate. Pneumonia cannot be excluded. Follow-up to resolution is recommended.   Electronically Signed   By: Lahoma Crocker M.D.   On: 09/23/2014 09:33   Dg Chest 2 View  09/22/2014   CLINICAL DATA:  Generalized body aches beginning this morning. Shortness of breath,  cough for several days. History of hypertension and COPD.  EXAM: CHEST  2 VIEW  COMPARISON:  03/14/2013  FINDINGS: There is hyperinflation of the lungs compatible with COPD. Heart is borderline in size. Rounded opacity noted at the right lung base, not well visualized on the lateral view. Left lung is clear. No effusions. No acute bony abnormality.  IMPRESSION: COPD.  Rounded opacity at the right lung base. Cannot exclude developing pneumonia.   Electronically Signed   By: Rolm Baptise M.D.   On: 09/22/2014 21:09   Ct Abdomen Pelvis W Contrast  09/22/2014   CLINICAL DATA:  Abdominal pain, site unspecified.  EXAM: CT ABDOMEN AND PELVIS WITH CONTRAST  TECHNIQUE: Multidetector CT imaging of the abdomen and pelvis was performed using the standard protocol following bolus administration of intravenous contrast.  CONTRAST:  1 OMNIPAQUE IOHEXOL 300 MG/ML SOLN, 80mL OMNIPAQUE IOHEXOL 300 MG/ML SOLN  COMPARISON:  PET-CT 11/07/2009  FINDINGS: BODY WALL: Unremarkable.  LOWER CHEST: Advanced emphysema in the lower lungs.  ABDOMEN/PELVIS:  Liver: Scattered, circumscribed low-density lesions are most compatible with cysts. No enhancing lesions suspected.  Biliary: No evidence of biliary obstruction or stone.  Pancreas: Unremarkable.  Spleen: Unremarkable.  Adrenals: Unremarkable.  Kidneys and ureters: Bilateral hydroureteronephrosis to the level of the bilateral lower ureters where there is abrupt narrowing of the ureters by enhancing tissue that is contiguous with the prostate.  Bladder: Moderately distended. Lobulated tissue along the posterior wall is likely prostatic.  Reproductive: There is abnormal lobulated enlargement of the prostate gland with extracapsular soft tissue infiltrating the rectoprostatic angle. Enhancing material is contiguous in the lower ureters bilaterally. Bilateral pelvic and periaortic lymphadenopathy with nodes measuring up to 16 mm short axis below the left renal hilum (image 33).  Bowel: No  obstruction. Negative appendix. Percutaneous gastrostomy tube which is in good position.  Retroperitoneum: No mass or adenopathy.  Peritoneum: No ascites or pneumoperitoneum.  Vascular: Extensive atherosclerosis. The patient is status post aortobiiliac grafting. Graft is patent. Left pelvic adenopathy narrows the left external iliac vein, without visible thrombosis. Notable plaque present in the proximal SMA.  OSSEOUS: New sclerotic lesions throughout the bony pelvis and spine. No pathologic fracture or definite extra osseous extension.  IMPRESSION: Findings of prostate cancer with osseous and nodal metastases. There is bilateral hydroureteronephrosis from tumoral obstruction of the distal ureters.   Electronically Signed   By: Jorje Guild M.D.   On: 09/22/2014 21:50   Dg Chest Port 1 View  09/24/2014   CLINICAL DATA:  Shortness of breath with productive cough and chest tightness.  EXAM: PORTABLE CHEST - 1 VIEW  COMPARISON:  09/23/2014 and 09/22/2014  FINDINGS: Lungs are adequately inflated and demonstrate minimal prominence of the perihilar markings and slight worsening opacification in the right base/infrahilar region. Findings may be due to worsening mild interstitial edema although cannot exclude infection in the right base. No evidence of effusion or pneumothorax. Cardiomediastinal silhouette and remainder of the exam is unchanged.  IMPRESSION: Mild prominence of the perihilar markings and mild worsening opacification in the right base/ infrahilar region. Findings may be due to mild vascular congestion as cannot exclude infection in the right base.   Electronically Signed   By: Marin Olp M.D.   On: 09/24/2014 09:31         Subjective: Patient denies fevers, chills, headache, chest pain, dyspnea, nausea, vomiting, diarrhea, abdominal pain, dysuria, hematuria   Objective: Filed Vitals:   09/24/14 2332 09/25/14 0305 09/25/14 0533 09/25/14 0813  BP:  172/94 156/70   Pulse:  99 89   Temp:   98.2 F (36.8 C) 98.9 F (37.2 C)   TempSrc:  Oral Oral   Resp:  18 18   Height:      Weight:      SpO2: 100% 100% 100% 99%    Intake/Output Summary (Last 24 hours) at 09/25/14 0829 Last data filed at 09/25/14 0352  Gross per 24 hour  Intake 1885.25 ml  Output    400 ml  Net 1485.25 ml   Weight change:  Exam:   General:  Pt is alert, follows commands appropriately, not in acute distress  HEENT: No icterus, No thrush,  [place]/AT  Cardiovascular: RRR, S1/S2, no rubs, no gallops  Respiratory: Diminished breath sounds bilateral; right greater than left basilar crackles. No wheezing   Abdomen: Soft/ epigastric tenderness without any rebound, non tender, non distended, no guarding  Extremities: No edema, No lymphangitis, No petechiae, No rashes, no synovitis  Data Reviewed: Basic Metabolic Panel:  Recent Labs Lab 09/22/14 2020 09/23/14 0702 09/24/14 0540  NA 139 135 139  K 5.8* 5.1 4.9  CL 104 102 104  CO2 24 23 25   GLUCOSE 94 103* 89  BUN 55* 48* 47*  CREATININE 2.50* 2.29* 2.29*  CALCIUM 8.7 8.2* 8.2*  MG  --   --  2.2   Liver Function Tests:  Recent Labs Lab 09/22/14 2020 09/24/14 0540  AST 32 27  ALT 32 27  ALKPHOS 107 99  BILITOT 0.6 0.5  PROT 7.2 6.5  ALBUMIN 2.8* 2.4*   No results for input(s): LIPASE, AMYLASE in the last 168 hours. No results for input(s): AMMONIA in the last 168 hours. CBC:  Recent Labs Lab 09/22/14 2020 09/23/14 0702 09/24/14  0540 09/25/14 0700  WBC 8.4 6.1 6.5 8.7  NEUTROABS 7.1  --   --   --   HGB 10.4* 9.1* 10.1* 9.7*  HCT 33.2* 29.0* 32.4* 31.8*  MCV 90.5 90.6 91.3 91.6  PLT 262 255 284 304   Cardiac Enzymes: No results for input(s): CKTOTAL, CKMB, CKMBINDEX, TROPONINI in the last 168 hours. BNP: Invalid input(s): POCBNP CBG:  Recent Labs Lab 09/23/14 1149 09/25/14 0756  GLUCAP 88 135*    Recent Results (from the past 240 hour(s))  Culture, Urine     Status: None (Preliminary result)   Collection  Time: 09/23/14  9:46 PM  Result Value Ref Range Status   Specimen Description URINE, RANDOM  Final   Special Requests NONE  Final   Colony Count PENDING  Incomplete   Culture   Final    Culture reincubated for better growth Performed at [facility]    Report Status PENDING  Incomplete     Scheduled Meds: . amLODipine  5 mg Per Tube Daily  . antiseptic oral rinse  7 mL Mouth Rinse BID  . aspirin  81 mg Per Tube Daily  . azithromycin  500 mg Intravenous Q24H  . budesonide  0.5 mg Nebulization BID  . cefTRIAXone (ROCEPHIN)  IV  1 g Intravenous Q24H  . dexamethasone  4 mg Intravenous Q12H  . famotidine  20 mg Per Tube Daily  . feeding supplement (NEPRO CARB STEADY)  260 mL Per Tube QID  . ferrous sulfate  300 mg Per Tube Q breakfast  . heparin  5,000 Units Subcutaneous 3 times per day  . ipratropium-albuterol  3 mL Nebulization BID  . metoprolol tartrate  25 mg Oral BID  . nystatin   Topical BID  . sodium chloride  3 mL Intravenous Q12H  . tamsulosin  0.4 mg Oral QPC breakfast   Continuous Infusions:    Jream Broyles, DO  [facility] Pager [phone]  If 7PM-7AM, please contact night-coverage [email or web address] Password [email or web address] 09/25/2014, 8:29 AM   LOS: 3 days

## 2014-09-25 NOTE — Progress Notes (Signed)
SUBJECTIVE:  No chest pain.  No palpitations.     PHYSICAL EXAM Filed Vitals:   09/25/14 0533 09/25/14 0813 09/25/14 1008 09/25/14 1430  BP: 156/70  168/76 134/75  Pulse: 89     Temp: 98.9 F (37.2 C)  98.4 F (36.9 C) 97.7 F (36.5 C)  TempSrc: Oral  Oral Oral  Resp: 18  20 20   Height:      Weight:      SpO2: 100% 99% 100% 97%   General: No distress Lungs:  Clear Heart:  RRR Abdomen:  Positive bowel sounds, no rebound no guarding Extremities:  No edema   LABS:  Results for orders placed or performed during the hospital encounter of 09/22/14 (from the past 24 hour(s))  CBC     Status: Abnormal   Collection Time: 09/25/14  7:00 AM  Result Value Ref Range   WBC 8.7 4.0 - 10.5 K/uL   RBC 3.47 (L) 4.22 - 5.81 MIL/uL   Hemoglobin 9.7 (L) 13.0 - 17.0 g/dL   HCT 31.8 (L) 39.0 - 52.0 %   MCV 91.6 78.0 - 100.0 fL   MCH 28.0 26.0 - 34.0 pg   MCHC 30.5 30.0 - 36.0 g/dL   RDW 15.1 11.5 - 15.5 %   Platelets 304 150 - 400 K/uL  Comprehensive metabolic panel     Status: Abnormal   Collection Time: 09/25/14  7:00 AM  Result Value Ref Range   Sodium 142 135 - 145 mmol/L   Potassium 5.3 (H) 3.5 - 5.1 mmol/L   Chloride 105 96 - 112 mEq/L   CO2 25 19 - 32 mmol/L   Glucose, Bld 132 (H) 70 - 99 mg/dL   BUN 50 (H) 6 - 23 mg/dL   Creatinine, Ser 2.26 (H) 0.50 - 1.35 mg/dL   Calcium 8.8 8.4 - 10.5 mg/dL   Total Protein 6.4 6.0 - 8.3 g/dL   Albumin 2.4 (L) 3.5 - 5.2 g/dL   AST 42 (H) 0 - 37 U/L   ALT 26 0 - 53 U/L   Alkaline Phosphatase 117 39 - 117 U/L   Total Bilirubin 0.4 0.3 - 1.2 mg/dL   GFR calc non Af Amer 23 (L) >90 mL/min   GFR calc Af Amer 27 (L) >90 mL/min   Anion gap 12 5 - 15  Magnesium     Status: None   Collection Time: 09/25/14  7:00 AM  Result Value Ref Range   Magnesium 2.3 1.5 - 2.5 mg/dL  Phosphorus     Status: None   Collection Time: 09/25/14  7:00 AM  Result Value Ref Range   Phosphorus 3.9 2.3 - 4.6 mg/dL  Glucose, capillary     Status: Abnormal     Collection Time: 09/25/14  7:56 AM  Result Value Ref Range   Glucose-Capillary 135 (H) 70 - 99 mg/dL  Glucose, capillary     Status: Abnormal   Collection Time: 09/25/14 12:08 PM  Result Value Ref Range   Glucose-Capillary 135 (H) 70 - 99 mg/dL  Glucose, capillary     Status: Abnormal   Collection Time: 09/25/14  5:32 PM  Result Value Ref Range   Glucose-Capillary 113 (H) 70 - 99 mg/dL    Intake/Output Summary (Last 24 hours) at 09/25/14 1738 Last data filed at 09/25/14 1539  Gross per 24 hour  Intake      0 ml  Output   1100 ml  Net  -1100 ml     ASSESSMENT  AND PLAN:  PVCs:   No symptomatic arrhythmias.  Seems to have fewer PVCs on telemetry Tolerating beta blocker.     No further cardiac recommendations.    Please call with further questions.   Minus Breeding 09/25/2014 5:38 PM

## 2014-09-25 NOTE — Progress Notes (Signed)
PROGRESS NOTE  [name] MBT:[id number] DOB: [birth year] DOA: 09/22/2014 PCP: Patricia Nettle, MD  Assessment/Plan: AKI (acute kidney injury)/Hydronephrosis, bilateral/Prostate cancer metastatic to multiple sites  Placed Foley catheter--successful on second attempt  Continue gentle fluids  Await palliative care meeting with family Hydronephrosis with AKI - Consult urology-- Dr. Janice Norrie is pt's urologist -09/22/14--CT abdomen and pelvis shows bilateral hydronephrosis with abrupt narrowing caused by contiguous prostate tissue  Goals of Care -had family meeting with wife and daughter at bedside -discussed palliative care and hospice and concepts -they have decided to make pt DNR with transition of care to comfort care -Pt's life expectancy likely <6 weeks -spoke with social work for possible d/c to [employer] -d/c further labs and xrays RLL opacity/Severe chronic obstructive pulmonary disease  - ?PNA vs metastasis  Ceftriaxone/Zithromax  Continue bronchodilators  D/c dexamethasone Coronary atherosclerosis/Atrial fibrillation/NSVT  Check magnesium level, repeat EKG  Appreciate cardiology recommendations--no additional testing  Otherwise Continue current management  Continue BB per cardiology Candida rash of groin  Wound care consult  Nystatin powder Left hip pain/L rib pain  Due to metastases.  Narcotics as needed Hypoalbuminemia due to severe protein-calorie malnutrition  Status post PEG  Continue tube feeds DVT/GI prophylaxis      Family Communication:   Wife and daughter at beside  Disposition Plan:   Residential hospice    Procedures/Studies: Dg Chest 1 View  09/23/2014   CLINICAL DATA:  Cough  EXAM: CHEST - 1 VIEW  COMPARISON:  09/22/2014  FINDINGS: Cardiomediastinal silhouette is stable. Persistent streaky right basilar atelectasis or infiltrate. Minimal left basilar atelectasis. No pulmonary edema.  IMPRESSION: Persistent  streaky right basilar atelectasis or infiltrate. Pneumonia cannot be excluded. Follow-up to resolution is recommended.   Electronically Signed   By: Lahoma Crocker M.D.   On: 09/23/2014 09:33   Dg Chest 2 View  09/22/2014   CLINICAL DATA:  Generalized body aches beginning this morning. Shortness of breath, cough for several days. History of hypertension and COPD.  EXAM: CHEST  2 VIEW  COMPARISON:  03/14/2013  FINDINGS: There is hyperinflation of the lungs compatible with COPD. Heart is borderline in size. Rounded opacity noted at the right lung base, not well visualized on the lateral view. Left lung is clear. No effusions. No acute bony abnormality.  IMPRESSION: COPD.  Rounded opacity at the right lung base. Cannot exclude developing pneumonia.   Electronically Signed   By: Rolm Baptise M.D.   On: 09/22/2014 21:09   Ct Abdomen Pelvis W Contrast  09/22/2014   CLINICAL DATA:  Abdominal pain, site unspecified.  EXAM: CT ABDOMEN AND PELVIS WITH CONTRAST  TECHNIQUE: Multidetector CT imaging of the abdomen and pelvis was performed using the standard protocol following bolus administration of intravenous contrast.  CONTRAST:  1 OMNIPAQUE IOHEXOL 300 MG/ML SOLN, 77mL OMNIPAQUE IOHEXOL 300 MG/ML SOLN  COMPARISON:  PET-CT 11/07/2009  FINDINGS: BODY WALL: Unremarkable.  LOWER CHEST: Advanced emphysema in the lower lungs.  ABDOMEN/PELVIS:  Liver: Scattered, circumscribed low-density lesions are most compatible with cysts. No enhancing lesions suspected.  Biliary: No evidence of biliary obstruction or stone.  Pancreas: Unremarkable.  Spleen: Unremarkable.  Adrenals: Unremarkable.  Kidneys and ureters: Bilateral hydroureteronephrosis to the level of the bilateral lower ureters where there is abrupt narrowing of the ureters by enhancing tissue that is contiguous with the prostate.  Bladder: Moderately distended. Lobulated tissue along the posterior wall is likely prostatic.  Reproductive: There is abnormal  lobulated enlargement  of the prostate gland with extracapsular soft tissue infiltrating the rectoprostatic angle. Enhancing material is contiguous in the lower ureters bilaterally. Bilateral pelvic and periaortic lymphadenopathy with nodes measuring up to 16 mm short axis below the left renal hilum (image 33).  Bowel: No obstruction. Negative appendix. Percutaneous gastrostomy tube which is in good position.  Retroperitoneum: No mass or adenopathy.  Peritoneum: No ascites or pneumoperitoneum.  Vascular: Extensive atherosclerosis. The patient is status post aortobiiliac grafting. Graft is patent. Left pelvic adenopathy narrows the left external iliac vein, without visible thrombosis. Notable plaque present in the proximal SMA.  OSSEOUS: New sclerotic lesions throughout the bony pelvis and spine. No pathologic fracture or definite extra osseous extension.  IMPRESSION: Findings of prostate cancer with osseous and nodal metastases. There is bilateral hydroureteronephrosis from tumoral obstruction of the distal ureters.   Electronically Signed   By: Jorje Guild M.D.   On: 09/22/2014 21:50   Dg Chest Port 1 View  09/24/2014   CLINICAL DATA:  Shortness of breath with productive cough and chest tightness.  EXAM: PORTABLE CHEST - 1 VIEW  COMPARISON:  09/23/2014 and 09/22/2014  FINDINGS: Lungs are adequately inflated and demonstrate minimal prominence of the perihilar markings and slight worsening opacification in the right base/infrahilar region. Findings may be due to worsening mild interstitial edema although cannot exclude infection in the right base. No evidence of effusion or pneumothorax. Cardiomediastinal silhouette and remainder of the exam is unchanged.  IMPRESSION: Mild prominence of the perihilar markings and mild worsening opacification in the right base/ infrahilar region. Findings may be due to mild vascular congestion as cannot exclude infection in the right base.   Electronically Signed   By: Marin Olp M.D.   On:  09/24/2014 09:31         Subjective: Patient denies fevers, chills, headache, chest pain, dyspnea, nausea, vomiting, diarrhea, abdominal pain, dysuria, hematuria   Objective: Filed Vitals:   09/25/14 0305 09/25/14 0533 09/25/14 0813 09/25/14 1008  BP: 172/94 156/70  168/76  Pulse: 99 89    Temp: 98.2 F (36.8 C) 98.9 F (37.2 C)  98.4 F (36.9 C)  TempSrc: Oral Oral  Oral  Resp: 18 18  20   Height:      Weight:      SpO2: 100% 100% 99% 100%    Intake/Output Summary (Last 24 hours) at 09/25/14 1414 Last data filed at 09/25/14 0856  Gross per 24 hour  Intake      0 ml  Output    400 ml  Net   -400 ml   Weight change:  Exam:   General:  Pt is alert, follows commands appropriately, not in acute distress  HEENT: No icterus, No thrush,[place]/AT  Cardiovascular: RRR, S1/S2, no rubs, no gallops  Respiratory: Diminished breath sounds bilateral; right greater than left basilar crackles. No wheezing   Abdomen: Soft/+BS, non tender, non distended, no guarding  Extremities: No edema, No lymphangitis, No petechiae, No rashes, no synovitis  Data Reviewed: Basic Metabolic Panel:  Recent Labs Lab 09/22/14 2020 09/23/14 0702 09/24/14 0540 09/25/14 0700  NA 139 135 139 142  K 5.8* 5.1 4.9 5.3*  CL 104 102 104 105  CO2 24 23 25 25   GLUCOSE 94 103* 89 132*  BUN 55* 48* 47* 50*  CREATININE 2.50* 2.29* 2.29* 2.26*  CALCIUM 8.7 8.2* 8.2* 8.8  MG  --   --  2.2 2.3  PHOS  --   --   --  3.9   Liver Function Tests:  Recent Labs Lab 09/22/14 2020 09/24/14 0540 09/25/14 0700  AST 32 27 42*  ALT 32 27 26  ALKPHOS 107 99 117  BILITOT 0.6 0.5 0.4  PROT 7.2 6.5 6.4  ALBUMIN 2.8* 2.4* 2.4*   No results for input(s): LIPASE, AMYLASE in the last 168 hours. No results for input(s): AMMONIA in the last 168 hours. CBC:  Recent Labs Lab 09/22/14 2020 09/23/14 0702 09/24/14 0540 09/25/14 0700  WBC 8.4 6.1 6.5 8.7  NEUTROABS 7.1  --   --   --   HGB 10.4* 9.1* 10.1*  9.7*  HCT 33.2* 29.0* 32.4* 31.8*  MCV 90.5 90.6 91.3 91.6  PLT 262 255 284 304   Cardiac Enzymes: No results for input(s): CKTOTAL, CKMB, CKMBINDEX, TROPONINI in the last 168 hours. BNP: Invalid input(s): POCBNP CBG:  Recent Labs Lab 09/23/14 1149 09/25/14 0756 09/25/14 1208  GLUCAP 88 135* 135*    Recent Results (from the past 240 hour(s))  Culture, Urine     Status: None (Preliminary result)   Collection Time: 09/23/14  9:46 PM  Result Value Ref Range Status   Specimen Description URINE, RANDOM  Final   Special Requests NONE  Final   Colony Count PENDING  Incomplete   Culture   Final    Culture reincubated for better growth Performed at [facility]    Report Status PENDING  Incomplete     Scheduled Meds: . amLODipine  5 mg Per Tube Daily  . antiseptic oral rinse  7 mL Mouth Rinse BID  . aspirin  81 mg Per Tube Daily  . budesonide  0.5 mg Nebulization BID  . cefTRIAXone (ROCEPHIN)  IV  1 g Intravenous Q24H  . famotidine  20 mg Per Tube Daily  . feeding supplement (NEPRO CARB STEADY)  260 mL Per Tube QID  . ferrous sulfate  300 mg Per Tube Q breakfast  . heparin  5,000 Units Subcutaneous 3 times per day  . ipratropium-albuterol  3 mL Nebulization BID  . metoprolol tartrate  25 mg Oral BID  . nystatin   Topical BID  . sodium chloride  3 mL Intravenous Q12H  . sodium polystyrene  15 g Oral Once  . tamsulosin  0.4 mg Oral QPC breakfast   Continuous Infusions:    Bevan Vu, DO  [facility] Pager [phone]  If 7PM-7AM, please contact night-coverage [email or web address] Password [email or web address] 09/25/2014, 2:15 PM   LOS: 3 days

## 2014-09-26 LAB — GLUCOSE, CAPILLARY: GLUCOSE-CAPILLARY: 117 mg/dL — AB (ref 70–99)

## 2014-09-26 MED ORDER — DEGARELIX ACETATE 120 MG ~~LOC~~ SOLR
240.0000 mg | Freq: Once | SUBCUTANEOUS | Status: AC
  Administered 2014-09-26: 240 mg via SUBCUTANEOUS
  Filled 2014-09-26: qty 6

## 2014-09-26 MED ORDER — METOPROLOL TARTRATE 25 MG PO TABS: 25.0000 mg | ORAL_TABLET | Freq: Two times a day (BID) | ORAL | Status: AC

## 2014-09-26 MED ORDER — LORAZEPAM 0.5 MG PO TABS: 0.5000 mg | ORAL_TABLET | Freq: Three times a day (TID) | ORAL | Status: AC

## 2014-09-26 MED ORDER — MORPHINE SULFATE (CONCENTRATE) 10 MG /0.5 ML PO SOLN: 4.0000 mg | ORAL | Status: AC | PRN

## 2014-09-26 MED ORDER — IPRATROPIUM-ALBUTEROL 0.5-2.5 (3) MG/3ML IN SOLN: 3.0000 mL | Freq: Two times a day (BID) | RESPIRATORY_TRACT | Status: AC

## 2014-09-26 NOTE — Progress Notes (Signed)
  Subjective: Patient reports : Back pain.  Objective: Vital signs in last 24 hours: Temp:  [97.7 F (36.5 C)-99.8 F (37.7 C)] 99.8 F (37.7 C) (01/21 0508) Pulse Rate:  [96-109] 96 (01/21 0508) Resp:  [16-20] 18 (01/21 0508) BP: (133-168)/(62-76) 153/62 mmHg (01/21 0508) SpO2:  [93 %-100 %] 97 % (01/21 0508) Weight:  [60 kg (132 lb 4.4 oz)] 60 kg (132 lb 4.4 oz) (01/21 0508)  Intake/Output from previous day: 01/20 0701 - 01/21 0700 In: 870 [IV Piggyback:350] Out: 1100 [Urine:1100] Intake/Output this shift: Total I/O In: -  Out: 600 [Urine:600]  Physical Exam:   GI: Abdomen: soft, non distended, non tender. Tenderness lumbo sacral area. Foley draining clear urine.  Lab Results:  Recent Labs  09/24/14 0540 09/25/14 0700  HGB 10.1* 9.7*  HCT 32.4* 31.8*   BMET  Recent Labs  09/24/14 0540 09/25/14 0700  NA 139 142  K 4.9 5.3*  CL 104 105  CO2 25 25  GLUCOSE 89 132*  BUN 47* 50*  CREATININE 2.29* 2.26*  CALCIUM 8.2* 8.8   No results for input(s): LABPT, INR in the last 72 hours. No results for input(s): LABURIN in the last 72 hours. Results for orders placed or performed during the hospital encounter of 09/22/14  Culture, Urine     Status: None (Preliminary result)   Collection Time: 09/23/14  9:46 PM  Result Value Ref Range Status   Specimen Description URINE, RANDOM  Final   Special Requests NONE  Final   Colony Count   Final    50,000 COLONIES/ML Performed at [facility]    Culture   Final    PROTEUS MIRABILIS Performed at [facility]    Report Status PENDING  Incomplete    Studies/Results: Dg Chest Port 1 View  09/24/2014   CLINICAL DATA:  Shortness of breath with productive cough and chest tightness.  EXAM: PORTABLE CHEST - 1 VIEW  COMPARISON:  09/23/2014 and 09/22/2014  FINDINGS: Lungs are adequately inflated and demonstrate minimal prominence of the perihilar markings and slight worsening opacification in the right  base/infrahilar region. Findings may be due to worsening mild interstitial edema although cannot exclude infection in the right base. No evidence of effusion or pneumothorax. Cardiomediastinal silhouette and remainder of the exam is unchanged.  IMPRESSION: Mild prominence of the perihilar markings and mild worsening opacification in the right base/ infrahilar region. Findings may be due to mild vascular congestion as cannot exclude infection in the right base.   Electronically Signed   By: Marin Olp M.D.   On: 09/24/2014 09:31    Assessment/Plan:  Metastatic prostate cancer  Start androgen deprivation with Firmagon 240 mgm S/Q.  Will continue with Lupron in one month.  OK to give him a voiding trial before discharge   LOS: 4 days   Arvil Persons 09/26/2014, 8:16 AM

## 2014-09-26 NOTE — Clinical Social Work Note (Signed)
Per MD patient ready to DC to [facility]. RN, patient/family, and facility notified of patient's DC. RN given number for report. DC packet on patient's chart. Ambulance transport requested for patient. CSW signing off at this time.   Liz Beach MSW, [place], [place], [phone]

## 2014-09-26 NOTE — Discharge Summary (Signed)
Physician Discharge Summary  SHAMERE CAMPAS EVO:[id number] DOB: 11/18/1918 DOA: 09/22/2014  PCP: Patricia Nettle, MD  Admit date: 09/22/2014 Discharge date: 09/26/2014  Recommendations for Outpatient Follow-up:  1. None 2. PATIENT IS GOING TO [facility] 3. Please maintain the patient on 2 L nasal cannula and titrate as needed for comfort  Discharge Diagnoses:  AKI (acute kidney injury)/Hydronephrosis, bilateral/Prostate cancer metastatic to multiple sites  Placed Foley catheter--successful on second attempt  Patient will get a peak in place with an indwelling Foley catheter as the focus of care has changed to full comfort  Received gentle fluids during the hospitalization  Renal function remained stable Hydronephrosis with AKI - Consult urology-- Dr. Janice Norrie is pt's urologist--appreciate his input--he did not recommend any surgical intervention at this time -pt received one time dose of Firmagon -Dr. Janice Norrie was informed that after discussion with the patient's family, they have decided to transition the patient care to full comfort and that the patient will be discharged to [facility] -09/22/14--CT abdomen and pelvis shows bilateral hydronephrosis with abrupt narrowing caused by contiguous prostate tissue  Goals of Care -had family meeting with wife and daughter at bedside -discussed palliative care and hospice and concepts -they have decided to make pt DNR with transition of care to comfort care -Pt's life expectancy likely <6 weeks -spoke with social work for possible d/c to [facility] -d/c further labs and xrays RLL opacity/Severe chronic obstructive pulmonary disease/chronic respiratory failure  - ?PNA vs metastasis  Ceftriaxone/Zithromax were started during the hospitalization, but this will be discontinued at the time of discharge  Continue bronchodilators  D/c dexamethasone Coronary atherosclerosis/Atrial fibrillation/NSVT  Check  magnesium level--2.3   repeat EKG--showed sinus rhythm with PVCs  Appreciate cardiology recommendations--no additional testing or intervention at this time--patient was started on low-dose metoprolol 25 mg twice a day  Otherwise Continue current management  Continue BB per cardiology Candida rash of groin  Wound care consult  Nystatin powder Left hip pain/L rib pain  Due to metastases.  Narcotics as needed HTN -non-essential medications were discontinued -metoprolol was started for symptomatic therapy for his NSVT Hypoalbuminemia due to severe protein-calorie malnutrition  Status post PEG  Continue tube feeds  Po comfort feeds  Discharge Condition: stable  Disposition: [place]  Diet:PEG feeds with oral comfort feeds Wt Readings from Last 3 Encounters:  09/26/14 60 kg (132 lb 4.4 oz)  01/25/14 63.141 kg (139 lb 3.2 oz)  08/27/13 63.322 kg (139 lb 9.6 oz)    History of present illness:  81 y.o. male with Untreated Prostate Cancer (07/2013), O2 dependent COPD, Hx of Necrotizing Pneumonia, Atrial Fibrillation CAD, and HTN who presents to the ED with complaints of severe RLQ ABD pain and Flank Pain since the afternoon. He has had increased weakness over the past week, and has remained in the bed for the past few days prior to admission. He denies andy fevers or chills or chest pain. A CT scan of the ABD and Pelvis was performed and revealed an increased Prostate Cancer tumor burden with metastasis to the spine and bony pelvis with bilateral Hydronephrosis. His labs also reveal an elevated BUN/Cr of 55/2.50 and Hyperkalemia at 5.8.  The patient was started on intravenous antibiotics for right lower lobe opacity concerning for possible pneumonia. He was maintained on bronchodilators and supplemental oxygen. Gastrostomy tube feedings were continued with pleasure feeding orally. CT of the abdomen and pelvis showed bilateral hydronephrosis which was thought to be chronic  when  seen by urology. Dr. Janice Norrie, urology, was consulted and saw the patient. The patient did receive a one-time dose of Firmagon. Dr. Janice Norrie was informed of the patient's status of transition to a focus of comfort care. The patient was also seen by cardiology due to his ectopy. There was concern for nonsustained VTach.  Cardiology recommended symptomatically therapy with metoprolol tartrate. No further testing was recommended. Family meeting was held with the patient's daughter and wife. Goals of care were discussed. It was agreed upon to transition the patient's care to focus all full comfort. The patient's family agreed to residential hospice and with the assistance of case management, a place was found to be available at [facility]. The patient's nonessential medications were discontinued. He was continued on opioids for his pain and benzodiazepines for his anxiety.  Consultants: Urology--Dr. Janice Norrie  Discharge Exam: Filed Vitals:   09/26/14 0919  BP: 138/66  Pulse: 103  Temp:   Resp:    Filed Vitals:   09/25/14 2255 09/26/14 0508 09/26/14 0854 09/26/14 0919  BP: 133/72 153/62  138/66  Pulse: 109 96  103  Temp: 98.1 F (36.7 C) 99.8 F (37.7 C)    TempSrc: Oral Oral    Resp: 16 18    Height:      Weight:  60 kg (132 lb 4.4 oz)    SpO2: 93% 97% 96%    General: A&O x 3, NAD, pleasant, cooperative Cardiovascular: RRR, no rub, no gallop, no S3 Respiratory: Diminished breath sounds. Bibasilar crackles. No wheezing. Abdomen:soft, nontender, nondistended, positive bowel sounds Extremities: No edema, No lymphangitis, no petechiae  Discharge Instructions     Medication List    STOP taking these medications        aspirin 81 MG tablet     esomeprazole 40 MG capsule  Commonly known as:  NEXIUM     losartan 50 MG tablet  Commonly known as:  COZAAR      TAKE these medications        amLODipine 5 MG tablet  Commonly known as:  NORVASC  Place 1 tablet (5 mg total) into feeding  tube daily.     budesonide 0.5 MG/2ML nebulizer solution  Commonly known as:  PULMICORT  Take 0.5 mg by nebulization 2 (two) times daily. For shortness of breath     ipratropium-albuterol 0.5-2.5 (3) MG/3ML Soln  Commonly known as:  DUONEB  Take 3 mLs by nebulization 2 (two) times daily.     JEVITY PLUS Liqd  300 mLs by PEG Tube route 3 (three) times daily. 314ml at 7am, 12pm, 5pm. Each time tube flushes 165ml past tube feeds     LORazepam 0.5 MG tablet  Commonly known as:  ATIVAN  Take 1 tablet (0.5 mg total) by mouth every 8 (eight) hours.     metoprolol tartrate 25 MG tablet  Commonly known as:  LOPRESSOR  Take 1 tablet (25 mg total) by mouth 2 (two) times daily.     morphine CONCENTRATE 10 mg / 0.5 ml concentrated solution  Take 0.2 mLs (4 mg total) by mouth every 2 (two) hours as needed for moderate pain, severe pain, anxiety or shortness of breath.     silodosin 8 MG Caps capsule  Commonly known as:  RAPAFLO  8 mg by PEG Tube route daily with breakfast.     SLOW FE 160 (50 FE) MG Tbcr SR tablet  Generic drug:  ferrous sulfate  1 tablet by PEG Tube route 2 (two) times daily.  The results of significant diagnostics from this hospitalization (including imaging, microbiology, ancillary and laboratory) are listed below for reference.    Significant Diagnostic Studies: Dg Chest 1 View  09/23/2014   CLINICAL DATA:  Cough  EXAM: CHEST - 1 VIEW  COMPARISON:  09/22/2014  FINDINGS: Cardiomediastinal silhouette is stable. Persistent streaky right basilar atelectasis or infiltrate. Minimal left basilar atelectasis. No pulmonary edema.  IMPRESSION: Persistent streaky right basilar atelectasis or infiltrate. Pneumonia cannot be excluded. Follow-up to resolution is recommended.   Electronically Signed   By: Lahoma Crocker M.D.   On: 09/23/2014 09:33   Dg Chest 2 View  09/22/2014   CLINICAL DATA:  Generalized body aches beginning this morning. Shortness of breath, cough for several  days. History of hypertension and COPD.  EXAM: CHEST  2 VIEW  COMPARISON:  03/14/2013  FINDINGS: There is hyperinflation of the lungs compatible with COPD. Heart is borderline in size. Rounded opacity noted at the right lung base, not well visualized on the lateral view. Left lung is clear. No effusions. No acute bony abnormality.  IMPRESSION: COPD.  Rounded opacity at the right lung base. Cannot exclude developing pneumonia.   Electronically Signed   By: Rolm Baptise M.D.   On: 09/22/2014 21:09   Ct Abdomen Pelvis W Contrast  09/22/2014   CLINICAL DATA:  Abdominal pain, site unspecified.  EXAM: CT ABDOMEN AND PELVIS WITH CONTRAST  TECHNIQUE: Multidetector CT imaging of the abdomen and pelvis was performed using the standard protocol following bolus administration of intravenous contrast.  CONTRAST:  1 OMNIPAQUE IOHEXOL 300 MG/ML SOLN, 13mL OMNIPAQUE IOHEXOL 300 MG/ML SOLN  COMPARISON:  PET-CT 11/07/2009  FINDINGS: BODY WALL: Unremarkable.  LOWER CHEST: Advanced emphysema in the lower lungs.  ABDOMEN/PELVIS:  Liver: Scattered, circumscribed low-density lesions are most compatible with cysts. No enhancing lesions suspected.  Biliary: No evidence of biliary obstruction or stone.  Pancreas: Unremarkable.  Spleen: Unremarkable.  Adrenals: Unremarkable.  Kidneys and ureters: Bilateral hydroureteronephrosis to the level of the bilateral lower ureters where there is abrupt narrowing of the ureters by enhancing tissue that is contiguous with the prostate.  Bladder: Moderately distended. Lobulated tissue along the posterior wall is likely prostatic.  Reproductive: There is abnormal lobulated enlargement of the prostate gland with extracapsular soft tissue infiltrating the rectoprostatic angle. Enhancing material is contiguous in the lower ureters bilaterally. Bilateral pelvic and periaortic lymphadenopathy with nodes measuring up to 16 mm short axis below the left renal hilum (image 33).  Bowel: No obstruction. Negative  appendix. Percutaneous gastrostomy tube which is in good position.  Retroperitoneum: No mass or adenopathy.  Peritoneum: No ascites or pneumoperitoneum.  Vascular: Extensive atherosclerosis. The patient is status post aortobiiliac grafting. Graft is patent. Left pelvic adenopathy narrows the left external iliac vein, without visible thrombosis. Notable plaque present in the proximal SMA.  OSSEOUS: New sclerotic lesions throughout the bony pelvis and spine. No pathologic fracture or definite extra osseous extension.  IMPRESSION: Findings of prostate cancer with osseous and nodal metastases. There is bilateral hydroureteronephrosis from tumoral obstruction of the distal ureters.   Electronically Signed   By: Jorje Guild M.D.   On: 09/22/2014 21:50   Dg Chest Port 1 View  09/24/2014   CLINICAL DATA:  Shortness of breath with productive cough and chest tightness.  EXAM: PORTABLE CHEST - 1 VIEW  COMPARISON:  09/23/2014 and 09/22/2014  FINDINGS: Lungs are adequately inflated and demonstrate minimal prominence of the perihilar markings and slight worsening opacification in the right base/infrahilar  region. Findings may be due to worsening mild interstitial edema although cannot exclude infection in the right base. No evidence of effusion or pneumothorax. Cardiomediastinal silhouette and remainder of the exam is unchanged.  IMPRESSION: Mild prominence of the perihilar markings and mild worsening opacification in the right base/ infrahilar region. Findings may be due to mild vascular congestion as cannot exclude infection in the right base.   Electronically Signed   By: Marin Olp M.D.   On: 09/24/2014 09:31     Microbiology: Recent Results (from the past 240 hour(s))  Culture, Urine     Status: None (Preliminary result)   Collection Time: 09/23/14  9:46 PM  Result Value Ref Range Status   Specimen Description URINE, RANDOM  Final   Special Requests NONE  Final   Colony Count   Final    50,000  COLONIES/ML Performed at [facility]    Culture   Final    PROTEUS MIRABILIS Performed at [facility]    Report Status PENDING  Incomplete     Labs: Basic Metabolic Panel:  Recent Labs Lab 09/22/14 2020 09/23/14 0702 09/24/14 0540 09/25/14 0700  NA 139 135 139 142  K 5.8* 5.1 4.9 5.3*  CL 104 102 104 105  CO2 24 23 25 25   GLUCOSE 94 103* 89 132*  BUN 55* 48* 47* 50*  CREATININE 2.50* 2.29* 2.29* 2.26*  CALCIUM 8.7 8.2* 8.2* 8.8  MG  --   --  2.2 2.3  PHOS  --   --   --  3.9   Liver Function Tests:  Recent Labs Lab 09/22/14 2020 09/24/14 0540 09/25/14 0700  AST 32 27 42*  ALT 32 27 26  ALKPHOS 107 99 117  BILITOT 0.6 0.5 0.4  PROT 7.2 6.5 6.4  ALBUMIN 2.8* 2.4* 2.4*   No results for input(s): LIPASE, AMYLASE in the last 168 hours. No results for input(s): AMMONIA in the last 168 hours. CBC:  Recent Labs Lab 09/22/14 2020 09/23/14 0702 09/24/14 0540 09/25/14 0700  WBC 8.4 6.1 6.5 8.7  NEUTROABS 7.1  --   --   --   HGB 10.4* 9.1* 10.1* 9.7*  HCT 33.2* 29.0* 32.4* 31.8*  MCV 90.5 90.6 91.3 91.6  PLT 262 255 284 304   Cardiac Enzymes: No results for input(s): CKTOTAL, CKMB, CKMBINDEX, TROPONINI in the last 168 hours. BNP: Invalid input(s): POCBNP CBG:  Recent Labs Lab 09/25/14 0756 09/25/14 1208 09/25/14 1732 09/25/14 2102 09/26/14 0805  GLUCAP 135* 135* 113* 110* 117*    Time coordinating discharge:  Greater than 30 minutes  Signed:  Alivya Wegman, DO [facility] Pager: [phone] 09/26/2014, 11:59 AM

## 2014-09-26 NOTE — Plan of Care (Signed)
Problem: Phase II Progression Outcomes Goal: Obtain order to discontinue catheter if appropriate Outcome: Not Applicable Date Met:  47/34/03 Foley placed for End of Life. To be D/Ced to hospice with Foley  Problem: Phase III Progression Outcomes Goal: Foley discontinued Outcome: Not Applicable Date Met:  70/96/43 Foley in place for End of Life. To be D/Ced to Hospice with Foley

## 2014-09-26 NOTE — Progress Notes (Signed)
Pt prepared for d/c to [facility]. IV d/c'd. Skin intact except as most recently charted. Vitals are stable. Foley catheter left in per MD. Report called to receiving facility. Pt to be transported by ambulance service.

## 2014-09-27 LAB — URINE CULTURE: Colony Count: 50000

## 2014-10-07 DEATH — deceased
